# Patient Record
Sex: Male | Born: 1945 | Race: White | Hispanic: No | Smoking: Former smoker
Health system: Southern US, Community
[De-identification: ages and names within clinical notes are randomized; demographics above are authoritative.]

## PROBLEM LIST (undated history)

## (undated) DIAGNOSIS — K219 Gastro-esophageal reflux disease without esophagitis: Secondary | ICD-10-CM

## (undated) DIAGNOSIS — Z8739 Personal history of other diseases of the musculoskeletal system and connective tissue: Secondary | ICD-10-CM

## (undated) DIAGNOSIS — I209 Angina pectoris, unspecified: Secondary | ICD-10-CM

## (undated) DIAGNOSIS — R079 Chest pain, unspecified: Secondary | ICD-10-CM

## (undated) DIAGNOSIS — I2510 Atherosclerotic heart disease of native coronary artery without angina pectoris: Secondary | ICD-10-CM

## (undated) DIAGNOSIS — H3530 Unspecified macular degeneration: Secondary | ICD-10-CM

## (undated) DIAGNOSIS — E7800 Pure hypercholesterolemia, unspecified: Secondary | ICD-10-CM

## (undated) DIAGNOSIS — R0600 Dyspnea, unspecified: Secondary | ICD-10-CM

## (undated) DIAGNOSIS — I314 Cardiac tamponade: Secondary | ICD-10-CM

## (undated) DIAGNOSIS — I214 Non-ST elevation (NSTEMI) myocardial infarction: Secondary | ICD-10-CM

## (undated) DIAGNOSIS — I10 Essential (primary) hypertension: Secondary | ICD-10-CM

## (undated) DIAGNOSIS — I313 Pericardial effusion (noninflammatory): Secondary | ICD-10-CM

## (undated) HISTORY — DX: Cardiac tamponade: I31.4

## (undated) HISTORY — PX: CATARACT EXTRACTION W/ INTRAOCULAR LENS  IMPLANT, BILATERAL: SHX1307

## (undated) HISTORY — PX: CORONARY ANGIOPLASTY: SHX604

---

## 2011-09-20 DIAGNOSIS — R1011 Right upper quadrant pain: Secondary | ICD-10-CM | POA: Diagnosis not present

## 2011-09-20 DIAGNOSIS — R0789 Other chest pain: Secondary | ICD-10-CM | POA: Diagnosis not present

## 2011-09-20 DIAGNOSIS — Z1389 Encounter for screening for other disorder: Secondary | ICD-10-CM | POA: Diagnosis not present

## 2011-09-20 DIAGNOSIS — Z13220 Encounter for screening for lipoid disorders: Secondary | ICD-10-CM | POA: Diagnosis not present

## 2011-09-20 DIAGNOSIS — I10 Essential (primary) hypertension: Secondary | ICD-10-CM | POA: Diagnosis not present

## 2011-09-24 DIAGNOSIS — R1011 Right upper quadrant pain: Secondary | ICD-10-CM | POA: Diagnosis not present

## 2011-10-14 DIAGNOSIS — R1031 Right lower quadrant pain: Secondary | ICD-10-CM | POA: Diagnosis not present

## 2011-10-14 DIAGNOSIS — Z1211 Encounter for screening for malignant neoplasm of colon: Secondary | ICD-10-CM | POA: Diagnosis not present

## 2011-11-05 DIAGNOSIS — Z1211 Encounter for screening for malignant neoplasm of colon: Secondary | ICD-10-CM | POA: Diagnosis not present

## 2011-11-05 DIAGNOSIS — K7689 Other specified diseases of liver: Secondary | ICD-10-CM | POA: Diagnosis not present

## 2011-11-05 DIAGNOSIS — Z87891 Personal history of nicotine dependence: Secondary | ICD-10-CM | POA: Diagnosis not present

## 2011-11-05 DIAGNOSIS — R933 Abnormal findings on diagnostic imaging of other parts of digestive tract: Secondary | ICD-10-CM | POA: Diagnosis not present

## 2011-11-05 DIAGNOSIS — I10 Essential (primary) hypertension: Secondary | ICD-10-CM | POA: Diagnosis not present

## 2011-11-05 DIAGNOSIS — R935 Abnormal findings on diagnostic imaging of other abdominal regions, including retroperitoneum: Secondary | ICD-10-CM | POA: Diagnosis not present

## 2011-11-05 DIAGNOSIS — R945 Abnormal results of liver function studies: Secondary | ICD-10-CM | POA: Diagnosis not present

## 2011-11-05 DIAGNOSIS — Z79899 Other long term (current) drug therapy: Secondary | ICD-10-CM | POA: Diagnosis not present

## 2011-11-05 DIAGNOSIS — E7800 Pure hypercholesterolemia, unspecified: Secondary | ICD-10-CM | POA: Diagnosis not present

## 2011-11-05 DIAGNOSIS — K5730 Diverticulosis of large intestine without perforation or abscess without bleeding: Secondary | ICD-10-CM | POA: Diagnosis not present

## 2011-12-19 DIAGNOSIS — E782 Mixed hyperlipidemia: Secondary | ICD-10-CM | POA: Diagnosis not present

## 2011-12-19 DIAGNOSIS — J449 Chronic obstructive pulmonary disease, unspecified: Secondary | ICD-10-CM | POA: Diagnosis not present

## 2011-12-19 DIAGNOSIS — Z23 Encounter for immunization: Secondary | ICD-10-CM | POA: Diagnosis not present

## 2011-12-19 DIAGNOSIS — Z1389 Encounter for screening for other disorder: Secondary | ICD-10-CM | POA: Diagnosis not present

## 2011-12-19 DIAGNOSIS — I10 Essential (primary) hypertension: Secondary | ICD-10-CM | POA: Diagnosis not present

## 2012-01-01 LAB — HM COLONOSCOPY

## 2012-03-25 DIAGNOSIS — I10 Essential (primary) hypertension: Secondary | ICD-10-CM | POA: Diagnosis not present

## 2012-03-25 DIAGNOSIS — J449 Chronic obstructive pulmonary disease, unspecified: Secondary | ICD-10-CM | POA: Diagnosis not present

## 2012-03-25 DIAGNOSIS — Z1389 Encounter for screening for other disorder: Secondary | ICD-10-CM | POA: Diagnosis not present

## 2012-03-25 DIAGNOSIS — E782 Mixed hyperlipidemia: Secondary | ICD-10-CM | POA: Diagnosis not present

## 2012-03-26 DIAGNOSIS — E782 Mixed hyperlipidemia: Secondary | ICD-10-CM | POA: Diagnosis not present

## 2012-03-26 DIAGNOSIS — Z1389 Encounter for screening for other disorder: Secondary | ICD-10-CM | POA: Diagnosis not present

## 2012-03-26 DIAGNOSIS — I10 Essential (primary) hypertension: Secondary | ICD-10-CM | POA: Diagnosis not present

## 2012-03-26 DIAGNOSIS — J449 Chronic obstructive pulmonary disease, unspecified: Secondary | ICD-10-CM | POA: Diagnosis not present

## 2012-05-18 DIAGNOSIS — K5730 Diverticulosis of large intestine without perforation or abscess without bleeding: Secondary | ICD-10-CM | POA: Diagnosis not present

## 2012-07-09 DIAGNOSIS — J449 Chronic obstructive pulmonary disease, unspecified: Secondary | ICD-10-CM | POA: Diagnosis not present

## 2012-07-09 DIAGNOSIS — Z23 Encounter for immunization: Secondary | ICD-10-CM | POA: Diagnosis not present

## 2012-07-09 DIAGNOSIS — E782 Mixed hyperlipidemia: Secondary | ICD-10-CM | POA: Diagnosis not present

## 2012-07-09 DIAGNOSIS — I10 Essential (primary) hypertension: Secondary | ICD-10-CM | POA: Diagnosis not present

## 2013-01-05 DIAGNOSIS — I10 Essential (primary) hypertension: Secondary | ICD-10-CM | POA: Diagnosis not present

## 2013-01-05 DIAGNOSIS — E785 Hyperlipidemia, unspecified: Secondary | ICD-10-CM | POA: Diagnosis not present

## 2013-01-05 DIAGNOSIS — J449 Chronic obstructive pulmonary disease, unspecified: Secondary | ICD-10-CM | POA: Diagnosis not present

## 2013-01-06 DIAGNOSIS — H2700 Aphakia, unspecified eye: Secondary | ICD-10-CM | POA: Diagnosis not present

## 2013-01-13 DIAGNOSIS — E875 Hyperkalemia: Secondary | ICD-10-CM | POA: Diagnosis not present

## 2013-01-13 DIAGNOSIS — I10 Essential (primary) hypertension: Secondary | ICD-10-CM | POA: Diagnosis not present

## 2013-01-27 DIAGNOSIS — E875 Hyperkalemia: Secondary | ICD-10-CM | POA: Diagnosis not present

## 2013-02-11 DIAGNOSIS — I10 Essential (primary) hypertension: Secondary | ICD-10-CM | POA: Diagnosis not present

## 2013-02-11 DIAGNOSIS — M109 Gout, unspecified: Secondary | ICD-10-CM | POA: Diagnosis not present

## 2013-03-15 DIAGNOSIS — M109 Gout, unspecified: Secondary | ICD-10-CM | POA: Diagnosis not present

## 2013-03-15 DIAGNOSIS — I10 Essential (primary) hypertension: Secondary | ICD-10-CM | POA: Diagnosis not present

## 2013-05-10 DIAGNOSIS — I10 Essential (primary) hypertension: Secondary | ICD-10-CM | POA: Diagnosis not present

## 2013-05-10 DIAGNOSIS — E785 Hyperlipidemia, unspecified: Secondary | ICD-10-CM | POA: Diagnosis not present

## 2013-05-10 DIAGNOSIS — J449 Chronic obstructive pulmonary disease, unspecified: Secondary | ICD-10-CM | POA: Diagnosis not present

## 2013-05-10 DIAGNOSIS — M109 Gout, unspecified: Secondary | ICD-10-CM | POA: Diagnosis not present

## 2013-05-11 DIAGNOSIS — Z23 Encounter for immunization: Secondary | ICD-10-CM | POA: Diagnosis not present

## 2013-08-16 DIAGNOSIS — J029 Acute pharyngitis, unspecified: Secondary | ICD-10-CM | POA: Diagnosis not present

## 2013-11-08 DIAGNOSIS — I10 Essential (primary) hypertension: Secondary | ICD-10-CM | POA: Diagnosis not present

## 2013-11-08 DIAGNOSIS — E785 Hyperlipidemia, unspecified: Secondary | ICD-10-CM | POA: Diagnosis not present

## 2013-11-08 DIAGNOSIS — N400 Enlarged prostate without lower urinary tract symptoms: Secondary | ICD-10-CM | POA: Diagnosis not present

## 2013-11-08 DIAGNOSIS — M109 Gout, unspecified: Secondary | ICD-10-CM | POA: Diagnosis not present

## 2013-11-08 DIAGNOSIS — J449 Chronic obstructive pulmonary disease, unspecified: Secondary | ICD-10-CM | POA: Diagnosis not present

## 2013-11-15 DIAGNOSIS — E875 Hyperkalemia: Secondary | ICD-10-CM | POA: Diagnosis not present

## 2014-03-10 DIAGNOSIS — N400 Enlarged prostate without lower urinary tract symptoms: Secondary | ICD-10-CM | POA: Diagnosis not present

## 2014-03-10 DIAGNOSIS — E785 Hyperlipidemia, unspecified: Secondary | ICD-10-CM | POA: Diagnosis not present

## 2014-03-10 DIAGNOSIS — Z6832 Body mass index (BMI) 32.0-32.9, adult: Secondary | ICD-10-CM | POA: Diagnosis not present

## 2014-03-10 DIAGNOSIS — I10 Essential (primary) hypertension: Secondary | ICD-10-CM | POA: Diagnosis not present

## 2014-03-10 DIAGNOSIS — J449 Chronic obstructive pulmonary disease, unspecified: Secondary | ICD-10-CM | POA: Diagnosis not present

## 2014-03-16 DIAGNOSIS — Z23 Encounter for immunization: Secondary | ICD-10-CM | POA: Diagnosis not present

## 2014-08-17 DIAGNOSIS — H2703 Aphakia, bilateral: Secondary | ICD-10-CM | POA: Diagnosis not present

## 2014-09-06 DIAGNOSIS — I10 Essential (primary) hypertension: Secondary | ICD-10-CM | POA: Diagnosis not present

## 2014-09-06 DIAGNOSIS — E785 Hyperlipidemia, unspecified: Secondary | ICD-10-CM | POA: Diagnosis not present

## 2014-09-06 DIAGNOSIS — M109 Gout, unspecified: Secondary | ICD-10-CM | POA: Diagnosis not present

## 2014-09-06 DIAGNOSIS — Z6832 Body mass index (BMI) 32.0-32.9, adult: Secondary | ICD-10-CM | POA: Diagnosis not present

## 2014-09-06 DIAGNOSIS — J449 Chronic obstructive pulmonary disease, unspecified: Secondary | ICD-10-CM | POA: Diagnosis not present

## 2014-09-14 DIAGNOSIS — I10 Essential (primary) hypertension: Secondary | ICD-10-CM | POA: Diagnosis not present

## 2014-09-14 DIAGNOSIS — E785 Hyperlipidemia, unspecified: Secondary | ICD-10-CM | POA: Diagnosis not present

## 2014-09-14 DIAGNOSIS — R079 Chest pain, unspecified: Secondary | ICD-10-CM | POA: Diagnosis not present

## 2014-09-14 DIAGNOSIS — I209 Angina pectoris, unspecified: Secondary | ICD-10-CM | POA: Diagnosis not present

## 2014-09-14 DIAGNOSIS — Z87891 Personal history of nicotine dependence: Secondary | ICD-10-CM | POA: Diagnosis not present

## 2014-10-11 DIAGNOSIS — R079 Chest pain, unspecified: Secondary | ICD-10-CM | POA: Diagnosis not present

## 2014-10-11 DIAGNOSIS — R0602 Shortness of breath: Secondary | ICD-10-CM | POA: Diagnosis not present

## 2014-10-18 DIAGNOSIS — E785 Hyperlipidemia, unspecified: Secondary | ICD-10-CM | POA: Diagnosis not present

## 2014-10-18 DIAGNOSIS — R079 Chest pain, unspecified: Secondary | ICD-10-CM | POA: Diagnosis not present

## 2014-10-18 DIAGNOSIS — I209 Angina pectoris, unspecified: Secondary | ICD-10-CM | POA: Diagnosis not present

## 2015-01-13 DIAGNOSIS — E785 Hyperlipidemia, unspecified: Secondary | ICD-10-CM | POA: Diagnosis not present

## 2015-01-13 DIAGNOSIS — Z6832 Body mass index (BMI) 32.0-32.9, adult: Secondary | ICD-10-CM | POA: Diagnosis not present

## 2015-01-13 DIAGNOSIS — I10 Essential (primary) hypertension: Secondary | ICD-10-CM | POA: Diagnosis not present

## 2015-07-12 DIAGNOSIS — I10 Essential (primary) hypertension: Secondary | ICD-10-CM | POA: Diagnosis not present

## 2015-07-12 DIAGNOSIS — Z23 Encounter for immunization: Secondary | ICD-10-CM | POA: Diagnosis not present

## 2015-07-12 DIAGNOSIS — E785 Hyperlipidemia, unspecified: Secondary | ICD-10-CM | POA: Diagnosis not present

## 2015-07-12 DIAGNOSIS — Z0000 Encounter for general adult medical examination without abnormal findings: Secondary | ICD-10-CM | POA: Diagnosis not present

## 2015-07-12 DIAGNOSIS — Z6832 Body mass index (BMI) 32.0-32.9, adult: Secondary | ICD-10-CM | POA: Diagnosis not present

## 2015-07-19 DIAGNOSIS — E875 Hyperkalemia: Secondary | ICD-10-CM | POA: Diagnosis not present

## 2015-07-19 DIAGNOSIS — Z1211 Encounter for screening for malignant neoplasm of colon: Secondary | ICD-10-CM | POA: Diagnosis not present

## 2015-08-18 DIAGNOSIS — H2703 Aphakia, bilateral: Secondary | ICD-10-CM | POA: Diagnosis not present

## 2015-10-13 DIAGNOSIS — Z6833 Body mass index (BMI) 33.0-33.9, adult: Secondary | ICD-10-CM | POA: Diagnosis not present

## 2015-10-13 DIAGNOSIS — J019 Acute sinusitis, unspecified: Secondary | ICD-10-CM | POA: Diagnosis not present

## 2016-01-10 DIAGNOSIS — E785 Hyperlipidemia, unspecified: Secondary | ICD-10-CM | POA: Diagnosis not present

## 2016-01-10 DIAGNOSIS — M109 Gout, unspecified: Secondary | ICD-10-CM | POA: Diagnosis not present

## 2016-01-10 DIAGNOSIS — N400 Enlarged prostate without lower urinary tract symptoms: Secondary | ICD-10-CM | POA: Diagnosis not present

## 2016-01-10 DIAGNOSIS — I10 Essential (primary) hypertension: Secondary | ICD-10-CM | POA: Diagnosis not present

## 2016-01-10 DIAGNOSIS — Z6830 Body mass index (BMI) 30.0-30.9, adult: Secondary | ICD-10-CM | POA: Diagnosis not present

## 2016-01-10 DIAGNOSIS — J449 Chronic obstructive pulmonary disease, unspecified: Secondary | ICD-10-CM | POA: Diagnosis not present

## 2016-07-09 DIAGNOSIS — E785 Hyperlipidemia, unspecified: Secondary | ICD-10-CM | POA: Diagnosis not present

## 2016-07-09 DIAGNOSIS — I10 Essential (primary) hypertension: Secondary | ICD-10-CM | POA: Diagnosis not present

## 2016-07-09 DIAGNOSIS — N400 Enlarged prostate without lower urinary tract symptoms: Secondary | ICD-10-CM | POA: Diagnosis not present

## 2016-07-10 DIAGNOSIS — Z0000 Encounter for general adult medical examination without abnormal findings: Secondary | ICD-10-CM | POA: Diagnosis not present

## 2016-07-10 DIAGNOSIS — M109 Gout, unspecified: Secondary | ICD-10-CM | POA: Diagnosis not present

## 2016-07-10 DIAGNOSIS — Z23 Encounter for immunization: Secondary | ICD-10-CM | POA: Diagnosis not present

## 2016-07-10 DIAGNOSIS — E782 Mixed hyperlipidemia: Secondary | ICD-10-CM | POA: Diagnosis not present

## 2016-07-10 DIAGNOSIS — N400 Enlarged prostate without lower urinary tract symptoms: Secondary | ICD-10-CM | POA: Diagnosis not present

## 2016-07-10 DIAGNOSIS — I10 Essential (primary) hypertension: Secondary | ICD-10-CM | POA: Diagnosis not present

## 2016-10-07 DIAGNOSIS — H2703 Aphakia, bilateral: Secondary | ICD-10-CM | POA: Diagnosis not present

## 2016-10-07 DIAGNOSIS — H3530 Unspecified macular degeneration: Secondary | ICD-10-CM | POA: Diagnosis not present

## 2016-10-31 DIAGNOSIS — H353132 Nonexudative age-related macular degeneration, bilateral, intermediate dry stage: Secondary | ICD-10-CM | POA: Diagnosis not present

## 2017-01-03 DIAGNOSIS — N400 Enlarged prostate without lower urinary tract symptoms: Secondary | ICD-10-CM | POA: Diagnosis not present

## 2017-01-03 DIAGNOSIS — I10 Essential (primary) hypertension: Secondary | ICD-10-CM | POA: Diagnosis not present

## 2017-01-03 DIAGNOSIS — E785 Hyperlipidemia, unspecified: Secondary | ICD-10-CM | POA: Diagnosis not present

## 2017-01-07 DIAGNOSIS — I10 Essential (primary) hypertension: Secondary | ICD-10-CM | POA: Diagnosis not present

## 2017-01-07 DIAGNOSIS — M1A00X0 Idiopathic chronic gout, unspecified site, without tophus (tophi): Secondary | ICD-10-CM | POA: Diagnosis not present

## 2017-01-07 DIAGNOSIS — J449 Chronic obstructive pulmonary disease, unspecified: Secondary | ICD-10-CM | POA: Diagnosis not present

## 2017-01-07 DIAGNOSIS — K219 Gastro-esophageal reflux disease without esophagitis: Secondary | ICD-10-CM | POA: Diagnosis not present

## 2017-01-07 DIAGNOSIS — E782 Mixed hyperlipidemia: Secondary | ICD-10-CM | POA: Diagnosis not present

## 2017-01-07 DIAGNOSIS — R072 Precordial pain: Secondary | ICD-10-CM | POA: Diagnosis not present

## 2017-01-30 DIAGNOSIS — H353132 Nonexudative age-related macular degeneration, bilateral, intermediate dry stage: Secondary | ICD-10-CM | POA: Diagnosis not present

## 2017-02-05 ENCOUNTER — Ambulatory Visit (INDEPENDENT_AMBULATORY_CARE_PROVIDER_SITE_OTHER): Payer: Medicare Other | Admitting: Cardiovascular Disease

## 2017-02-05 ENCOUNTER — Encounter: Payer: Self-pay | Admitting: Cardiovascular Disease

## 2017-02-05 VITALS — BP 162/87 | HR 65 | Ht 71.0 in | Wt 235.2 lb

## 2017-02-05 DIAGNOSIS — R0609 Other forms of dyspnea: Secondary | ICD-10-CM | POA: Diagnosis not present

## 2017-02-05 DIAGNOSIS — R079 Chest pain, unspecified: Secondary | ICD-10-CM | POA: Diagnosis not present

## 2017-02-05 DIAGNOSIS — I10 Essential (primary) hypertension: Secondary | ICD-10-CM | POA: Diagnosis not present

## 2017-02-05 DIAGNOSIS — I209 Angina pectoris, unspecified: Secondary | ICD-10-CM | POA: Insufficient documentation

## 2017-02-05 HISTORY — DX: Other forms of dyspnea: R06.09

## 2017-02-05 MED ORDER — LISINOPRIL 5 MG PO TABS: 5.0000 mg | ORAL_TABLET | Freq: Every day | ORAL | 6 refills | Status: DC

## 2017-02-05 NOTE — Assessment & Plan Note (Signed)
History of essential hypertension with blood pressure today 162/87. He is on metoprolol. He says his blood pressure usually runs in this range. I am going add lisinopril 5 mg a day.

## 2017-02-05 NOTE — Assessment & Plan Note (Signed)
History of progressive dyspnea on exertion over the last year. He is nonsmoker. We'll check a 2-D echocardiogram

## 2017-02-05 NOTE — Patient Instructions (Signed)
Medication Instructions: START Lisinopril 5 mg daily.   Labwork: I will call Dr. Blanch Media office to request your recent labs.  Testing/Procedures: Your physician has requested that you have an echocardiogram. Echocardiography is a painless test that uses sound waves to create images of your heart. It provides your doctor with information about the size and shape of your heart and how well your heart's chambers and valves are working. This procedure takes approximately one hour. There are no restrictions for this procedure.  Your physician has requested that you have a lexiscan myoview. For further information please visit [email or web address]. Please follow instruction sheet, as given.  Follow-Up: Your physician recommends that you schedule a follow-up appointment with Dr. Gwenlyn Found after testing.  If you need a refill on your cardiac medications before your next appointment, please call your pharmacy.

## 2017-02-05 NOTE — Progress Notes (Signed)
02/05/2017 Joseph Mercado   [birth year]  [id number]  Primary Physician Abigail Miyamoto, MD Primary Cardiologist: Runell Gess MD Roseanne Reno  HPI:  Joseph Mercado is a very pleasant 71 year old moderately overweight divorced Caucasian male father of one child who is accompanied by his friend Joseph Mercado today. He was referred by Dr. Marina Goodell in [facility] for cardiovascular evaluation because of chest pain and shortness of breath. His risk factors include family history a brother who had bypass surgery age 50 and treated hypertension. He quit smoking 35 years ago. He is retired Music therapist. He did have a stress test 2 years ago which apparently was normal. Over the last 2 years she had progressive dyspnea and exertional chest pain as well as nocturnal chest pain.   Current Outpatient Prescriptions  Medication Sig Dispense Refill  . metoprolol tartrate (LOPRESSOR) 50 MG tablet     . nitroGLYCERIN (NITROSTAT) 0.4 MG SL tablet     . pantoprazole (PROTONIX) 40 MG tablet      No current facility-administered medications for this visit.     Not on File  Social History   Social History  . Marital status: Single    Spouse name: N/A  . Number of children: N/A  . Years of education: N/A   Occupational History  . Not on file.   Social History Main Topics  . Smoking status: Never Smoker  . Smokeless tobacco: Never Used  . Alcohol use Not on file  . Drug use: Unknown  . Sexual activity: Not on file   Other Topics Concern  . Not on file   Social History Narrative  . No narrative on file     Review of Systems: General: negative for chills, fever, night sweats or weight changes.  Cardiovascular: negative for chest pain, dyspnea on exertion, edema, orthopnea, palpitations, paroxysmal nocturnal dyspnea or shortness of breath Dermatological: negative for rash Respiratory: negative for cough or wheezing Urologic: negative for hematuria Abdominal: negative for nausea, vomiting,  diarrhea, bright red blood per rectum, melena, or hematemesis Neurologic: negative for visual changes, syncope, or dizziness All other systems reviewed and are otherwise negative except as noted above.    Blood pressure (!) 162/87, pulse 65, height 5\' 11"  (1.803 m), weight 235 lb 3.2 oz (106.7 kg).  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 I personally reviewed this EKG.  ASSESSMENT AND PLAN:   Essential hypertension History of essential hypertension with blood pressure today 162/87. He is on metoprolol. He says his blood pressure usually runs in this range. I am going add lisinopril 5 mg a day.  Dyspnea on exertion History of progressive dyspnea on exertion over the last year. He is nonsmoker. We'll check a 2-D echocardiogram  Chest pain History of both nocturnal chest pain when recumbent as well as exertional chest pain. Positive progressive over the last 1-2 years. He has had negative stress test several years ago but is unable to exercise. Will get a pharmacologic Myoview stress test to further evaluate.      Runell Gess MD FACP,FACC,FAHA, [facility] 02/05/2017 11:44 AM

## 2017-02-05 NOTE — Assessment & Plan Note (Signed)
History of both nocturnal chest pain when recumbent as well as exertional chest pain. Positive progressive over the last 1-2 years. He has had negative stress test several years ago but is unable to exercise. Will get a pharmacologic Myoview stress test to further evaluate.

## 2017-02-16 DIAGNOSIS — R079 Chest pain, unspecified: Secondary | ICD-10-CM | POA: Insufficient documentation

## 2017-02-16 DIAGNOSIS — I214 Non-ST elevation (NSTEMI) myocardial infarction: Secondary | ICD-10-CM

## 2017-02-16 HISTORY — DX: Chest pain, unspecified: R07.9

## 2017-02-16 HISTORY — DX: Non-ST elevation (NSTEMI) myocardial infarction: I21.4

## 2017-02-18 ENCOUNTER — Telehealth: Payer: Self-pay | Admitting: Cardiovascular Disease

## 2017-02-18 ENCOUNTER — Other Ambulatory Visit (HOSPITAL_COMMUNITY): Payer: Medicare Other

## 2017-02-18 ENCOUNTER — Inpatient Hospital Stay (HOSPITAL_COMMUNITY): Admission: RE | Admit: 2017-02-18 | Payer: Medicare Other | Source: Ambulatory Visit

## 2017-02-18 NOTE — Telephone Encounter (Signed)
Received records from [facility] for appointment on 03/14/17 with Dr Gwenlyn Found.  Records put with Dr Kennon Holter schedule for 03/14/17. lp

## 2017-02-21 ENCOUNTER — Ambulatory Visit: Payer: Medicare Other | Admitting: Cardiovascular Disease

## 2017-02-25 ENCOUNTER — Telehealth (HOSPITAL_COMMUNITY): Payer: Self-pay

## 2017-02-25 NOTE — Telephone Encounter (Signed)
Encounter complete. 

## 2017-02-26 ENCOUNTER — Ambulatory Visit (HOSPITAL_COMMUNITY)
Admission: RE | Admit: 2017-02-26 | Discharge: 2017-02-26 | Disposition: A | Discharge status: Home / Self Care | Payer: Medicare Other | Source: Ambulatory Visit | Attending: Cardiology | Admitting: Cardiology

## 2017-02-26 ENCOUNTER — Other Ambulatory Visit: Payer: Self-pay

## 2017-02-26 ENCOUNTER — Ambulatory Visit (HOSPITAL_BASED_OUTPATIENT_CLINIC_OR_DEPARTMENT_OTHER): Payer: Medicare Other

## 2017-02-26 DIAGNOSIS — R0609 Other forms of dyspnea: Secondary | ICD-10-CM

## 2017-02-26 DIAGNOSIS — Z8249 Family history of ischemic heart disease and other diseases of the circulatory system: Secondary | ICD-10-CM | POA: Diagnosis not present

## 2017-02-26 DIAGNOSIS — R079 Chest pain, unspecified: Secondary | ICD-10-CM | POA: Diagnosis not present

## 2017-02-26 DIAGNOSIS — Z87891 Personal history of nicotine dependence: Secondary | ICD-10-CM | POA: Insufficient documentation

## 2017-02-26 DIAGNOSIS — I10 Essential (primary) hypertension: Secondary | ICD-10-CM | POA: Diagnosis not present

## 2017-02-26 MED ORDER — TECHNETIUM TC 99M TETROFOSMIN IV KIT
10.2000 | PACK | Freq: Once | INTRAVENOUS | Status: AC | PRN
  Administered 2017-02-26: 10.2 via INTRAVENOUS
  Filled 2017-02-26: qty 11

## 2017-02-26 MED ORDER — REGADENOSON 0.4 MG/5ML IV SOLN
0.4000 mg | Freq: Once | INTRAVENOUS | Status: AC
  Administered 2017-02-26: 0.4 mg via INTRAVENOUS

## 2017-02-26 MED ORDER — TECHNETIUM TC 99M TETROFOSMIN IV KIT
31.9000 | PACK | Freq: Once | INTRAVENOUS | Status: AC | PRN
  Administered 2017-02-26: 31.9 via INTRAVENOUS
  Filled 2017-02-26: qty 32

## 2017-02-27 LAB — MYOCARDIAL PERFUSION IMAGING
CHL CUP RESTING HR STRESS: 57 {beats}/min
LVDIAVOL: 107 mL (ref 62–150)
LVSYSVOL: 42 mL
NUC STRESS TID: 1.13
Peak HR: 78 {beats}/min
SDS: 3
SRS: 10
SSS: 13

## 2017-03-10 ENCOUNTER — Observation Stay (HOSPITAL_COMMUNITY)
Admission: AD | Admit: 2017-03-10 | Discharge: 2017-03-11 | Disposition: A | Discharge status: Home / Self Care | Payer: Medicare Other | Source: Other Acute Inpatient Hospital | Attending: Cardiovascular Disease | Admitting: Cardiovascular Disease

## 2017-03-10 ENCOUNTER — Encounter (HOSPITAL_COMMUNITY): Payer: Self-pay | Admitting: General Practice

## 2017-03-10 ENCOUNTER — Encounter (HOSPITAL_COMMUNITY)
Admission: AD | Disposition: A | Discharge status: Home / Self Care | Payer: Self-pay | Source: Other Acute Inpatient Hospital | Attending: Cardiovascular Disease

## 2017-03-10 DIAGNOSIS — E663 Overweight: Secondary | ICD-10-CM | POA: Diagnosis not present

## 2017-03-10 DIAGNOSIS — Z6832 Body mass index (BMI) 32.0-32.9, adult: Secondary | ICD-10-CM | POA: Insufficient documentation

## 2017-03-10 DIAGNOSIS — K219 Gastro-esophageal reflux disease without esophagitis: Secondary | ICD-10-CM | POA: Diagnosis not present

## 2017-03-10 DIAGNOSIS — I10 Essential (primary) hypertension: Secondary | ICD-10-CM | POA: Insufficient documentation

## 2017-03-10 DIAGNOSIS — E782 Mixed hyperlipidemia: Secondary | ICD-10-CM

## 2017-03-10 DIAGNOSIS — F1010 Alcohol abuse, uncomplicated: Secondary | ICD-10-CM | POA: Insufficient documentation

## 2017-03-10 DIAGNOSIS — I2510 Atherosclerotic heart disease of native coronary artery without angina pectoris: Secondary | ICD-10-CM | POA: Insufficient documentation

## 2017-03-10 DIAGNOSIS — I119 Hypertensive heart disease without heart failure: Secondary | ICD-10-CM

## 2017-03-10 DIAGNOSIS — I249 Acute ischemic heart disease, unspecified: Secondary | ICD-10-CM | POA: Diagnosis not present

## 2017-03-10 DIAGNOSIS — I214 Non-ST elevation (NSTEMI) myocardial infarction: Principal | ICD-10-CM | POA: Insufficient documentation

## 2017-03-10 DIAGNOSIS — H3530 Unspecified macular degeneration: Secondary | ICD-10-CM | POA: Diagnosis not present

## 2017-03-10 DIAGNOSIS — Z87891 Personal history of nicotine dependence: Secondary | ICD-10-CM | POA: Insufficient documentation

## 2017-03-10 DIAGNOSIS — R079 Chest pain, unspecified: Secondary | ICD-10-CM | POA: Diagnosis not present

## 2017-03-10 DIAGNOSIS — E785 Hyperlipidemia, unspecified: Secondary | ICD-10-CM

## 2017-03-10 DIAGNOSIS — R0602 Shortness of breath: Secondary | ICD-10-CM | POA: Diagnosis not present

## 2017-03-10 DIAGNOSIS — Z8249 Family history of ischemic heart disease and other diseases of the circulatory system: Secondary | ICD-10-CM | POA: Insufficient documentation

## 2017-03-10 HISTORY — PX: LEFT HEART CATH AND CORONARY ANGIOGRAPHY: CATH118249

## 2017-03-10 HISTORY — DX: Unspecified macular degeneration: H35.30

## 2017-03-10 HISTORY — DX: Chest pain, unspecified: R07.9

## 2017-03-10 HISTORY — PX: CORONARY BALLOON ANGIOPLASTY: CATH118233

## 2017-03-10 HISTORY — DX: Essential (primary) hypertension: I10

## 2017-03-10 HISTORY — DX: Gastro-esophageal reflux disease without esophagitis: K21.9

## 2017-03-10 HISTORY — DX: Dyspnea, unspecified: R06.00

## 2017-03-10 HISTORY — DX: Atherosclerotic heart disease of native coronary artery without angina pectoris: I25.10

## 2017-03-10 LAB — TROPONIN I
Troponin I: 2.02 ng/mL (ref <0.03)
Troponin I: 1.4 ng/mL (ref <0.03)

## 2017-03-10 SURGERY — LEFT HEART CATH AND CORONARY ANGIOGRAPHY
Anesthesia: LOCAL

## 2017-03-10 MED ORDER — LISINOPRIL 5 MG PO TABS
5.0000 mg | ORAL_TABLET | Freq: Every day | ORAL | Status: DC
  Administered 2017-03-10 – 2017-03-11 (×2): 5 mg via ORAL
  Filled 2017-03-10 (×2): qty 1

## 2017-03-10 MED ORDER — NITROGLYCERIN 1 MG/10 ML FOR IR/CATH LAB
INTRA_ARTERIAL | Status: AC
  Filled 2017-03-10: qty 10

## 2017-03-10 MED ORDER — SODIUM CHLORIDE 0.9 % IV SOLN: 250.0000 mL | INTRAVENOUS | Status: DC | PRN

## 2017-03-10 MED ORDER — MORPHINE SULFATE (PF) 4 MG/ML IV SOLN: 2.0000 mg | INTRAVENOUS | Status: DC | PRN

## 2017-03-10 MED ORDER — IOPAMIDOL (ISOVUE-370) INJECTION 76%
INTRAVENOUS | Status: AC
  Filled 2017-03-10: qty 50

## 2017-03-10 MED ORDER — MIDAZOLAM HCL 2 MG/2ML IJ SOLN
INTRAMUSCULAR | Status: DC | PRN
  Administered 2017-03-10: 1 mg via INTRAVENOUS

## 2017-03-10 MED ORDER — SODIUM CHLORIDE 0.9 % IV SOLN
INTRAVENOUS | Status: DC
  Administered 2017-03-10: 17:00:00 via INTRAVENOUS

## 2017-03-10 MED ORDER — TICAGRELOR 90 MG PO TABS
ORAL_TABLET | ORAL | Status: DC | PRN
  Administered 2017-03-10: 180 mg via ORAL

## 2017-03-10 MED ORDER — SODIUM CHLORIDE 0.9% FLUSH: 3.0000 mL | Freq: Two times a day (BID) | INTRAVENOUS | Status: DC

## 2017-03-10 MED ORDER — ASPIRIN 81 MG PO CHEW: 81.0000 mg | CHEWABLE_TABLET | ORAL | Status: DC

## 2017-03-10 MED ORDER — FENTANYL CITRATE (PF) 100 MCG/2ML IJ SOLN
INTRAMUSCULAR | Status: DC | PRN
  Administered 2017-03-10: 25 ug via INTRAVENOUS

## 2017-03-10 MED ORDER — HEART ATTACK BOUNCING BOOK
Freq: Once | Status: AC
  Administered 2017-03-10: 21:00:00
  Filled 2017-03-10: qty 1

## 2017-03-10 MED ORDER — ACETAMINOPHEN 325 MG PO TABS
650.0000 mg | ORAL_TABLET | ORAL | Status: DC | PRN
  Administered 2017-03-10: 650 mg via ORAL
  Filled 2017-03-10: qty 2

## 2017-03-10 MED ORDER — IOPAMIDOL (ISOVUE-370) INJECTION 76%
INTRAVENOUS | Status: AC
  Filled 2017-03-10: qty 100

## 2017-03-10 MED ORDER — SODIUM CHLORIDE 0.9% FLUSH
3.0000 mL | Freq: Two times a day (BID) | INTRAVENOUS | Status: DC
  Administered 2017-03-10: 3 mL via INTRAVENOUS

## 2017-03-10 MED ORDER — HEPARIN (PORCINE) IN NACL 2-0.9 UNIT/ML-% IJ SOLN
INTRAMUSCULAR | Status: AC
Start: 2017-03-10 — End: ?
  Filled 2017-03-10: qty 1000

## 2017-03-10 MED ORDER — HEPARIN BOLUS VIA INFUSION
4000.0000 [IU] | Freq: Once | INTRAVENOUS | Status: AC
  Administered 2017-03-10: 4000 [IU] via INTRAVENOUS
  Filled 2017-03-10: qty 4000

## 2017-03-10 MED ORDER — LABETALOL HCL 5 MG/ML IV SOLN: 10.0000 mg | INTRAVENOUS | Status: AC | PRN

## 2017-03-10 MED ORDER — ACTIVE PARTNERSHIP FOR HEALTH OF YOUR HEART BOOK
Freq: Once | Status: AC
  Administered 2017-03-10: 21:00:00
  Filled 2017-03-10: qty 1

## 2017-03-10 MED ORDER — ONDANSETRON HCL 4 MG/2ML IJ SOLN: 4.0000 mg | Freq: Four times a day (QID) | INTRAMUSCULAR | Status: DC | PRN

## 2017-03-10 MED ORDER — LIDOCAINE HCL 1 % IJ SOLN
INTRAMUSCULAR | Status: AC
  Filled 2017-03-10: qty 20

## 2017-03-10 MED ORDER — ACETAMINOPHEN 325 MG PO TABS: 650.0000 mg | ORAL_TABLET | ORAL | Status: DC | PRN

## 2017-03-10 MED ORDER — METOPROLOL TARTRATE 25 MG PO TABS
25.0000 mg | ORAL_TABLET | Freq: Two times a day (BID) | ORAL | Status: DC
  Administered 2017-03-10 – 2017-03-11 (×3): 25 mg via ORAL
  Filled 2017-03-10 (×3): qty 1

## 2017-03-10 MED ORDER — SODIUM CHLORIDE 0.9 % IV SOLN
INTRAVENOUS | Status: DC
  Administered 2017-03-10: 13:00:00 via INTRAVENOUS

## 2017-03-10 MED ORDER — LIDOCAINE HCL (PF) 1 % IJ SOLN
INTRAMUSCULAR | Status: DC | PRN
  Administered 2017-03-10: 2 mL

## 2017-03-10 MED ORDER — SODIUM CHLORIDE 0.9 % IV SOLN
INTRAVENOUS | Status: DC | PRN
  Administered 2017-03-10 (×2): 1.75 mg/kg/h via INTRAVENOUS

## 2017-03-10 MED ORDER — BIVALIRUDIN TRIFLUOROACETATE 250 MG IV SOLR
INTRAVENOUS | Status: AC
  Filled 2017-03-10: qty 250

## 2017-03-10 MED ORDER — IOPAMIDOL (ISOVUE-370) INJECTION 76%
INTRAVENOUS | Status: DC | PRN
  Administered 2017-03-10: 190 mL via INTRA_ARTERIAL

## 2017-03-10 MED ORDER — MIDAZOLAM HCL 2 MG/2ML IJ SOLN
INTRAMUSCULAR | Status: AC
  Filled 2017-03-10: qty 2

## 2017-03-10 MED ORDER — ASPIRIN 81 MG PO CHEW: 81.0000 mg | CHEWABLE_TABLET | Freq: Every day | ORAL | Status: DC

## 2017-03-10 MED ORDER — TICAGRELOR 90 MG PO TABS
90.0000 mg | ORAL_TABLET | Freq: Two times a day (BID) | ORAL | Status: DC
  Administered 2017-03-11 (×2): 90 mg via ORAL
  Filled 2017-03-10 (×2): qty 1

## 2017-03-10 MED ORDER — NITROGLYCERIN 0.4 MG SL SUBL: 0.4000 mg | SUBLINGUAL_TABLET | SUBLINGUAL | Status: DC | PRN

## 2017-03-10 MED ORDER — HEPARIN (PORCINE) IN NACL 100-0.45 UNIT/ML-% IJ SOLN
1200.0000 [IU]/h | INTRAMUSCULAR | Status: DC
  Administered 2017-03-10: 1200 [IU]/h via INTRAVENOUS
  Filled 2017-03-10: qty 250

## 2017-03-10 MED ORDER — SODIUM CHLORIDE 0.9% FLUSH: 3.0000 mL | INTRAVENOUS | Status: DC | PRN

## 2017-03-10 MED ORDER — VERAPAMIL HCL 2.5 MG/ML IV SOLN
INTRAVENOUS | Status: DC | PRN
  Administered 2017-03-10 (×2): 5 mL via INTRA_ARTERIAL

## 2017-03-10 MED ORDER — HYDRALAZINE HCL 20 MG/ML IJ SOLN: 5.0000 mg | INTRAMUSCULAR | Status: AC | PRN

## 2017-03-10 MED ORDER — HEPARIN (PORCINE) IN NACL 100-0.45 UNIT/ML-% IJ SOLN
1300.0000 [IU]/h | INTRAMUSCULAR | Status: DC
  Administered 2017-03-10: 1200 [IU]/h via INTRAVENOUS
  Filled 2017-03-10: qty 250

## 2017-03-10 MED ORDER — HEPARIN (PORCINE) IN NACL 2-0.9 UNIT/ML-% IJ SOLN
INTRAMUSCULAR | Status: AC | PRN
  Administered 2017-03-10: 1000 mL

## 2017-03-10 MED ORDER — FENTANYL CITRATE (PF) 100 MCG/2ML IJ SOLN
INTRAMUSCULAR | Status: AC
  Filled 2017-03-10: qty 2

## 2017-03-10 MED ORDER — PANTOPRAZOLE SODIUM 40 MG PO TBEC
40.0000 mg | DELAYED_RELEASE_TABLET | Freq: Two times a day (BID) | ORAL | Status: DC
  Administered 2017-03-10 – 2017-03-11 (×3): 40 mg via ORAL
  Filled 2017-03-10 (×3): qty 1

## 2017-03-10 MED ORDER — NITROGLYCERIN IN D5W 200-5 MCG/ML-% IV SOLN
0.0000 ug/min | INTRAVENOUS | Status: DC
  Administered 2017-03-10: 17:00:00 25 ug/min via INTRAVENOUS

## 2017-03-10 MED ORDER — HEPARIN SODIUM (PORCINE) 1000 UNIT/ML IJ SOLN
INTRAMUSCULAR | Status: DC | PRN
  Administered 2017-03-10: 5000 [IU] via INTRAVENOUS

## 2017-03-10 MED ORDER — VERAPAMIL HCL 2.5 MG/ML IV SOLN
INTRAVENOUS | Status: AC
  Filled 2017-03-10: qty 2

## 2017-03-10 MED ORDER — BIVALIRUDIN BOLUS VIA INFUSION - CUPID
INTRAVENOUS | Status: DC | PRN
  Administered 2017-03-10: 79.125 mg via INTRAVENOUS

## 2017-03-10 MED ORDER — ASPIRIN EC 81 MG PO TBEC
81.0000 mg | DELAYED_RELEASE_TABLET | Freq: Every day | ORAL | Status: DC
  Administered 2017-03-11: 81 mg via ORAL
  Filled 2017-03-10: qty 1

## 2017-03-10 MED ORDER — TICAGRELOR 90 MG PO TABS
ORAL_TABLET | ORAL | Status: AC
  Filled 2017-03-10: qty 2

## 2017-03-10 SURGICAL SUPPLY — 19 items
BALLN SAPPHIRE 2.0X12 (BALLOONS) ×2
BALLOON SAPPHIRE 2.0X12 (BALLOONS) ×1 IMPLANT
CATH INFINITI 5FR ANG PIGTAIL (CATHETERS) ×2 IMPLANT
CATH OPTITORQUE TIG 4.0 5F (CATHETERS) ×2 IMPLANT
CATH VISTA GUIDE 6FR XB3.5 (CATHETERS) ×2 IMPLANT
DEVICE RAD COMP TR BAND LRG (VASCULAR PRODUCTS) ×2 IMPLANT
GLIDESHEATH SLEND A-KIT 6F 22G (SHEATH) ×2 IMPLANT
GUIDEWIRE INQWIRE 1.5J.035X260 (WIRE) ×1 IMPLANT
INQWIRE 1.5J .035X260CM (WIRE) ×2
KIT ENCORE 26 ADVANTAGE (KITS) ×2 IMPLANT
KIT HEART LEFT (KITS) ×2 IMPLANT
PACK CARDIAC CATHETERIZATION (CUSTOM PROCEDURE TRAY) ×2 IMPLANT
SYR MEDRAD MARK V 150ML (SYRINGE) ×2 IMPLANT
TRANSDUCER W/STOPCOCK (MISCELLANEOUS) ×2 IMPLANT
TUBING CIL FLEX 10 FLL-RA (TUBING) ×2 IMPLANT
WIRE ASAHI FIELDER XT 190CM (WIRE) ×2 IMPLANT
WIRE ASAHI PROWATER 180CM (WIRE) ×2 IMPLANT
WIRE FIGHTER CROSSING 190CM (WIRE) ×2 IMPLANT
WIRE HI TORQ VERSACORE-J 145CM (WIRE) ×2 IMPLANT

## 2017-03-10 NOTE — Progress Notes (Signed)
ANTICOAGULATION CONSULT NOTE - Initial Consult  Pharmacy Consult for Heparin  Indication: chest pain/ACS  No Known Allergies  Patient Measurements: Height: 5\' 11"  (180.3 cm) Weight: 232 lb 9.6 oz (105.5 kg) IBW/kg (Calculated) : 75.3 Heparin Dosing Weight: 97.5 kg   Vital Signs: Temp: 97.8 F (36.6 C) (07/23 0925) Temp Source: Oral (07/23 0925) BP: 145/81 (07/23 0925)  Labs: No results for input(s): HGB, HCT, PLT, APTT, LABPROT, INR, HEPARINUNFRC, HEPRLOWMOCWT, CREATININE, CKTOTAL, CKMB, TROPONINI in the last 72 hours.  CrCl cannot be calculated (No order found.).   Medical History: Past Medical History:  Diagnosis Date  . Chest pain 02/2017  . Dyspnea   . GERD (gastroesophageal reflux disease)   . Hypertension   . Macular degeneration     Medications:  Scheduled:  . [START ON 03/11/2017] aspirin EC  81 mg Oral Daily  . [START ON 03/11/2017] lisinopril  5 mg Oral Daily  . metoprolol tartrate  25 mg Oral BID  . pantoprazole  40 mg Oral BID    Assessment:  Pharmacy is consulted to start heparin on patient that transferred from outside facility. Patient is being evaluated for chest pain, rule out ACS. CBC is stable from evaluation of labs from outside facility. No signs or symptoms of bleeding noted.   Goal of Therapy:  Heparin level 0.3-0.7 units/ml Monitor platelets by anticoagulation protocol: Yes   Plan:  Give 4000 units bolus x 1 Start heparin infusion at 1200 units/hr Continue to monitor H&H and platelets  Jalene Mullet, Pharm.D. PGY1 Pharmacy Resident 03/10/2017 11:55 AM Main Pharmacy: [phone] Pager: [phone]

## 2017-03-10 NOTE — H&P (Signed)
History & Physical    Patient ID: Joseph Mercado MRN: [id number], DOB/AGE: 09/08/1945   Admit date: 03/10/2017  Primary Physician: Abigail Miyamoto, MD Primary Cardiologist: Dr. Allyson Sabal   History of Present Illness    Joseph Mercado is a 71 y.o. male with past medical history of HTN and GERD who presents to [facility] on 03/10/2017 as a transfer from [facility] for evaluation of chest pain.  He was recently referred to Dr. Allyson Sabal and evaluated on 02/05/2017 for episodes of chest pain and dyspnea on exertion. BP was elevated at 162/87 during his visit, therefore he was continued on Lopressor 25mg  BID with Lisinopril 5mg  daily being added to his medication regimen. An echocardiogram was recommended to evaluate for an etiology of his dyspnea and this showed an EF of 55-60% with Grade 2 DD and a trivial pericardial effusion with global longitudinal strain of -18.3%. A Lexiscan Myoview was also obtained on 7/11 and showed inferior (basal) and inferolateral (base, mid) thinning with mild improvement in recovery which was thought to reflect soft tissue attenuation.   He presented to [facility] on 03/10/2017 for evaluation of recurrent chest pain. Says his pain usually presents upon lying down and awakes him from sleep at times. Last night, he was asleep but awoke with a 10/10 pressure, saying it was more intense than his previous events. Reports associated dyspnea and nausea.   He was given a GI cocktail with improvement in his pain but this has represented. Says it is a 2-3/10 at this time.   He denies any prior cardiac history. Has known HTN and HLD (history of statin intolerance). Reports his brother had CABG in his 75's. The patient is a former smoker, having quit 30+ years ago. Does consume 2-3 beers per night, sometimes binging and consuming 7-8. No recreational drug use.   Initial labs shows WBC of 8.6, Hgb 17.0, platelets 127, PT 10.7, PTT 27.3,  INR 1.1, Na+ 132, K+ 4.3, creatinine  0.80. Initial Troponin 0.02 with repeat of 0.94. Pro-BNP 599. CXR showed cardiomegaly without overt pulmonary edema and aortic atherosclerosis.    Past Medical History    Past Medical History:  Diagnosis Date  . Chest pain 02/2017  . Dyspnea   . GERD (gastroesophageal reflux disease)   . Hypertension   . Macular degeneration     Past Surgical History:  Procedure Laterality Date  . CATARACT EXTRACTION, BILATERAL       Allergies  No Known Allergies   Home Medications    Prior to Admission medications   Medication Sig Start Date End Date Taking? Authorizing Provider  lisinopril (PRINIVIL,ZESTRIL) 5 MG tablet Take 1 tablet (5 mg total) by mouth daily. 02/05/17 05/06/17 Yes Runell Gess, MD  metoprolol tartrate (LOPRESSOR) 50 MG tablet Take 25 mg by mouth 2 (two) times daily.  01/27/17  Yes [provider]  pantoprazole (PROTONIX) 40 MG tablet Take 40 mg by mouth 2 (two) times daily.  01/07/17  Yes [provider]  sildenafil (REVATIO) 20 MG tablet Take 100 mg by mouth as needed (prior to intercourse).   Yes [provider]  nitroGLYCERIN (NITROSTAT) 0.4 MG SL tablet Place 0.4 mg under the tongue every 5 (five) minutes as needed for chest pain.  01/07/17   [provider]    Family History    Family History  Problem Relation Age of Onset  . CAD Brother        CABG in his 27's  Social History    Social History   Social History  . Marital status: Single    Spouse name: N/A  . Number of children: N/A  . Years of education: N/A   Occupational History  . Not on file.   Social History Main Topics  . Smoking status: Former Games developer  . Smokeless tobacco: Never Used  . Alcohol use 1.2 oz/week    2 Cans of beer per week     Comment: daily  . Drug use: No  . Sexual activity: Not on file   Other Topics Concern  . Not on file   Social History Narrative  . No narrative on file     Review of Systems    General:  No chills,  fever, night sweats or weight changes.  Cardiovascular:  No edema, orthopnea, palpitations, paroxysmal nocturnal dyspnea. Positive for chest pain and dyspnea on exertion.  Dermatological: No rash, lesions/masses Respiratory: No cough, dyspnea Urologic: No hematuria, dysuria Abdominal:   No nausea, vomiting, diarrhea, bright red blood per rectum, melena, or hematemesis Neurologic:  No visual changes, wkns, changes in mental status. All other systems reviewed and are otherwise negative except as noted above.  Physical Exam    Blood pressure (!) 145/81, temperature 97.8 F (36.6 C), temperature source Oral, resp. rate 12, height 5\' 11"  (1.803 m), weight 232 lb 9.6 oz (105.5 kg), SpO2 96 %.  General: Well developed, overweight Caucasian male appearing in no acute distress. Head: Normocephalic, atraumatic, sclera non-icteric, no xanthomas, nares are without discharge. Dentition:  Neck: No carotid bruits. JVD not elevated.  Lungs: Respirations regular and unlabored, without wheezes or rales.  Heart: Regular rate and rhythm. No S3 or S4.  No murmur, no rubs, or gallops appreciated. Abdomen: Soft, non-tender, non-distended with normoactive bowel sounds. No hepatomegaly. No rebound/guarding. No obvious abdominal masses. Msk:  Strength and tone appear normal for age. No joint deformities or effusions. Extremities: No clubbing or cyanosis. No lower extremity edema.  Distal pedal pulses are 2+ bilaterally. Neuro: Alert and oriented X 3. Moves all extremities spontaneously. No focal deficits noted. Psych:  Responds to questions appropriately with a normal affect. Skin: No rashes or lesions noted  Labs    Labs: 03/10/2017 (Checked at [facility]) WBC 8.6 Hgb 17.0 Platelets 127 PT 10.7 PTT 27.3 INR 1.1 Na+ 132 K+ 4.3 Creatinine 0.80 Initial Troponin 0.02 with repeat of 0.94.  Pro-BNP 599.  Radiology Studies    No results found.  EKG & Cardiac Imaging    EKG:   NSR, HR 75, with  LAD. - Personally Reviewed  ECHOCARDIOGRAM: 02/26/2017 Study Conclusions  - Left ventricle: There was moderate concentric hypertrophy.   Systolic function was normal. The estimated ejection fraction was   in the range of 55% to 60%. Features are consistent with a   pseudonormal left ventricular filling pattern, with concomitant   abnormal relaxation and increased filling pressure (grade 2   diastolic dysfunction). Doppler parameters are consistent with   high ventricular filling pressure. - Left atrium: The atrium was mildly dilated. - Right ventricle: The cavity size was normal. Wall thickness was   normal. Systolic function was normal. - Tricuspid valve: There was trivial regurgitation. - Pulmonary arteries: Systolic pressure was within the normal   range. PA peak pressure: 27 mm Hg (S). - Pericardium, extracardiac: A trivial pericardial effusion was   identified. - Global longitudinal strain -18.3%.  NST: 02/27/2017  Inferior (basal) and inferolateral (base, mid) thinning with mild improvement in  recovery Does not appear to be significant for ischemia probably reflects soft tissue attenuation (attenuation)  Nuclear stress EF: 61%. Low risk scan  Assessment & Plan    1. NSTEMI (non-ST elevated myocardial infarction) (HCC) - he has been evaluated in the office for recurrent chest pain. Recent echo showed a preserved EF of 55-60% with Grade 2 DD and Myoview was also obtained on 7/11 and showed inferior (basal) and inferolateral (base, mid) thinning with mild improvement in recovery which was thought to reflect soft tissue attenuation.  - he has continued to have recurrent episodes of dyspnea on exertion and chest pain at rest, which usually awakes him from sleep at night. Developed an intense episode last night which was not relived with OTC antiacids. At [facility], initial troponin was 0.02 with repeat of 0.94. Continue to trend troponin values.  - discussed with Dr. Tenny Craw. With his  recent NST, continued chest pain, and elevated cardiac enzymes, will plan for a cardiac catheterization later today. The patient understands that risks include but are not limited to stroke (1 in 1000), death (1 in 1000), kidney failure [usually temporary] (1 in 500), bleeding (1 in 200), allergic reaction [possibly serious] (1 in 200).  - start Heparin. Continue ASA, BB, and IV NTG. Will check FLP.  2. HTN - BP is at 145/81 on initial check. - continue Lopressor 25mg  BID. Will hold Lisinopril until after tomorrow in anticipation of cardiac cath.   3. HLD - reports a history of this but has been intolerant to statins in the past.  - recheck FLP. If noted to have CAD, will need to re-challenge statin therapy.   4. 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 Monitor for evidence of withdrawal. Was encouraged to limit consumption to less than 2 beers per day.    Signed, Ellsworth Lennox, PA-C 03/10/2017, 11:35 AM Pager: [phone]   Pt seen and examined  I agree with findings as noted by B Strader above  Pt is a 71 yo who is followedd by Erlene Quan  REcently underwent eval for CP  Myovue did not suggest signif ischemia   Woke up with CP  Went to ED in [place].   Symptoms eased off after GI cocktail   Initial Trop 0.02  REpeat 0.94   Pt transferred to [facility].  Currently pt has mild discomfort    On exam:  Neck:  JVP norma  Lungs Clear to auscultation  Cardiac exam:  RRR  No S3  Ext without edema  I have discussed lab findings with pt  With symptoms and bump in troponin I would recomm L heart cath to define anatomy  Possible intervention  Risks/benefits explained  Pt understands and agrees to proceed.    Dietrich Pates

## 2017-03-10 NOTE — Progress Notes (Signed)
I have discussed with Dr. Martinique about timing of intervention in the circumflex. Because of the heavy radiation dose he prefers to get the patient several weeks as an outpatient on medical therapy prior to re-intervention. 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   Lorretta Harp, M.D., [place], [facility], [place] [address] [place], [place]  [id number]  [phone] 03/10/2017 5:29 PM

## 2017-03-10 NOTE — Progress Notes (Signed)
Patient received from cath lab 1715.  As per report, right forearm showed evidence of an IV infiltrate.  Swollen, reddened area 12.5 X 12 cm, arm circumference 33.5 cm.  Arm kept elevated and ice applied. Kerin Ransom, [place] examined patient.

## 2017-03-10 NOTE — Progress Notes (Signed)
CRITICAL VALUE ALERT  Critical Value:  Troponin 2.02  Date & Time Notied:  03/10/17 1230  Provider Notified: B. Strader, PA   Orders Received/Actions taken: Heparin gtt was started and continue to monitor pt.  On for Cath lab this afternoon.  Pt is NPO.

## 2017-03-10 NOTE — Progress Notes (Signed)
ANTICOAGULATION CONSULT NOTE  Pharmacy Consult for Heparin  Indication: chest pain/ACS  No Known Allergies  Patient Measurements: Height: 5\' 11"  (180.3 cm) Weight: 232 lb 9.6 oz (105.5 kg) IBW/kg (Calculated) : 75.3 Heparin Dosing Weight: 97.5 kg   Vital Signs: Temp: 97.8 F (36.6 C) (07/23 0925) Temp Source: Oral (07/23 0925) BP: 151/84 (07/23 1658) Pulse Rate: 77 (07/23 1703)  Labs:  Recent Labs  03/10/17 1132  TROPONINI 2.02*    CrCl cannot be calculated (No order found.).   Medical History: Past Medical History:  Diagnosis Date  . Chest pain 02/2017  . Dyspnea   . GERD (gastroesophageal reflux disease)   . Hypertension   . Macular degeneration     Medications:  Scheduled:  . [START ON 03/11/2017] aspirin EC  81 mg Oral Daily  . lisinopril  5 mg Oral Daily  . metoprolol tartrate  25 mg Oral BID  . pantoprazole  40 mg Oral BID  . ticagrelor  90 mg Oral BID    Assessment:  Pharmacy is consulted to start heparin on patient that transferred from outside facility. Patient is being evaluated for chest pain, rule out ACS. CBC is stable from evaluation of labs from outside facility. No signs or symptoms of bleeding noted.   Patient is now s/p cath lab, planning staged procedure.  Pharmacy asked to resume IV heparin 4 hrs after sheath out.  Goal of Therapy:  Heparin level 0.3-0.7 units/ml Monitor platelets by anticoagulation protocol: Yes   Plan:  Resume IV heparin at 1200 units/hr at 2100 pm. Check heparin level 6 hrs after gtt starts. Daily heparin level and CBC.  Uvaldo Rising, BCPS  Clinical Pharmacist Pager [phone]  03/10/2017 5:20 PM

## 2017-03-10 NOTE — Interval H&P Note (Signed)
Cath Lab Visit (complete for each Cath Lab visit)  Clinical Evaluation Leading to the Procedure:   ACS: Yes.    Non-ACS:    Anginal Classification: CCS III  Anti-ischemic medical therapy: Minimal Therapy (1 class of medications)  Non-Invasive Test Results: Low-risk stress test findings: cardiac mortality <1%/year  Prior CABG: No previous CABG      History and Physical Interval Note:  03/10/2017 3:05 PM  Joseph Mercado  has presented today for surgery, with the diagnosis of nstemi  The various methods of treatment have been discussed with the patient and family. After consideration of risks, benefits and other options for treatment, the patient has consented to  Procedure(s): Left Heart Cath and Coronary Angiography (N/A) as a surgical intervention .  The patient's history has been reviewed, patient examined, no change in status, stable for surgery.  I have reviewed the patient's chart and labs.  Questions were answered to the patient's satisfaction.     Quay Burow

## 2017-03-11 ENCOUNTER — Encounter (HOSPITAL_COMMUNITY): Payer: Self-pay | Admitting: Cardiovascular Disease

## 2017-03-11 ENCOUNTER — Encounter (HOSPITAL_COMMUNITY)
Admission: AD | Disposition: A | Discharge status: Home / Self Care | Payer: Self-pay | Source: Other Acute Inpatient Hospital | Attending: Cardiovascular Disease

## 2017-03-11 DIAGNOSIS — I2510 Atherosclerotic heart disease of native coronary artery without angina pectoris: Secondary | ICD-10-CM

## 2017-03-11 DIAGNOSIS — I214 Non-ST elevation (NSTEMI) myocardial infarction: Secondary | ICD-10-CM | POA: Diagnosis not present

## 2017-03-11 DIAGNOSIS — I10 Essential (primary) hypertension: Secondary | ICD-10-CM | POA: Diagnosis not present

## 2017-03-11 DIAGNOSIS — I119 Hypertensive heart disease without heart failure: Secondary | ICD-10-CM

## 2017-03-11 DIAGNOSIS — E782 Mixed hyperlipidemia: Secondary | ICD-10-CM

## 2017-03-11 DIAGNOSIS — E785 Hyperlipidemia, unspecified: Secondary | ICD-10-CM

## 2017-03-11 DIAGNOSIS — F1010 Alcohol abuse, uncomplicated: Secondary | ICD-10-CM | POA: Diagnosis not present

## 2017-03-11 HISTORY — DX: Hypertensive heart disease without heart failure: I11.9

## 2017-03-11 HISTORY — DX: Mixed hyperlipidemia: E78.2

## 2017-03-11 LAB — CBC
HCT: 38.4 % — ABNORMAL LOW (ref 39.0–52.0)
HEMOGLOBIN: 13.4 g/dL (ref 13.0–17.0)
MCH: 31.2 pg (ref 26.0–34.0)
MCHC: 34.9 g/dL (ref 30.0–36.0)
MCV: 89.5 fL (ref 78.0–100.0)
PLATELETS: 139 10*3/uL — AB (ref 150–400)
RBC: 4.29 MIL/uL (ref 4.22–5.81)
RDW: 13.3 % (ref 11.5–15.5)
WBC: 11.6 10*3/uL — ABNORMAL HIGH (ref 4.0–10.5)

## 2017-03-11 LAB — BASIC METABOLIC PANEL
ANION GAP: 7 (ref 5–15)
BUN: 6 mg/dL (ref 6–20)
CALCIUM: 8.6 mg/dL — AB (ref 8.9–10.3)
CO2: 26 mmol/L (ref 22–32)
CREATININE: 1.01 mg/dL (ref 0.61–1.24)
Chloride: 99 mmol/L — ABNORMAL LOW (ref 101–111)
Glucose, Bld: 119 mg/dL — ABNORMAL HIGH (ref 65–99)
Potassium: 4.3 mmol/L (ref 3.5–5.1)
Sodium: 132 mmol/L — ABNORMAL LOW (ref 135–145)

## 2017-03-11 LAB — LIPID PANEL
CHOLESTEROL: 148 mg/dL (ref 0–200)
HDL: 45 mg/dL (ref >40)
LDL CALC: 89 mg/dL (ref 0–99)
TRIGLYCERIDES: 72 mg/dL (ref <150)
Total CHOL/HDL Ratio: 3.3 RATIO
VLDL: 14 mg/dL (ref 0–40)

## 2017-03-11 LAB — HEPARIN LEVEL (UNFRACTIONATED): HEPARIN UNFRACTIONATED: 0.24 [IU]/mL — AB (ref 0.30–0.70)

## 2017-03-11 LAB — POCT ACTIVATED CLOTTING TIME: Activated Clotting Time: 560 seconds

## 2017-03-11 SURGERY — CORONARY STENT INTERVENTION
Anesthesia: LOCAL

## 2017-03-11 MED ORDER — ASPIRIN 81 MG PO TBEC: 81.0000 mg | DELAYED_RELEASE_TABLET | Freq: Every day | ORAL | Status: DC

## 2017-03-11 MED ORDER — EZETIMIBE 10 MG PO TABS: 10.0000 mg | ORAL_TABLET | Freq: Every day | ORAL | 6 refills | Status: DC

## 2017-03-11 MED ORDER — EZETIMIBE 10 MG PO TABS
10.0000 mg | ORAL_TABLET | Freq: Every day | ORAL | Status: DC
  Administered 2017-03-11: 12:00:00 10 mg via ORAL
  Filled 2017-03-11: qty 1

## 2017-03-11 MED ORDER — TICAGRELOR 90 MG PO TABS: 90.0000 mg | ORAL_TABLET | Freq: Two times a day (BID) | ORAL | 10 refills | Status: DC

## 2017-03-11 MED ORDER — EZETIMIBE 10 MG PO TABS: 10.0000 mg | ORAL_TABLET | Freq: Every day | ORAL | Status: DC

## 2017-03-11 MED ORDER — TICAGRELOR 90 MG PO TABS: 90.0000 mg | ORAL_TABLET | Freq: Two times a day (BID) | ORAL | 0 refills | Status: DC

## 2017-03-11 NOTE — Care Management Note (Signed)
Case Management Note  Patient Details  Name: Joseph Mercado MRN: [id number] Date of Birth: [birth year]  Subjective/Objective:    From home, s/p Coronary Balloon Angioplasty, will be on brilinta.   NCM awaiting benefit check for brilinta.               Action/Plan: NCM will follow for dc needs.   Expected Discharge Date:                  Expected Discharge Plan:  Home/Self Care  In-House Referral:     Discharge planning Services  CM Consult  Post Acute Care Choice:    Choice offered to:     DME Arranged:    DME Agency:     HH Arranged:    [place] Agency:     Status of Service:  Completed, signed off  If discussed at [employer] of Stay Meetings, dates discussed:    Additional Comments:  Zenon Mayo, RN 03/11/2017, 7:44 AM

## 2017-03-11 NOTE — Progress Notes (Addendum)
[place] for Heparin  Indication: chest pain/ACS  No Known Allergies  Patient Measurements: Height: 5\' 11"  (180.3 cm) Weight: 233 lb 11 oz (106 kg) IBW/kg (Calculated) : 75.3 Heparin Dosing Weight: 97.5 kg   Vital Signs: Temp: 98.8 F (37.1 C) (07/24 0200) Temp Source: Oral (07/24 0200) BP: 128/52 (07/24 0200) Pulse Rate: 83 (07/24 0200)  Labs:  Recent Labs  03/10/17 1132 03/10/17 1848 03/11/17 0214  HGB  --   --  13.4  HCT  --   --  38.4*  PLT  --   --  139*  HEPARINUNFRC  --   --  0.24*  TROPONINI 2.02* 1.40*  --     CrCl cannot be calculated (No order found.).   Medical History: Past Medical History:  Diagnosis Date  . Chest pain 02/2017  . Dyspnea   . GERD (gastroesophageal reflux disease)   . Hypertension   . Macular degeneration     Medications:  Scheduled:  . aspirin EC  81 mg Oral Daily  . lisinopril  5 mg Oral Daily  . metoprolol tartrate  25 mg Oral BID  . pantoprazole  40 mg Oral BID  . ticagrelor  90 mg Oral BID   Assessment:  Pharmacy is consulted to start heparin on patient that transferred from outside facility. Patient is being evaluated for chest pain, rule out ACS. CBC is stable from evaluation of labs from outside facility. Patient is now s/p cath lab, planning staged procedure.   Heparin level subtherapeutic: 0.24 - PLTs low 139; no bleeding or infusion issues per RN  Goal of Therapy:  Heparin level 0.3-0.7 units/ml Monitor platelets by anticoagulation protocol: Yes   Plan:  Increase heparin gtt to 1300 units/hr Check heparin level in 8 hrs Daily heparin level and CBC.  Georga Bora, PharmD Clinical Pharmacist 03/11/2017 3:33 AM

## 2017-03-11 NOTE — Progress Notes (Signed)
Progress Note  Patient Name: Joseph Mercado Date of Encounter: 03/11/2017  Primary Cardiologist: Gwenlyn Found    Subjective   No CP  No SOB    Inpatient Medications    Scheduled Meds: . aspirin EC  81 mg Oral Daily  . lisinopril  5 mg Oral Daily  . metoprolol tartrate  25 mg Oral BID  . pantoprazole  40 mg Oral BID  . ticagrelor  90 mg Oral BID   Continuous Infusions: . sodium chloride     PRN Meds: sodium chloride, acetaminophen, morphine injection, nitroGLYCERIN, ondansetron (ZOFRAN) IV, sodium chloride flush   Vital Signs    Vitals:   03/11/17 0700 03/11/17 0728 03/11/17 0800 03/11/17 0850  BP: 117/73 138/72 124/68 (!) 145/78  Pulse:  76    Resp: 20 (!) 23 (!) 23 20  Temp:  98.4 F (36.9 C)    TempSrc:  Oral    SpO2: 96% 95%    Weight:      Height:        Intake/Output Summary (Last 24 hours) at 03/11/17 1040 Last data filed at 03/11/17 0700  Gross per 24 hour  Intake          1905.43 ml  Output             1500 ml  Net           405.43 ml   Filed Weights   03/10/17 0925 03/11/17 0200  Weight: 232 lb 9.6 oz (105.5 kg) 233 lb 11 oz (106 kg)    Telemetry    SR  - Personally Reviewed  ECG      Physical Exam   GEN: No acute distress.   Neck: No JVD Cardiac: RRR, no murmurs, rubs, or gallops.  Respiratory: Clear to auscultation bilaterally. GI: Soft, nontender, non-distended  MS: No edema; No deformity.  R forearm with swelling (IV infiltration)   Neuro:  Nonfocal  Psych: Normal affect   Labs    Chemistry Recent Labs Lab 03/11/17 0214  NA 132*  K 4.3  CL 99*  CO2 26  GLUCOSE 119*  BUN 6  CREATININE 1.01  CALCIUM 8.6*  GFRNONAA >60  GFRAA >60  ANIONGAP 7     Hematology Recent Labs Lab 03/11/17 0214  WBC 11.6*  RBC 4.29  HGB 13.4  HCT 38.4*  MCV 89.5  MCH 31.2  MCHC 34.9  RDW 13.3  PLT 139*    Cardiac Enzymes Recent Labs Lab 03/10/17 1132 03/10/17 1848  TROPONINI 2.02* 1.40*   No results for input(s): TROPIPOC in the  last 168 hours.   BNPNo results for input(s): BNP, PROBNP in the last 168 hours.   DDimer No results for input(s): DDIMER in the last 168 hours.   Radiology    No results found.  Cardiac Studies     Patient Profile    Assessment & Plan    1  CAD  Cath yesteday by Adora Fridge  Pt underwent PCI of OM  He was unable to cross occluded LCx   Given amount of fluoro it was recomm that this be done after  Several wks to avoid acute radiation injury .  (Pt would prefer sooner) Pt now off heparin and NTG  Ambulated wihtout pain On Brilinta   OK for d/c  Will arrange for outpt f/u prior to intervention    2   HTN  BP OK   3HL Intolerant to statins    LDL 89  HDL 45  4  EtOH abuse  Will need to be counselled over time    WIll arrange for f/u with plan for second procedure in a few wks   Signed, Dorris Carnes, MD  03/11/2017, 10:40 AM

## 2017-03-11 NOTE — Care Management Obs Status (Signed)
[place] NOTIFICATION   Patient Details  Name: Joseph Mercado MRN: [id number] Date of Birth: February 25, 1946   Medicare Observation Status Notification Given:  Yes    Zenon Mayo, RN 03/11/2017, 1:29 PM

## 2017-03-11 NOTE — Progress Notes (Signed)
CARDIAC REHAB PHASE I   PRE:  Rate/Rhythm: 82 SR  BP:  Supine: 145/78  Sitting:   Standing:    SaO2: 98%RA  MODE:  Ambulation: 500 ft   POST:  Rate/Rhythm: 95 SR  BP:  Supine:   Sitting: 155/70  Standing:    SaO2: 98%RA 0850-0940 Pt walked 500 ft with steady gait and no CP. Tolerated well. MI ed completed with pt who voiced understanding. Stressed importance of brililnta. Needs to see case manager. Reviewed NTG use, MI restrictions, heart healthy diet, cutting down alcohol consumption, and CRP 2. Will refer to [facility] program but pt unable to attend until staged procedure. Encouraged light walking but did not give ex ed as he needs staged PCI. Will follow up when pt returns.   Graylon Good, RN BSN  03/11/2017 9:35 AM

## 2017-03-11 NOTE — Progress Notes (Signed)
8. S/W  [facility] @ [facility] # [phone]   BRILINTA 90 MG BID   COVER- YES  CO-PAY- $ 351.99 DEDUCTIBLE NOT MET / Q/L 2 PILLS PER DAY  TIER- 3 DRUG  PRIOR APPROVAL NO   PREFERRED PHARMACY : [facility]

## 2017-03-11 NOTE — Discharge Summary (Signed)
Discharge Summary    Patient ID: Joseph Mercado,  MRN: [id number], DOB/AGE: 71-Nov-1947 71 y.o.  Admit date: 03/10/2017 Discharge date: 03/11/2017  Primary Care Provider: Abigail Miyamoto Primary Cardiologist: Dr. Allyson Sabal   Discharge Diagnoses    Principal Problem:   NSTEMI (non-ST elevated myocardial infarction) [facility]) Active Problems:   Essential hypertension   CAD (coronary artery disease)   Hyperlipidemia LDL goal <70   History of Present Illness     Joseph Mercado is a 71 y.o. male with past medical history of HTN and GERD who presented to [facility] on 03/10/2017 as a transfer from [facility] for evaluation of chest pain.  He was recently referred to Dr. Allyson Sabal and evaluated on 02/05/2017 for episodes of chest pain and dyspnea on exertion. BP was elevated at 162/87 during his visit, therefore he was continued on Lopressor 25mg  BID with Lisinopril 5mg  daily being added to his medication regimen. An echocardiogram was recommended to evaluate for an etiology of his dyspnea and this showed an EF of 55-60% with Grade 2 DD and a trivial pericardial effusion with global longitudinal strain of -18.3%. A Lexiscan Myoview was also obtained on 7/11 and showed inferior (basal) and inferolateral (base, mid) thinning with mild improvement in recovery which was thought to reflect soft tissue attenuation.   He presented to [facility] on 03/10/2017 for evaluation of recurrent chest pain. Said his pain usually presented upon lying down and awakes him from sleep at times but he developed a 10/10 pressure the night of his presentation, saying it was more intense than his previous events. He was given a GI cocktail with improvement in his pain but pain represented.  Initial labs showed WBC of 8.6, Hgb 17.0, platelets 127, PT 10.7, PTT 27.3,  INR 1.1, Na+ 132, K+ 4.3, creatinine 0.80. Initial Troponin 0.02 with repeat of 0.94. Pro-BNP 599. CXR showed cardiomegaly without overt pulmonary edema  and aortic atherosclerosis.   Hospital Course     Consultants: None  With his recent NST, continued chest pain, and elevated cardiac enzymes, a cardiac catheterization was recommended for definitive evaluation. This was performed and showed 100% Prox Cx stenosis with 95% Mid Cx stenosis and 45% mid-LAD stenosis. Unsuccessful attempt at crossing an occluded mid nondominant circumflex into the AV groove continuation of the posterior lateral branch was performed. It was initially recommended to attempt LCx intervention the following day, but due to his fluoroscopy time and contrast use, Dr. Swaziland recommended trying medical therapy prior to re-intervention with plans for potential CTO intervention in several weeks.    The following morning, he denied any recurrent chest pain. Telemetry showed no acute abnormalities. He was started on ASA and Brilinta (30-day card provided). He may need to be switched to Plavix as an outpatient following his 30-day card as his co-pay is $351.99. He was last examined by Dr. Tenny Craw and deemed stable for discharge. Discussed with Dr. Allyson Sabal, will arrange follow-up with Dr. Swaziland so CTO can be discussed. A staff message was sent to see if this can be scheduled sooner, but for now is scheduled for 04/03/2017. He was discharged home in stable condition.   _____________  Discharge Vitals Blood pressure (!) 168/77, pulse 84, temperature 98.3 F (36.8 C), temperature source Oral, resp. rate (!) 26, height 5\' 11"  (1.803 m), weight 233 lb 11 oz (106 kg), SpO2 97 %.  Filed Weights   03/10/17 0925 03/11/17 0200  Weight: 232 lb 9.6 oz (105.5 kg) 233 lb  11 oz (106 kg)    Labs & Radiologic Studies     CBC  Recent Labs  03/11/17 0214  WBC 11.6*  HGB 13.4  HCT 38.4*  MCV 89.5  PLT 139*   Basic Metabolic Panel  Recent Labs  03/11/17 0214  NA 132*  K 4.3  CL 99*  CO2 26  GLUCOSE 119*  BUN 6  CREATININE 1.01  CALCIUM 8.6*   Liver Function Tests No results for  input(s): AST, ALT, ALKPHOS, BILITOT, PROT, ALBUMIN in the last 72 hours. No results for input(s): LIPASE, AMYLASE in the last 72 hours. Cardiac Enzymes  Recent Labs  03/10/17 1132 03/10/17 1848  TROPONINI 2.02* 1.40*   BNP Invalid input(s): POCBNP D-Dimer No results for input(s): DDIMER in the last 72 hours. Hemoglobin A1C No results for input(s): HGBA1C in the last 72 hours. Fasting Lipid Panel  Recent Labs  03/11/17 0214  CHOL 148  HDL 45  LDLCALC 89  TRIG 72  CHOLHDL 3.3   Thyroid Function Tests No results for input(s): TSH, T4TOTAL, T3FREE, THYROIDAB in the last 72 hours.  Invalid input(s): FREET3  No results found.   Diagnostic Studies/Procedures     Cardiac Catheterization: 03/10/2017  Mid LAD lesion, 45 %stenosed.  Mid Cx lesion, 95 %stenosed.  Prox Cx to Mid Cx lesion, 100 %stenosed.  Post intervention, there is a 95% residual stenosis.  The left ventricular systolic function is normal.  LV end diastolic pressure is normal.  The left ventricular ejection fraction is 55-65% by visual estimate.  PROCEDURE DESCRIPTION:   The patient was brought to the second floor [facility] cath lab in the postabsorptive state. He was  premedicated with Valium 5 mg by mouth, IV Versed and fentanyl. His right wrist was prepped and shaved in usual sterile fashion. Xylocaine 1% was used for local anesthesia. A 6 French sheath was inserted into the right radial  artery using standard Seldinger technique. The patient received 5000 units  of heparin  intravenously.  A 5 [place] TIG catheter and pigtail catheter were used for selective coronary angiography and left ventriculography respectively. Isovue dye was used for the entirety of the case. Retrograde aortic and left ventricular and pullback pressures were recorded. Radial cocktail was administered via the SideArm sheath.  The patient received hydration and 80 mg Brilenta by mouth followed by Angiomax bolus and  infusion with a therapeutic ACT demonstrated. Using a 6 [place] XB 3.5 cm guide catheter along with multiple guidewires including a 014 per water, Fielder XT and a Fighter guidewire I was able to get across the mid AV groove circumflex into the second moderate size marginal branch and angioplasty of the occlusion with a 2 mm x 12 mm balloon. Unfortunately I was unable to redirect the wire and the continuation of the AV groove circumflex in the to the larger third marginal branch. This was collateralized however. The excessive amount of contrast and x-ray dose utilized during the case I aborted the case and was staged intervention tomorrow. Angiomax was turned off. The sheath was removed and a TR band was placed on the wrist to achieve hemostasis. The patient left the lab in stable condition.  IMPRESSION: Unsuccessful attempt at crossing an occluded mid nondominant circumflex into the AV groove continuation of the posterior lateral branch. Patient does have normal LV function. He was pain-free at the end of the case. He was loaded with aspirin and Brilenta. We will restart heparin without a bolus 4 hours after sheath  removal. The plan will be to reattempt circumflex re-intervention tomorrow. The Air Kerma  was 4014 mgr and the fluoroscopytime was 39.6 minutes. Total contrast administered and patient was 190 mL. The ACT was 560.  Nanetta Batty. MD, [facility] 03/10/2017 4:57 PM   Disposition   Pt is being discharged home today in good condition.  Follow-up Plans & Appointments    Follow-up Information    Swaziland, Peter M, MD Follow up on 04/03/2017.   Specialty:  Cardiology Why:  Appointment to discuss future catheterization with Dr. Swaziland. Scheduled for 04/03/2017 at 10:00AM. The office will call you if there are any openings prior to this.  Contact information: [address] [place] [address] [phone]          Discharge Instructions    AMB Referral to Cardiac  Rehabilitation - Phase II    Complete by:  As directed    Diagnosis:  NSTEMI   Amb Referral to Cardiac Rehabilitation    Complete by:  As directed    Referring to [place] Phase 2 for after staged procedure   Diagnosis:  NSTEMI   Diet - low sodium heart healthy    Complete by:  As directed    Discharge instructions    Complete by:  As directed    PLEASE REMEMBER TO BRING ALL OF YOUR MEDICATIONS TO EACH OF YOUR FOLLOW-UP OFFICE VISITS.  PLEASE ATTEND ALL SCHEDULED FOLLOW-UP APPOINTMENTS.   Activity: Increase activity slowly as tolerated. You may shower, but no soaking baths (or swimming) for 1 week. No driving for 1 week. No lifting over 10 lbs for 2 weeks. No sexual activity for 2 weeks.   You May Return to Work: in 1 week (if applicable)  Wound Care: You may wash cath site gently with soap and water. Keep cath site clean and dry. If you notice pain, swelling, bleeding or pus at your cath site, please call [phone].   Increase activity slowly    Complete by:  As directed       Discharge Medications     Medication List    TAKE these medications   aspirin 81 MG EC tablet Take 1 tablet (81 mg total) by mouth daily.   ezetimibe 10 MG tablet Commonly known as:  ZETIA Take 1 tablet (10 mg total) by mouth daily.   lisinopril 5 MG tablet Commonly known as:  PRINIVIL,ZESTRIL Take 1 tablet (5 mg total) by mouth daily.   metoprolol tartrate 50 MG tablet Commonly known as:  LOPRESSOR Take 25 mg by mouth 2 (two) times daily.   nitroGLYCERIN 0.4 MG SL tablet Commonly known as:  NITROSTAT Place 0.4 mg under the tongue every 5 (five) minutes as needed for chest pain.   pantoprazole 40 MG tablet Commonly known as:  PROTONIX Take 40 mg by mouth 2 (two) times daily.   sildenafil 20 MG tablet Commonly known as:  REVATIO Take 100 mg by mouth as needed (prior to intercourse).   ticagrelor 90 MG Tabs tablet Commonly known as:  BRILINTA Take 1 tablet (90 mg total) by mouth 2  (two) times daily.       Aspirin prescribed at discharge?  Yes High Intensity Statin Prescribed? (Lipitor 40-80mg  or Crestor 20-40mg ): No: Statin intolerant - Started on Zetia Beta Blocker Prescribed? Yes For EF 40% or less, Was ACEI/ARB Prescribed? No: EF > 40% ADP Receptor Inhibitor Prescribed? (i.e. Plavix etc.-Includes Medically Managed Patients): Yes For EF <40%, Aldosterone Inhibitor Prescribed? No: EF> 40% Was EF  assessed during THIS hospitalization? Yes Was Cardiac Rehab II ordered? (Included Medically managed Patients): Yes - but will be after his staged PCI   Allergies No Known Allergies   Outstanding Labs/Studies   BMET at follow-up appointment.   Duration of Discharge Encounter   Greater than 30 minutes including physician time.  Signed, Ellsworth Lennox, PA-C 03/11/2017, 12:03 PM

## 2017-03-11 NOTE — Progress Notes (Signed)
TR BAND REMOVAL  LOCATION:    right radial  DEFLATED PER PROTOCOL:    Yes.    TIME BAND OFF / DRESSING APPLIED:    20:30   SITE UPON ARRIVAL:    Level 0  SITE AFTER BAND REMOVAL:    Level 0  CIRCULATION SENSATION AND MOVEMENT:    Within Normal Limits   Yes.    COMMENTS:   Post TR band instructions given. Pt tolerated well. 

## 2017-03-13 ENCOUNTER — Encounter: Payer: Self-pay | Admitting: Cardiology

## 2017-03-13 ENCOUNTER — Ambulatory Visit (INDEPENDENT_AMBULATORY_CARE_PROVIDER_SITE_OTHER): Payer: Medicare Other | Admitting: Cardiology

## 2017-03-13 VITALS — BP 160/92 | HR 76 | Ht 71.0 in | Wt 229.2 lb

## 2017-03-13 DIAGNOSIS — I10 Essential (primary) hypertension: Secondary | ICD-10-CM | POA: Diagnosis not present

## 2017-03-13 DIAGNOSIS — E785 Hyperlipidemia, unspecified: Secondary | ICD-10-CM | POA: Diagnosis not present

## 2017-03-13 DIAGNOSIS — I25110 Atherosclerotic heart disease of native coronary artery with unstable angina pectoris: Secondary | ICD-10-CM | POA: Diagnosis not present

## 2017-03-13 DIAGNOSIS — I200 Unstable angina: Secondary | ICD-10-CM | POA: Diagnosis not present

## 2017-03-13 MED ORDER — CLOPIDOGREL BISULFATE 75 MG PO TABS: 75.0000 mg | ORAL_TABLET | Freq: Every day | ORAL | 3 refills | Status: DC

## 2017-03-13 MED ORDER — LISINOPRIL 20 MG PO TABS: 20.0000 mg | ORAL_TABLET | Freq: Every day | ORAL | 3 refills | Status: DC

## 2017-03-13 NOTE — Patient Instructions (Signed)
We will increase lisinopril to 20 mg daily  Stop Brilinta  Start Plavix 75 mg daily  I will see you back in 6 weeks  We will plan on your intervention on September 26.

## 2017-03-13 NOTE — Progress Notes (Signed)
Cardiology Office Note   Date:  03/13/2017   ID:  Joseph Mercado, DOB [birth year], MRN [id number]  PCP:  Abigail Miyamoto, MD  Cardiologist:  Nanetta Batty MD  Chief Complaint  Patient presents with  . Coronary Artery Disease      History of Present Illness: Joseph Mercado is a 71 y.o. male who is seen at the request of Dr. Allyson Sabal for consideration of CTO PCI. He has a history of HTN and family history of CAD. Apparently had a stress test 2 years ago that was OK. Seen in June by Dr. Allyson Sabal with a several month history of progressive DOE and chest pain on exertion. Echo showed mild LVH with grade 2 diastolic dysfunction. Valves were OK. Myoview study showed inferolateral ischemia. This led to a cardiac cath on 03/10/17 which showed CTO of the LCx with collaterals to a fairly large OM. PCI was attempted but unable to cross completely into the large OM so unable to recanalize the vessel. It was noted that this was a long and difficult procedure with radiation dose of 4 gray. Patient was observed overnight without chest pain and discharged the following day on medical therapy.  On follow up today he denies any chest pain since he left the hospital. His wife notes his BP has been persistently high. He has significant bruising from a hematoma right forearm.     Past Medical History:  Diagnosis Date  . CAD (coronary artery disease)    a. 02/2017: NSTEMI with cath showing 100% Prox Cx stenosis with 95% Mid Cx stenosis and 45% mid-LAD stenosis. Unsuccessful attempt at crossing an occluded mid nondominant circumflex --> plan for staged PCI in coming weeks  . Chest pain 02/2017  . Dyspnea   . GERD (gastroesophageal reflux disease)   . Hypertension   . Macular degeneration     Past Surgical History:  Procedure Laterality Date  . CATARACT EXTRACTION, BILATERAL    . CORONARY BALLOON ANGIOPLASTY N/A 03/10/2017   Procedure: Coronary Balloon Angioplasty;  Surgeon: Runell Gess, MD;  Location:  [facility] INVASIVE CV LAB;  Service: Cardiovascular;  Laterality: N/A;  CFX  . LEFT HEART CATH AND CORONARY ANGIOGRAPHY N/A 03/10/2017   Procedure: Left Heart Cath and Coronary Angiography;  Surgeon: Runell Gess, MD;  Location: [facility] INVASIVE CV LAB;  Service: Cardiovascular;  Laterality: N/A;     Current Outpatient Prescriptions  Medication Sig Dispense Refill  . aspirin EC 81 MG EC tablet Take 1 tablet (81 mg total) by mouth daily.    [name] ezetimibe (ZETIA) 10 MG tablet Take 1 tablet (10 mg total) by mouth daily. 30 tablet 6  . metoprolol tartrate (LOPRESSOR) 50 MG tablet Take 25 mg by mouth 2 (two) times daily.     . Multiple Vitamins-Minerals (PRESERVISION AREDS PO) Take 1 capsule by mouth 2 (two) times daily.    . nitroGLYCERIN (NITROSTAT) 0.4 MG SL tablet Place 0.4 mg under the tongue every 5 (five) minutes as needed for chest pain.     . pantoprazole (PROTONIX) 40 MG tablet Take 40 mg by mouth 2 (two) times daily.     . sildenafil (REVATIO) 20 MG tablet Take 100 mg by mouth as needed (prior to intercourse).    [name] clopidogrel (PLAVIX) 75 MG tablet Take 1 tablet (75 mg total) by mouth daily. 90 tablet 3  . lisinopril (PRINIVIL,ZESTRIL) 20 MG tablet Take 1 tablet (20 mg total) by mouth daily. 90 tablet 3   No current facility-administered  medications for this visit.     Allergies:   Patient has no known allergies.    Social History:  The patient  reports that he has quit smoking. He has never used smokeless tobacco. He reports that he drinks about 1.2 oz of alcohol per week . He reports that he does not use drugs.   Family History:  The patient's family history includes CAD in his brother.    ROS:  Please see the history of present illness.   Otherwise, review of systems are positive for none.   All other systems are reviewed and negative.    PHYSICAL EXAM: VS:  BP (!) 160/92   Pulse 76   Ht 5\' 11"  (1.803 m)   Wt 229 lb 3.2 oz (104 kg)   SpO2 98%   BMI 31.97 kg/m  , BMI Body mass  index is 31.97 kg/m. GEN: Well nourished, well developed, in no acute distress  HEENT: normal  Neck: no JVD, carotid bruits, or masses Cardiac: RRR; no murmurs, rubs, or gallops,no edema  Respiratory:  clear to auscultation bilaterally, normal work of breathing GI: soft, nontender, nondistended, + BS Ext; extensive bruising in right forearm. Radial pulse is 2+  Skin: warm and dry, no rash Neuro:  Strength and sensation are intact Psych: euthymic mood, full affect   EKG:  EKG is not ordered today. The ekg ordered today demonstrates N/A   Recent Labs: 03/11/2017: BUN 6; Creatinine, Ser 1.01; Hemoglobin 13.4; Platelets 139; Potassium 4.3; Sodium 132    Lipid Panel    Component Value Date/Time   CHOL 148 03/11/2017 0214   TRIG 72 03/11/2017 0214   HDL 45 03/11/2017 0214   CHOLHDL 3.3 03/11/2017 0214   VLDL 14 03/11/2017 0214   LDLCALC 89 03/11/2017 0214      Wt Readings from Last 3 Encounters:  03/13/17 229 lb 3.2 oz (104 kg)  03/11/17 233 lb 11 oz (106 kg)  02/26/17 235 lb (106.6 kg)      Other studies Reviewed: Cardiac cath 03/10/17: CARDIAC CATHETERIZATION / PCI LCX    History obtained from chart review.Mr. Werling is a very pleasant 71 year old moderately overweight divorced Caucasian male father of one child who is accompanied by his friend Joseph Mercado today. He was referred by Dr. Marina Goodell in [facility] for cardiovascular evaluation because of chest pain and shortness of breath. His risk factors include family history a brother who had bypass surgery age 63 and treated hypertension. He quit smoking 35 years ago. He is retired Music therapist. He did have a stress test 2 years ago which apparently was normal. Over the last 2 years she had progressive dyspnea and exertional chest pain as well as nocturnal chest pain. He was admitted in transfer from [facility] with chest pain. His troponins increased to 2. He presents now for cardiac catheterization to define his  anatomy.   PROCEDURE DESCRIPTION:   The patient was brought to the second floor [facility]iac cath lab in the postabsorptive state. He was  premedicated with Valium 5 mg by mouth, IV Versed and fentanyl. His right wrist was prepped and shaved in usual sterile fashion. Xylocaine 1% was used for local anesthesia. A 6 French sheath was inserted into the right radial  artery using standard Seldinger technique. The patient received 5000 units  of heparin  intravenously.  A 5 [place] TIG catheter and pigtail catheter were used for selective coronary angiography and left ventriculography respectively. Isovue dye was used for the entirety of the  case. Retrograde aortic and left ventricular and pullback pressures were recorded. Radial cocktail was administered via the SideArm sheath.  The patient received hydration and 80 mg Brilenta by mouth followed by Angiomax bolus and infusion with a therapeutic ACT demonstrated. Using a 6 [place] XB 3.5 cm guide catheter along with multiple guidewires including a 014 per water, Fielder XT and a Fighter guidewire I was able to get across the mid AV groove circumflex into the second moderate size marginal branch and angioplasty of the occlusion with a 2 mm x 12 mm balloon. Unfortunately I was unable to redirect the wire and the continuation of the AV groove circumflex in the to the larger third marginal branch. This was collateralized however. The excessive amount of contrast and x-ray dose utilized during the case I aborted the case and was staged intervention tomorrow. Angiomax was turned off. The sheath was removed and a TR band was placed on the wrist to achieve hemostasis. The patient left the lab in stable condition.    IMPRESSION: Unsuccessful attempt at crossing an occluded mid nondominant circumflex into the AV groove continuation of the posterior lateral branch. Patient does have normal LV function. He was pain-free at the end of the case. He was loaded  with aspirin and Brilenta. We will restart heparin without a bolus 4 hours after sheath removal. The plan will be to reattempt circumflex re-intervention tomorrow. The Air Kerma  was 4014 mgr and the fluoroscopytime was 39.6 minutes. Total contrast administered and patient was 190 mL. The ACT was 560.  Nanetta Batty. MD, [facility] 03/10/2017 4:57 PM  Echo 02/26/17: Study Conclusions  - Left ventricle: There was moderate concentric hypertrophy.   Systolic function was normal. The estimated ejection fraction was   in the range of 55% to 60%. Features are consistent with a   pseudonormal left ventricular filling pattern, with concomitant   abnormal relaxation and increased filling pressure (grade 2   diastolic dysfunction). Doppler parameters are consistent with   high ventricular filling pressure. - Left atrium: The atrium was mildly dilated. - Right ventricle: The cavity size was normal. Wall thickness was   normal. Systolic function was normal. - Tricuspid valve: There was trivial regurgitation. - Pulmonary arteries: Systolic pressure was within the normal   range. PA peak pressure: 27 mm Hg (S). - Pericardium, extracardiac: A trivial pericardial effusion was   identified. - Global longitudinal strain -18.3%.  Myoview 02/26/17: Study Highlights    Inferior (basal) and inferolateral (base, mid) thinning with mild improvement in recovery Does not appear to be significant for ischemia probably reflects soft tissue attenuation (attenuation)  Nuclear stress EF: 61%.  Low risk scan     ASSESSMENT AND PLAN:  1.  CAD with CTO of the LCx supplying one large OM branch. There are left to left collaterals. Patient is symptomatic and has evidence of ischemia on nuclear stress testing. Unsuccessful PCI last week due to inability to cross with wire. I think he is a suitable candidate for CTO PCI of the LCx. Due to recent radiation exposure I would recommend waiting at least 6 weeks to allow  radiation effect to dissipate. In the meantime will continue medical therapy. Continue ASA and Metoprolol. Switch Brilinta to Plavix 75 mg daily. Will increase lisinopril to 20 mg daily for better BP control. I will follow up in 6 weeks and we will tentatively plan on CTO PCI on September 26.    Current medicines are reviewed at length with the patient today.  The patient does not have concerns regarding medicines.  The following changes have been made:  See above  Labs/ tests ordered today include: none No orders of the defined types were placed in this encounter.    Disposition:   FU with me in 6 weeks  Signed, Peter [place], MD  03/13/2017 6:05 PM    [facility] [address], [place], [place], [address] Phone [phone], Fax [phone]

## 2017-03-14 ENCOUNTER — Ambulatory Visit: Payer: Medicare Other | Admitting: Cardiovascular Disease

## 2017-03-26 ENCOUNTER — Telehealth: Payer: Self-pay | Admitting: Cardiology

## 2017-03-26 NOTE — Telephone Encounter (Signed)
New message  Speak with Rutherford Guys about pt going to rehab. She states pt has a artery blockage and would like to further discuss if pt should go through with the rehab. Please call back to discuss

## 2017-03-26 NOTE — Telephone Encounter (Signed)
Returned call to patient he stated he received a call from [facility] cardiac rehab.Advised he has appointment with Dr.Jordan 9/6 to discuss CTO.Advised no cardiac rehab at this time.

## 2017-04-02 ENCOUNTER — Encounter: Payer: Self-pay | Admitting: Cardiology

## 2017-04-03 ENCOUNTER — Ambulatory Visit: Payer: Medicare Other | Admitting: Cardiology

## 2017-04-19 DIAGNOSIS — I313 Pericardial effusion (noninflammatory): Secondary | ICD-10-CM

## 2017-04-19 DIAGNOSIS — I3139 Other pericardial effusion (noninflammatory): Secondary | ICD-10-CM

## 2017-04-19 HISTORY — DX: Pericardial effusion (noninflammatory): I31.3

## 2017-04-19 HISTORY — DX: Other pericardial effusion (noninflammatory): I31.39

## 2017-04-20 NOTE — Progress Notes (Signed)
Cardiology Office Note   Date:  04/24/2017   ID:  Joseph Mercado, DOB February 28, 1946, MRN [id number]  PCP:  Abigail Miyamoto, MD  Cardiologist:  Nanetta Batty MD  Chief Complaint  Patient presents with  . Coronary Artery Disease      History of Present Illness: Joseph Mercado is a 71 y.o. male who is seen at the request of Dr. Allyson Sabal for consideration of CTO PCI. He has a history of HTN and family history of CAD. Apparently had a stress test 2 years ago that was OK. Seen in June by Dr. Allyson Sabal with a several month history of progressive DOE and chest pain on exertion. Echo showed mild LVH with grade 2 diastolic dysfunction. Valves were OK. Myoview study showed inferolateral ischemia. This led to a cardiac cath on 03/10/17 which showed CTO of the LCx with collaterals to a fairly large OM. PCI was attempted but unable to cross completely into the large OM so unable to recanalize the vessel. It was noted that this was a long and difficult procedure with radiation dose of 4 gray.   On follow up today he states he is doing OK. He still has some chest pain from time to time. No skin toxicity from prior cath. No bleeding. History of intolerance to statins.    Past Medical History:  Diagnosis Date  . CAD (coronary artery disease)    a. 02/2017: NSTEMI with cath showing 100% Prox Cx stenosis with 95% Mid Cx stenosis and 45% mid-LAD stenosis. Unsuccessful attempt at crossing an occluded mid nondominant circumflex --> plan for staged PCI in coming weeks  . Chest pain 02/2017  . Dyspnea   . GERD (gastroesophageal reflux disease)   . Hypertension   . Macular degeneration     Past Surgical History:  Procedure Laterality Date  . CATARACT EXTRACTION, BILATERAL    . CORONARY BALLOON ANGIOPLASTY N/A 03/10/2017   Procedure: Coronary Balloon Angioplasty;  Surgeon: Runell Gess, MD;  Location: [facility] INVASIVE CV LAB;  Service: Cardiovascular;  Laterality: N/A;  CFX  . LEFT HEART CATH AND CORONARY  ANGIOGRAPHY N/A 03/10/2017   Procedure: Left Heart Cath and Coronary Angiography;  Surgeon: Runell Gess, MD;  [facility] INVASIVE CV LAB;  Service: Cardiovascular;  Laterality: N/A;     Current Outpatient Prescriptions  Medication Sig Dispense Refill  . aspirin EC 81 MG EC tablet Take 1 tablet (81 mg total) by mouth daily.    . clopidogrel (PLAVIX) 75 MG tablet Take 1 tablet (75 mg total) by mouth daily. 90 tablet 3  . ezetimibe (ZETIA) 10 MG tablet Take 1 tablet (10 mg total) by mouth daily. 30 tablet 6  . lisinopril (PRINIVIL,ZESTRIL) 20 MG tablet Take 1 tablet (20 mg total) by mouth daily. 90 tablet 3  . metoprolol tartrate (LOPRESSOR) 50 MG tablet Take 25 mg by mouth 2 (two) times daily.     . Multiple Vitamins-Minerals (PRESERVISION AREDS PO) Take 1 capsule by mouth 2 (two) times daily.    . nitroGLYCERIN (NITROSTAT) 0.4 MG SL tablet Place 0.4 mg under the tongue every 5 (five) minutes as needed for chest pain.     . pantoprazole (PROTONIX) 40 MG tablet Take 40 mg by mouth 2 (two) times daily.     . sildenafil (REVATIO) 20 MG tablet Take 100 mg by mouth as needed (prior to intercourse).     No current facility-administered medications for this visit.     Allergies:   Patient has no  known allergies.    Social History:  The patient  reports that he has quit smoking. He has never used smokeless tobacco. He reports that he drinks about 1.2 oz of alcohol per week . He reports that he does not use drugs.   Family History:  The patient's family history includes CAD in his brother.    ROS:  Please see the history of present illness.   Otherwise, review of systems are positive for none.   All other systems are reviewed and negative.    PHYSICAL EXAM: VS:  BP (!) 146/83   Pulse 62   Ht 5\' 11"  (1.803 m)   Wt 227 lb 6.4 oz (103.1 kg)   SpO2 97%   BMI 31.72 kg/m  , BMI Body mass index is 31.72 kg/m. GEN: Well nourished, well developed, in no acute distress  HEENT: normal    Neck: no JVD, carotid bruits, or masses Cardiac: RRR; no murmurs, rubs, or gallops,no edema  Respiratory:  clear to auscultation bilaterally, normal work of breathing GI: soft, nontender, nondistended, + BS Ext;  Radial and pedal pulses  2+  Skin: warm and dry, no rash Neuro:  Strength and sensation are intact Psych: euthymic mood, full affect   EKG:  EKG is not ordered today. The ekg ordered today demonstrates N/A   Recent Labs: 03/11/2017: BUN 6; Creatinine, Ser 1.01; Hemoglobin 13.4; Platelets 139; Potassium 4.3; Sodium 132    Lipid Panel    Component Value Date/Time   CHOL 148 03/11/2017 0214   TRIG 72 03/11/2017 0214   HDL 45 03/11/2017 0214   CHOLHDL 3.3 03/11/2017 0214   VLDL 14 03/11/2017 0214   LDLCALC 89 03/11/2017 0214      Wt Readings from Last 3 Encounters:  04/24/17 227 lb 6.4 oz (103.1 kg)  03/13/17 229 lb 3.2 oz (104 kg)  03/11/17 233 lb 11 oz (106 kg)      Other studies Reviewed: Cardiac cath 03/10/17: CARDIAC CATHETERIZATION / PCI LCX    History obtained from chart review.Mr. Corella is a very pleasant 71 year old moderately overweight divorced Caucasian male father of one child who is accompanied by his friend Joseph Mercado today. He was referred by Dr. Marina Goodell in [facility] for cardiovascular evaluation because of chest pain and shortness of breath. His risk factors include family history a brother who had bypass surgery age 8 and treated hypertension. He quit smoking 35 years ago. He is retired Music therapist. He did have a stress test 2 years ago which apparently was normal. Over the last 2 years she had progressive dyspnea and exertional chest pain as well as nocturnal chest pain. He was admitted in transfer from [facility] with chest pain. His troponins increased to 2. He presents now for cardiac catheterization to define his anatomy.   PROCEDURE DESCRIPTION:   The patient was brought to the second floor [facility]iac cath lab in the  postabsorptive state. He was  premedicated with Valium 5 mg by mouth, IV Versed and fentanyl. His right wrist was prepped and shaved in usual sterile fashion. Xylocaine 1% was used for local anesthesia. A 6 French sheath was inserted into the right radial  artery using standard Seldinger technique. The patient received 5000 units  of heparin  intravenously.  A 5 [place] TIG catheter and pigtail catheter were used for selective coronary angiography and left ventriculography respectively. Isovue dye was used for the entirety of the case. Retrograde aortic and left ventricular and pullback pressures were recorded. Radial cocktail  was administered via the SideArm sheath.  The patient received hydration and 80 mg Brilenta by mouth followed by Angiomax bolus and infusion with a therapeutic ACT demonstrated. Using a 6 [place] XB 3.5 cm guide catheter along with multiple guidewires including a 014 per water, Fielder XT and a Fighter guidewire I was able to get across the mid AV groove circumflex into the second moderate size marginal branch and angioplasty of the occlusion with a 2 mm x 12 mm balloon. Unfortunately I was unable to redirect the wire and the continuation of the AV groove circumflex in the to the larger third marginal branch. This was collateralized however. The excessive amount of contrast and x-ray dose utilized during the case I aborted the case and was staged intervention tomorrow. Angiomax was turned off. The sheath was removed and a TR band was placed on the wrist to achieve hemostasis. The patient left the lab in stable condition.    IMPRESSION: Unsuccessful attempt at crossing an occluded mid nondominant circumflex into the AV groove continuation of the posterior lateral branch. Patient does have normal LV function. He was pain-free at the end of the case. He was loaded with aspirin and Brilenta. We will restart heparin without a bolus 4 hours after sheath removal. The plan will be to  reattempt circumflex re-intervention tomorrow. The Air Kerma  was 4014 mgr and the fluoroscopytime was 39.6 minutes. Total contrast administered and patient was 190 mL. The ACT was 560.  Nanetta Batty. MD, [facility] 03/10/2017 4:57 PM  Echo 02/26/17: Study Conclusions  - Left ventricle: There was moderate concentric hypertrophy.   Systolic function was normal. The estimated ejection fraction was   in the range of 55% to 60%. Features are consistent with a   pseudonormal left ventricular filling pattern, with concomitant   abnormal relaxation and increased filling pressure (grade 2   diastolic dysfunction). Doppler parameters are consistent with   high ventricular filling pressure. - Left atrium: The atrium was mildly dilated. - Right ventricle: The cavity size was normal. Wall thickness was   normal. Systolic function was normal. - Tricuspid valve: There was trivial regurgitation. - Pulmonary arteries: Systolic pressure was within the normal   range. PA peak pressure: 27 mm Hg (S). - Pericardium, extracardiac: A trivial pericardial effusion was   identified. - Global longitudinal strain -18.3%.  Myoview 02/26/17: Study Highlights    Inferior (basal) and inferolateral (base, mid) thinning with mild improvement in recovery Does not appear to be significant for ischemia probably reflects soft tissue attenuation (attenuation)  Nuclear stress EF: 61%.  Low risk scan     ASSESSMENT AND PLAN:  1.  CAD with CTO of the LCx supplying one large OM branch. There are left to left collaterals. Patient is symptomatic and has evidence of ischemia on nuclear stress testing. Unsuccessful PCI previously due to inability to cross with wire. I think he is a suitable candidate for CTO PCI of the LCx. We will plan on proceeding on September 26.  Continue ASA and Metoprolol. On Plavix 75 mg daily.   2. HTN improved control with increase lisinopril   3. HLD intolerant of statins. On Zetia. LDL 89.     Current medicines are reviewed at length with the patient today.  The patient does not have concerns regarding medicines.  The following changes have been made:  See above   Signed, Erisa Mehlman [place], MD  04/24/2017 3:28 PM    [facility] [address], [place], [place],  [address] Phone [phone], Fax [phone]

## 2017-04-24 ENCOUNTER — Other Ambulatory Visit: Payer: Self-pay | Admitting: Cardiology

## 2017-04-24 ENCOUNTER — Ambulatory Visit (INDEPENDENT_AMBULATORY_CARE_PROVIDER_SITE_OTHER): Payer: Medicare Other | Admitting: Cardiology

## 2017-04-24 ENCOUNTER — Encounter: Payer: Self-pay | Admitting: Cardiology

## 2017-04-24 VITALS — BP 146/83 | HR 62 | Ht 71.0 in | Wt 227.4 lb

## 2017-04-24 DIAGNOSIS — I10 Essential (primary) hypertension: Secondary | ICD-10-CM | POA: Diagnosis not present

## 2017-04-24 DIAGNOSIS — I208 Other forms of angina pectoris: Secondary | ICD-10-CM

## 2017-04-24 DIAGNOSIS — I200 Unstable angina: Secondary | ICD-10-CM | POA: Diagnosis not present

## 2017-04-24 DIAGNOSIS — I25110 Atherosclerotic heart disease of native coronary artery with unstable angina pectoris: Secondary | ICD-10-CM

## 2017-04-24 DIAGNOSIS — E785 Hyperlipidemia, unspecified: Secondary | ICD-10-CM

## 2017-04-24 DIAGNOSIS — I209 Angina pectoris, unspecified: Secondary | ICD-10-CM

## 2017-04-24 NOTE — Addendum Note (Signed)
Addended by: Kathyrn Lass on: 04/24/2017 03:56 PM   Modules accepted: Orders

## 2017-04-24 NOTE — Patient Instructions (Signed)
   [facility] [address] [place] [address] Dept: [phone] Loc: [phone]  Dak Szumski  04/24/2017  You are scheduled for a CTO cath  on Wednesday 05/14/17 with Dr. [place].  1. Please arrive at the [facility] (Main Entrance A) at [facility]: [address] [place], [place] [address] at 6:30 am (two hours before your procedure to ensure your preparation). Free valet parking service is available.   Special note: Every effort is made to have your procedure done on time. Please understand that emergencies sometimes delay scheduled procedures.  2. Diet: NOTHING TO EAT OR DRINK AFTER MIDNIGHT  3. Labs: ( bmet,cbc,pt )  [facility] Tuesday 05/06/17 you may eat  No appointment needed  8:00 am to 12:00 noon  4. Medication instructions in preparation for your procedure:      TAKE ASPIRIN and PLAVIX MORNING OF CATH.   On the morning of your procedure, take morning medicines NOT listed above.  You may use sips of water.  5. Plan for one night stay--bring personal belongings.  6. Bring a current list of your medications and current insurance cards.  7. You MUST have a responsible person to drive you home.  8. Someone MUST be with you the first 24 hours after you arrive home or your discharge will be delayed.  9. Please wear clothes that are easy to get on and off and wear slip-on shoes.  Thank you for allowing [place] to care for you!   -- [facility]

## 2017-05-06 DIAGNOSIS — E785 Hyperlipidemia, unspecified: Secondary | ICD-10-CM | POA: Diagnosis not present

## 2017-05-06 DIAGNOSIS — I10 Essential (primary) hypertension: Secondary | ICD-10-CM | POA: Diagnosis not present

## 2017-05-06 DIAGNOSIS — I208 Other forms of angina pectoris: Secondary | ICD-10-CM | POA: Diagnosis not present

## 2017-05-06 DIAGNOSIS — I25110 Atherosclerotic heart disease of native coronary artery with unstable angina pectoris: Secondary | ICD-10-CM | POA: Diagnosis not present

## 2017-05-06 LAB — CBC WITH DIFFERENTIAL/PLATELET
BASOS: 0 %
Basophils Absolute: 0 10*3/uL (ref 0.0–0.2)
EOS (ABSOLUTE): 0.2 10*3/uL (ref 0.0–0.4)
EOS: 2 %
Hematocrit: 43.2 % (ref 37.5–51.0)
Hemoglobin: 15.1 g/dL (ref 13.0–17.7)
IMMATURE GRANS (ABS): 0 10*3/uL (ref 0.0–0.1)
IMMATURE GRANULOCYTES: 0 %
LYMPHS: 20 %
Lymphocytes Absolute: 1.6 10*3/uL (ref 0.7–3.1)
MCH: 31.7 pg (ref 26.6–33.0)
MCHC: 35 g/dL (ref 31.5–35.7)
MCV: 91 fL (ref 79–97)
Monocytes Absolute: 0.7 10*3/uL (ref 0.1–0.9)
Monocytes: 9 %
Neutrophils Absolute: 5.5 10*3/uL (ref 1.4–7.0)
Neutrophils: 69 %
PLATELETS: 198 10*3/uL (ref 150–379)
RBC: 4.76 x10E6/uL (ref 4.14–5.80)
RDW: 12.7 % (ref 12.3–15.4)
WBC: 8 10*3/uL (ref 3.4–10.8)

## 2017-05-06 LAB — BASIC METABOLIC PANEL
BUN/Creatinine Ratio: 7 — ABNORMAL LOW (ref 10–24)
BUN: 7 mg/dL — ABNORMAL LOW (ref 8–27)
CALCIUM: 9.1 mg/dL (ref 8.6–10.2)
CO2: 24 mmol/L (ref 20–29)
CREATININE: 1.07 mg/dL (ref 0.76–1.27)
Chloride: 91 mmol/L — ABNORMAL LOW (ref 96–106)
GFR calc Af Amer: 80 mL/min/{1.73_m2} (ref >59)
GFR, EST NON AFRICAN AMERICAN: 69 mL/min/{1.73_m2} (ref >59)
Glucose: 88 mg/dL (ref 65–99)
Potassium: 5 mmol/L (ref 3.5–5.2)
Sodium: 127 mmol/L — ABNORMAL LOW (ref 134–144)

## 2017-05-06 LAB — PROTIME-INR
INR: 1 (ref 0.8–1.2)
Prothrombin Time: 10.7 s (ref 9.1–12.0)

## 2017-05-14 ENCOUNTER — Inpatient Hospital Stay (HOSPITAL_COMMUNITY)
Admission: RE | Admit: 2017-05-14 | Discharge: 2017-05-18 | DRG: 271 | Disposition: A | Discharge status: Home / Self Care | Payer: Medicare Other | Source: Ambulatory Visit | Attending: Cardiology | Admitting: Cardiology

## 2017-05-14 ENCOUNTER — Encounter (HOSPITAL_COMMUNITY): Payer: Self-pay | Admitting: Cardiology

## 2017-05-14 ENCOUNTER — Inpatient Hospital Stay (HOSPITAL_COMMUNITY)
Admission: RE | Disposition: A | Discharge status: Home / Self Care | Payer: Self-pay | Source: Ambulatory Visit | Attending: Cardiology

## 2017-05-14 ENCOUNTER — Ambulatory Visit (HOSPITAL_BASED_OUTPATIENT_CLINIC_OR_DEPARTMENT_OTHER): Payer: Medicare Other

## 2017-05-14 ENCOUNTER — Encounter (HOSPITAL_COMMUNITY)
Admission: RE | Disposition: A | Discharge status: Home / Self Care | Payer: Self-pay | Source: Ambulatory Visit | Attending: Cardiology

## 2017-05-14 DIAGNOSIS — I313 Pericardial effusion (noninflammatory): Secondary | ICD-10-CM

## 2017-05-14 DIAGNOSIS — Z9861 Coronary angioplasty status: Secondary | ICD-10-CM | POA: Diagnosis not present

## 2017-05-14 DIAGNOSIS — Z7982 Long term (current) use of aspirin: Secondary | ICD-10-CM

## 2017-05-14 DIAGNOSIS — Z9842 Cataract extraction status, left eye: Secondary | ICD-10-CM | POA: Diagnosis not present

## 2017-05-14 DIAGNOSIS — I209 Angina pectoris, unspecified: Secondary | ICD-10-CM | POA: Diagnosis not present

## 2017-05-14 DIAGNOSIS — I312 Hemopericardium, not elsewhere classified: Secondary | ICD-10-CM | POA: Diagnosis not present

## 2017-05-14 DIAGNOSIS — I959 Hypotension, unspecified: Secondary | ICD-10-CM | POA: Diagnosis not present

## 2017-05-14 DIAGNOSIS — I314 Cardiac tamponade: Secondary | ICD-10-CM | POA: Diagnosis present

## 2017-05-14 DIAGNOSIS — K219 Gastro-esophageal reflux disease without esophagitis: Secondary | ICD-10-CM | POA: Diagnosis present

## 2017-05-14 DIAGNOSIS — J9811 Atelectasis: Secondary | ICD-10-CM | POA: Diagnosis not present

## 2017-05-14 DIAGNOSIS — Z87891 Personal history of nicotine dependence: Secondary | ICD-10-CM

## 2017-05-14 DIAGNOSIS — Z961 Presence of intraocular lens: Secondary | ICD-10-CM | POA: Diagnosis present

## 2017-05-14 DIAGNOSIS — I9751 Accidental puncture and laceration of a circulatory system organ or structure during a circulatory system procedure: Secondary | ICD-10-CM | POA: Diagnosis not present

## 2017-05-14 DIAGNOSIS — I10 Essential (primary) hypertension: Secondary | ICD-10-CM | POA: Diagnosis present

## 2017-05-14 DIAGNOSIS — E782 Mixed hyperlipidemia: Secondary | ICD-10-CM | POA: Diagnosis present

## 2017-05-14 DIAGNOSIS — I252 Old myocardial infarction: Secondary | ICD-10-CM | POA: Diagnosis not present

## 2017-05-14 DIAGNOSIS — Z8249 Family history of ischemic heart disease and other diseases of the circulatory system: Secondary | ICD-10-CM | POA: Diagnosis not present

## 2017-05-14 DIAGNOSIS — Z23 Encounter for immunization: Secondary | ICD-10-CM | POA: Diagnosis not present

## 2017-05-14 DIAGNOSIS — I2582 Chronic total occlusion of coronary artery: Secondary | ICD-10-CM | POA: Diagnosis present

## 2017-05-14 DIAGNOSIS — E785 Hyperlipidemia, unspecified: Secondary | ICD-10-CM | POA: Diagnosis not present

## 2017-05-14 DIAGNOSIS — I3139 Other pericardial effusion (noninflammatory): Secondary | ICD-10-CM | POA: Diagnosis present

## 2017-05-14 DIAGNOSIS — Z7902 Long term (current) use of antithrombotics/antiplatelets: Secondary | ICD-10-CM

## 2017-05-14 DIAGNOSIS — I25119 Atherosclerotic heart disease of native coronary artery with unspecified angina pectoris: Principal | ICD-10-CM | POA: Diagnosis present

## 2017-05-14 DIAGNOSIS — Z888 Allergy status to other drugs, medicaments and biological substances status: Secondary | ICD-10-CM | POA: Diagnosis not present

## 2017-05-14 DIAGNOSIS — I2510 Atherosclerotic heart disease of native coronary artery without angina pectoris: Secondary | ICD-10-CM | POA: Diagnosis present

## 2017-05-14 DIAGNOSIS — I119 Hypertensive heart disease without heart failure: Secondary | ICD-10-CM | POA: Diagnosis present

## 2017-05-14 DIAGNOSIS — Z9841 Cataract extraction status, right eye: Secondary | ICD-10-CM

## 2017-05-14 DIAGNOSIS — I25110 Atherosclerotic heart disease of native coronary artery with unstable angina pectoris: Secondary | ICD-10-CM | POA: Diagnosis not present

## 2017-05-14 HISTORY — PX: PERICARDIOCENTESIS: CATH118255

## 2017-05-14 HISTORY — DX: Non-ST elevation (NSTEMI) myocardial infarction: I21.4

## 2017-05-14 HISTORY — DX: Personal history of other diseases of the musculoskeletal system and connective tissue: Z87.39

## 2017-05-14 HISTORY — PX: ULTRASOUND GUIDANCE FOR VASCULAR ACCESS: SHX6516

## 2017-05-14 HISTORY — PX: CORONARY CTO INTERVENTION: CATH118236

## 2017-05-14 HISTORY — DX: Cardiac tamponade: I31.4

## 2017-05-14 HISTORY — DX: Pure hypercholesterolemia, unspecified: E78.00

## 2017-05-14 LAB — BODY FLUID CELL COUNT WITH DIFFERENTIAL
Eos, Fluid: 0 %
LYMPHS FL: 23 %
Monocyte-Macrophage-Serous Fluid: 10 % — ABNORMAL LOW (ref 50–90)
NEUTROPHIL FLUID: 67 % — AB (ref 0–25)
Other Cells, Fluid: 0 %
Total Nucleated Cell Count, Fluid: 5650 cu mm — ABNORMAL HIGH (ref 0–1000)

## 2017-05-14 LAB — POCT ACTIVATED CLOTTING TIME
ACTIVATED CLOTTING TIME: 312 s
ACTIVATED CLOTTING TIME: 329 s
ACTIVATED CLOTTING TIME: 274 s
Activated Clotting Time: 268 seconds
Activated Clotting Time: 323 seconds
Activated Clotting Time: 307 seconds
Activated Clotting Time: 208 seconds
Activated Clotting Time: 164 seconds

## 2017-05-14 LAB — BASIC METABOLIC PANEL
ANION GAP: 9 (ref 5–15)
BUN: 6 mg/dL (ref 6–20)
CHLORIDE: 101 mmol/L (ref 101–111)
CO2: 21 mmol/L — AB (ref 22–32)
Calcium: 8.3 mg/dL — ABNORMAL LOW (ref 8.9–10.3)
Creatinine, Ser: 0.98 mg/dL (ref 0.61–1.24)
GFR calc non Af Amer: >60 mL/min (ref >60)
Glucose, Bld: 139 mg/dL — ABNORMAL HIGH (ref 65–99)
POTASSIUM: 4 mmol/L (ref 3.5–5.1)
Sodium: 131 mmol/L — ABNORMAL LOW (ref 135–145)

## 2017-05-14 LAB — CBC
HCT: 41.7 % (ref 39.0–52.0)
HEMOGLOBIN: 14.4 g/dL (ref 13.0–17.0)
MCH: 30.7 pg (ref 26.0–34.0)
MCHC: 34.5 g/dL (ref 30.0–36.0)
MCV: 88.9 fL (ref 78.0–100.0)
Platelets: 208 10*3/uL (ref 150–400)
RBC: 4.69 MIL/uL (ref 4.22–5.81)
RDW: 12.8 % (ref 11.5–15.5)
WBC: 18.6 10*3/uL — AB (ref 4.0–10.5)

## 2017-05-14 LAB — ECHOCARDIOGRAM COMPLETE
HEIGHTINCHES: 71 in
Weight: 3600 oz

## 2017-05-14 SURGERY — CORONARY CTO INTERVENTION
Anesthesia: LOCAL

## 2017-05-14 SURGERY — PERICARDIOCENTESIS
Anesthesia: LOCAL

## 2017-05-14 MED ORDER — ASPIRIN EC 81 MG PO TBEC: 81.0000 mg | DELAYED_RELEASE_TABLET | Freq: Every day | ORAL | Status: DC

## 2017-05-14 MED ORDER — LIDOCAINE HCL (PF) 1 % IJ SOLN
INTRAMUSCULAR | Status: DC | PRN
  Administered 2017-05-14: 15 mL

## 2017-05-14 MED ORDER — HEPARIN (PORCINE) IN NACL 2-0.9 UNIT/ML-% IJ SOLN
INTRAMUSCULAR | Status: AC
  Filled 2017-05-14: qty 500

## 2017-05-14 MED ORDER — IOPAMIDOL (ISOVUE-370) INJECTION 76%
INTRAVENOUS | Status: DC | PRN
  Administered 2017-05-14: 165 mL via INTRA_ARTERIAL

## 2017-05-14 MED ORDER — FENTANYL CITRATE (PF) 100 MCG/2ML IJ SOLN
INTRAMUSCULAR | Status: AC
  Filled 2017-05-14: qty 2

## 2017-05-14 MED ORDER — DOPAMINE-DEXTROSE 3.2-5 MG/ML-% IV SOLN
INTRAVENOUS | Status: AC
  Administered 2017-05-14: 5 ug/kg/min via INTRAMUSCULAR
  Filled 2017-05-14: qty 250

## 2017-05-14 MED ORDER — FENTANYL CITRATE (PF) 100 MCG/2ML IJ SOLN
INTRAMUSCULAR | Status: DC | PRN
  Administered 2017-05-14 (×3): 25 ug via INTRAVENOUS

## 2017-05-14 MED ORDER — MORPHINE SULFATE (PF) 4 MG/ML IV SOLN: 2.0000 mg | Freq: Once | INTRAVENOUS | Status: AC

## 2017-05-14 MED ORDER — MIDAZOLAM HCL 2 MG/2ML IJ SOLN
INTRAMUSCULAR | Status: DC | PRN
  Administered 2017-05-14 (×2): 1 mg via INTRAVENOUS

## 2017-05-14 MED ORDER — MIDAZOLAM HCL 2 MG/2ML IJ SOLN
INTRAMUSCULAR | Status: DC | PRN
  Administered 2017-05-14: 1 mg via INTRAVENOUS
  Administered 2017-05-14: 2 mg via INTRAVENOUS
  Administered 2017-05-14: 1 mg via INTRAVENOUS

## 2017-05-14 MED ORDER — ONDANSETRON HCL 4 MG/2ML IJ SOLN
4.0000 mg | Freq: Four times a day (QID) | INTRAMUSCULAR | Status: DC | PRN
  Administered 2017-05-14: 4 mg via INTRAVENOUS
  Filled 2017-05-14: qty 2

## 2017-05-14 MED ORDER — ACETAMINOPHEN 325 MG PO TABS: 650.0000 mg | ORAL_TABLET | ORAL | Status: DC | PRN

## 2017-05-14 MED ORDER — HEPARIN (PORCINE) IN NACL 2-0.9 UNIT/ML-% IJ SOLN
INTRAMUSCULAR | Status: AC | PRN
  Administered 2017-05-14: 500 mL

## 2017-05-14 MED ORDER — EZETIMIBE 10 MG PO TABS
10.0000 mg | ORAL_TABLET | Freq: Every day | ORAL | Status: DC
  Administered 2017-05-15 – 2017-05-18 (×4): 10 mg via ORAL
  Filled 2017-05-14 (×4): qty 1

## 2017-05-14 MED ORDER — INFLUENZA VAC SPLIT HIGH-DOSE 0.5 ML IM SUSY
0.5000 mL | PREFILLED_SYRINGE | INTRAMUSCULAR | Status: AC
Start: 2017-05-15 — End: 2017-05-15
  Administered 2017-05-15: 0.5 mL via INTRAMUSCULAR
  Filled 2017-05-14: qty 0.5

## 2017-05-14 MED ORDER — FENTANYL CITRATE (PF) 100 MCG/2ML IJ SOLN
INTRAMUSCULAR | Status: DC | PRN
  Administered 2017-05-14 (×4): 25 ug via INTRAVENOUS

## 2017-05-14 MED ORDER — LIDOCAINE HCL 2 % IJ SOLN
INTRAMUSCULAR | Status: AC
  Filled 2017-05-14: qty 10

## 2017-05-14 MED ORDER — PROSIGHT PO TABS
1.0000 | ORAL_TABLET | Freq: Two times a day (BID) | ORAL | Status: DC
Start: 2017-05-14 — End: 2017-05-18
  Administered 2017-05-14 – 2017-05-18 (×8): 1 via ORAL
  Filled 2017-05-14 (×8): qty 1

## 2017-05-14 MED ORDER — CLOPIDOGREL BISULFATE 75 MG PO TABS: 75.0000 mg | ORAL_TABLET | Freq: Every day | ORAL | Status: DC

## 2017-05-14 MED ORDER — MIDAZOLAM HCL 2 MG/2ML IJ SOLN
INTRAMUSCULAR | Status: AC
  Filled 2017-05-14: qty 2

## 2017-05-14 MED ORDER — ATROPINE SULFATE 1 MG/10ML IJ SOSY
PREFILLED_SYRINGE | INTRAMUSCULAR | Status: AC
  Filled 2017-05-14: qty 10

## 2017-05-14 MED ORDER — PANTOPRAZOLE SODIUM 40 MG PO TBEC
40.0000 mg | DELAYED_RELEASE_TABLET | Freq: Two times a day (BID) | ORAL | Status: DC
  Administered 2017-05-14 – 2017-05-18 (×8): 40 mg via ORAL
  Filled 2017-05-14 (×8): qty 1

## 2017-05-14 MED ORDER — IOPAMIDOL (ISOVUE-370) INJECTION 76%
INTRAVENOUS | Status: AC
  Filled 2017-05-14: qty 125

## 2017-05-14 MED ORDER — SODIUM CHLORIDE 0.9 % WEIGHT BASED INFUSION
1.0000 mL/kg/h | INTRAVENOUS | Status: AC
  Administered 2017-05-14: 1 mL/kg/h via INTRAVENOUS
  Administered 2017-05-14: 9.785 mL/kg/h via INTRAVENOUS

## 2017-05-14 MED ORDER — ASPIRIN 81 MG PO CHEW: 81.0000 mg | CHEWABLE_TABLET | ORAL | Status: DC

## 2017-05-14 MED ORDER — MORPHINE SULFATE (PF) 4 MG/ML IV SOLN
INTRAVENOUS | Status: AC
  Administered 2017-05-14: 2 mg via INTRAMUSCULAR
  Filled 2017-05-14: qty 1

## 2017-05-14 MED ORDER — METOPROLOL TARTRATE 25 MG PO TABS: 25.0000 mg | ORAL_TABLET | Freq: Two times a day (BID) | ORAL | Status: DC

## 2017-05-14 MED ORDER — DEXTROSE 5 % IV SOLN
0.0000 ug/min | INTRAVENOUS | Status: DC
  Filled 2017-05-14: qty 4

## 2017-05-14 MED ORDER — SODIUM CHLORIDE 0.9 % IV SOLN: 250.0000 mL | INTRAVENOUS | Status: DC | PRN

## 2017-05-14 MED ORDER — SODIUM CHLORIDE 0.9 % IV SOLN
INTRAVENOUS | Status: DC
  Administered 2017-05-14: 07:00:00 via INTRAVENOUS

## 2017-05-14 MED ORDER — SODIUM CHLORIDE 0.9% FLUSH
3.0000 mL | Freq: Two times a day (BID) | INTRAVENOUS | Status: DC
  Administered 2017-05-14 – 2017-05-18 (×7): 3 mL via INTRAVENOUS

## 2017-05-14 MED ORDER — HEPARIN (PORCINE) IN NACL 2-0.9 UNIT/ML-% IJ SOLN
INTRAMUSCULAR | Status: AC | PRN
  Administered 2017-05-14: 2000 mL

## 2017-05-14 MED ORDER — SODIUM CHLORIDE 0.9% FLUSH: 3.0000 mL | INTRAVENOUS | Status: DC | PRN

## 2017-05-14 MED ORDER — NITROGLYCERIN 0.4 MG SL SUBL: 0.4000 mg | SUBLINGUAL_TABLET | SUBLINGUAL | Status: DC | PRN

## 2017-05-14 MED ORDER — NITROGLYCERIN 1 MG/10 ML FOR IR/CATH LAB
INTRA_ARTERIAL | Status: AC
  Filled 2017-05-14: qty 10

## 2017-05-14 MED ORDER — HEPARIN SODIUM (PORCINE) 1000 UNIT/ML IJ SOLN
INTRAMUSCULAR | Status: DC | PRN
  Administered 2017-05-14: 9000 [IU] via INTRAVENOUS
  Administered 2017-05-14: 3000 [IU] via INTRAVENOUS
  Administered 2017-05-14: 2000 [IU] via INTRAVENOUS
  Administered 2017-05-14: 3000 [IU] via INTRAVENOUS

## 2017-05-14 MED ORDER — IOPAMIDOL (ISOVUE-370) INJECTION 76%
INTRAVENOUS | Status: AC
  Filled 2017-05-14: qty 100

## 2017-05-14 MED ORDER — FENTANYL CITRATE (PF) 100 MCG/2ML IJ SOLN
INTRAMUSCULAR | Status: AC
Start: 2017-05-14 — End: ?
  Filled 2017-05-14: qty 2

## 2017-05-14 MED ORDER — PRESERVISION AREDS PO CAPS: ORAL_CAPSULE | Freq: Two times a day (BID) | ORAL | Status: DC

## 2017-05-14 MED ORDER — DOPAMINE-DEXTROSE 3.2-5 MG/ML-% IV SOLN: 0.0000 ug/kg/min | INTRAVENOUS | Status: DC

## 2017-05-14 MED ORDER — LISINOPRIL 20 MG PO TABS: 20.0000 mg | ORAL_TABLET | Freq: Every day | ORAL | Status: DC

## 2017-05-14 MED ORDER — HEPARIN SODIUM (PORCINE) 1000 UNIT/ML IJ SOLN
INTRAMUSCULAR | Status: AC
  Filled 2017-05-14: qty 1

## 2017-05-14 MED ORDER — SODIUM CHLORIDE 0.9% FLUSH: 3.0000 mL | Freq: Two times a day (BID) | INTRAVENOUS | Status: DC

## 2017-05-14 MED ORDER — HEPARIN (PORCINE) IN NACL 2-0.9 UNIT/ML-% IJ SOLN
INTRAMUSCULAR | Status: AC
  Filled 2017-05-14: qty 1000

## 2017-05-14 MED ORDER — FENTANYL CITRATE (PF) 100 MCG/2ML IJ SOLN
50.0000 ug | INTRAMUSCULAR | Status: DC | PRN
  Administered 2017-05-14 – 2017-05-15 (×2): 50 ug via INTRAVENOUS
  Filled 2017-05-14 (×2): qty 2

## 2017-05-14 MED ORDER — LIDOCAINE HCL (PF) 1 % IJ SOLN
INTRAMUSCULAR | Status: DC | PRN
  Administered 2017-05-14: 15 mL
  Administered 2017-05-14: 12 mL

## 2017-05-14 SURGICAL SUPPLY — 27 items
BALLN EMERGE MR 2.0X12 (BALLOONS) ×3
BALLN EMERGE MR 2.5X12 (BALLOONS) ×3
BALLOON EMERGE MR 2.0X12 (BALLOONS) ×2 IMPLANT
BALLOON EMERGE MR 2.5X12 (BALLOONS) ×2 IMPLANT
CATH CROSSBOSS CTO (CATHETERS) ×3 IMPLANT
CATH INFINITI JR4 5F (CATHETERS) ×3 IMPLANT
CATH MACH1 8FR CLS3.5 (CATHETERS) ×3 IMPLANT
CATH STINGRAY CTO 135CM (CATHETERS) ×6 IMPLANT
CATH TURNPIKE 135CM (CATHETERS) ×3 IMPLANT
COVER PRB 48X5XTLSCP FOLD TPE (BAG) ×2 IMPLANT
COVER PROBE 5X48 (BAG) ×1
KIT ENCORE 26 ADVANTAGE (KITS) ×3 IMPLANT
KIT HEART LEFT (KITS) ×6 IMPLANT
PACK CARDIAC CATHETERIZATION (CUSTOM PROCEDURE TRAY) ×3 IMPLANT
SHEATH BRITE TIP 8FR 35CM (SHEATH) ×3 IMPLANT
SHEATH PINNACLE 5F 10CM (SHEATH) ×3 IMPLANT
STOPCOCK MORSE 400PSI 3WAY (MISCELLANEOUS) ×3 IMPLANT
TRANSDUCER W/STOPCOCK (MISCELLANEOUS) ×6 IMPLANT
TUBING CIL FLEX 10 FLL-RA (TUBING) ×6 IMPLANT
VALVE GUARDIAN II ~~LOC~~ HEMO (MISCELLANEOUS) ×3 IMPLANT
WIRE ASAHI MIRACLEBROS-3 180CM (WIRE) ×3 IMPLANT
WIRE ASAHI MIRACLEBROS-6 300CM (WIRE) ×3 IMPLANT
WIRE ASAHI PROWATER 180CM (WIRE) ×3 IMPLANT
WIRE EMERALD 3MM-J .035X150CM (WIRE) ×3 IMPLANT
WIRE FIGHTER CROSSING 190CM (WIRE) ×3 IMPLANT
WIRE HI TORQ PILOT-200 300CM (WIRE) ×6 IMPLANT
WIRE STINGRAY 300CM (WIRE) ×6 IMPLANT

## 2017-05-14 SURGICAL SUPPLY — 5 items
EVACUATOR 1/8 PVC DRAIN (DRAIN) ×2 IMPLANT
PACK CARDIAC CATHETERIZATION (CUSTOM PROCEDURE TRAY) ×2 IMPLANT
PERIVAC PERICARDIOCENTESIS 8.3 (TRAY / TRAY PROCEDURE) ×2 IMPLANT
PROTECTION STATION PRESSURIZED (MISCELLANEOUS) ×2
STATION PROTECTION PRESSURIZED (MISCELLANEOUS) ×1 IMPLANT

## 2017-05-14 NOTE — Progress Notes (Signed)
Patient from cath lab with right groin bleeding. Manual pressure held by Cath Lab for a while,  then fem stop placed on patient. Education reinforced. Patient is very tense in his pelvic muscles and is having trouble relaxing. He is complaining of pain at the right groin site. Provider on Call paged and notified. Orders obtained and will be implemented.

## 2017-05-14 NOTE — Progress Notes (Signed)
Patient's cell phone given to patient's son at this time.

## 2017-05-14 NOTE — Progress Notes (Signed)
Cardiology Critical Care  71 year old gentleman who several hours after attempted CTO of the circumflex developed shock, nausea, diaphoresis, at the time of left femoral sheath removal. He was treated with IV fluids, IV atropine, and dopamine. This led to significant tachycardia and increasing chest discomfort. Blood pressure improved but the patient continued to complain of severe chest discomfort. Dopamine was discontinued and blood pressure quickly dropped below 70 mmHg with persistent tachycardia. Denies back discomfort. Levophed was started.  Neck veins were distended, no rub was heard. Breath sounds were audible. EKG did not demonstrate acute injury. Femoral region and not reveal any significant hematoma.  Emergency echocardiogram demonstrated a circumferential pericardial effusion with evidence of thrombus in the apical region. As imaging was being performed, the fusion appeared to be slightly increasing in size. No wall motion ultrasound mouth he was noted.  Impression: Acute pericardial tamponade secondary to procedure related to coronary perforation.  Plan: Continue aggressive IV fluid resuscitation, IV pressor, Cath Lab activated for emergency pericardiocentesis. Surgical service on standby in case needed.  Critical care time 50 minutes.

## 2017-05-14 NOTE — Progress Notes (Signed)
  There are additional images taken during and post percardiocentesis. Pre, during, and post are all on the same echo. The echo was read prior to percardiocentesis   Elmer Ramp 05/14/2017, 5:36 PM

## 2017-05-14 NOTE — Progress Notes (Signed)
Called for assistance.  On arrival to patients room, RN at bedside, patient in trendelenburg and RN holding pressure on left groin sheath.  AS per RN patient became hypotensive with SBP in the 60's HR dropped to 50's and c/o CP 3/10.  Patient denies dizziness or SOB and endorses CP as now 2/10.  RN started a NS bolus prior to my arrival, Recommended EKG, however not done due to cath team to remove sheath and wanted to pull sheath.  SBP at that time was 93. Recommend calling MD while cath team pulling sheath.  NO RRT interventions at this time.  RN to call if assistance needed

## 2017-05-14 NOTE — Progress Notes (Signed)
  Echocardiogram 2D Echocardiogram has been performed.  Merrie Roof F 05/14/2017, 5:35 PM

## 2017-05-14 NOTE — Progress Notes (Signed)
Site area: Left groin Site prior: Hematoma, softball sized. Ecchymosis visible lateral to site Manual Pressure applied for: 45 min Patient Status During pull: Chest pain prior to pull, EKG negative at time. Vagal response elicited prior to pull, NS bolus given and placed in trendelenburg positioning. Once stable, sheath was removed. BP dropped to 60/40, HR: 60. NP notified and brought to bedside. Dopamine started at that time. Tamala Julian MD arrived-see his note. Patient stabilized and bleed stopped after 45 minutes of holding pressure.  Post Pull site: Site appears visibly swollen but soft, ecchymosis still present laterally.  Post pull instructions given during second sheath pull in cath lab at later time when patient could verbalize understanding. Post pull pulses present: Yes, faint Dressing applied Bedrest begins at: Following second and final sheath pull time. Comments: Decline in patient status discovered to be due to a disease process unrelated to sheath pull.

## 2017-05-14 NOTE — Progress Notes (Signed)
Off floor to cath lab at this time. Report given to Doretha Sou RN on 2H. Patient's son and brother updated.

## 2017-05-14 NOTE — Interval H&P Note (Signed)
History and Physical Interval Note:  05/14/2017 8:19 AM  Joseph Mercado  has presented today for surgery, with the diagnosis of CAD  The various methods of treatment have been discussed with the patient and family. After consideration of risks, benefits and other options for treatment, the patient has consented to  Procedure(s): CORONARY CTO INTERVENTION (N/A) as a surgical intervention .  The patient's history has been reviewed, patient examined, no change in status, stable for surgery.  I have reviewed the patient's chart and labs.  Questions were answered to the patient's satisfaction.   Cath Lab Visit (complete for each Cath Lab visit)  Clinical Evaluation Leading to the Procedure:   ACS: No.  Non-ACS:    Anginal Classification: CCS II  Anti-ischemic medical therapy: Minimal Therapy (1 class of medications)  Non-Invasive Test Results: Intermediate-risk stress test findings: cardiac mortality 1-3%/year  Prior CABG: No previous CABG        [name] 05/14/2017  8:19 AM

## 2017-05-14 NOTE — H&P (View-Only) (Signed)
Cardiology Office Note   Date:  04/24/2017   ID:  Joseph Mercado, DOB February 28, 1946, MRN [id number]  PCP:  Abigail Miyamoto, MD  Cardiologist:  Nanetta Batty MD  Chief Complaint  Patient presents with  . Coronary Artery Disease      History of Present Illness: Joseph Mercado is a 71 y.o. male who is seen at the request of Dr. Allyson Sabal for consideration of CTO PCI. He has a history of HTN and family history of CAD. Apparently had a stress test 2 years ago that was OK. Seen in June by Dr. Allyson Sabal with a several month history of progressive DOE and chest pain on exertion. Echo showed mild LVH with grade 2 diastolic dysfunction. Valves were OK. Myoview study showed inferolateral ischemia. This led to a cardiac cath on 03/10/17 which showed CTO of the LCx with collaterals to a fairly large OM. PCI was attempted but unable to cross completely into the large OM so unable to recanalize the vessel. It was noted that this was a long and difficult procedure with radiation dose of 4 gray.   On follow up today he states he is doing OK. He still has some chest pain from time to time. No skin toxicity from prior cath. No bleeding. History of intolerance to statins.    Past Medical History:  Diagnosis Date  . CAD (coronary artery disease)    a. 02/2017: NSTEMI with cath showing 100% Prox Cx stenosis with 95% Mid Cx stenosis and 45% mid-LAD stenosis. Unsuccessful attempt at crossing an occluded mid nondominant circumflex --> plan for staged PCI in coming weeks  . Chest pain 02/2017  . Dyspnea   . GERD (gastroesophageal reflux disease)   . Hypertension   . Macular degeneration     Past Surgical History:  Procedure Laterality Date  . CATARACT EXTRACTION, BILATERAL    . CORONARY BALLOON ANGIOPLASTY N/A 03/10/2017   Procedure: Coronary Balloon Angioplasty;  Surgeon: Runell Gess, MD;  Location: [facility] IN[facility]ASIVE CV LAB;  Service: Cardiovascular;  Laterality: N/A;  CFX  . LEFT HEART CATH AND CORONARY  ANGIOGRAPHY N/A 03/10/2017   Procedure: Left Heart Cath and Coronary Angiography;  Surgeon: Runell Gess, MD;  [facility] IN[facility]AS[facility] CV LAB;  Service: Cardiovascular;  Laterality: N/A;     Current Outpatient Prescriptions  Medication Sig Dispense Refill  . aspirin EC 81 MG EC tablet Take 1 tablet (81 mg total) by mouth daily.    . clopidogrel (PLAVIX) 75 MG tablet Take 1 tablet (75 mg total) by mouth daily. 90 tablet 3  . ezetimibe (ZETIA) 10 MG tablet Take 1 tablet (10 mg total) by mouth daily. 30 tablet 6  . lisinopril (PRINIVIL,ZESTRIL) 20 MG tablet Take 1 tablet (20 mg total) by mouth daily. 90 tablet 3  . metoprolol tartrate (LOPRESSOR) 50 MG tablet Take 25 mg by mouth 2 (two) times daily.     . Multiple Vitamins-Minerals (PRESERVISION AREDS PO) Take 1 capsule by mouth 2 (two) times daily.    . nitroGLYCERIN (NITROSTAT) 0.4 MG SL tablet Place 0.4 mg under the tongue every 5 (five) minutes as needed for chest pain.     . pantoprazole (PROTONIX) 40 MG tablet Take 40 mg by mouth 2 (two) times daily.     . sildenafil (REVATIO) 20 MG tablet Take 100 mg by mouth as needed (prior to intercourse).     No current facility-administered medications for this visit.     Allergies:   Patient has no  known allergies.    Social History:  The patient  reports that he has quit smoking. He has never used smokeless tobacco. He reports that he drinks about 1.2 oz of alcohol per week . He reports that he does not use drugs.   Family History:  The patient's family history includes CAD in his brother.    ROS:  Please see the history of present illness.   Otherwise, review of systems are positive for none.   All other systems are reviewed and negative.    PHYSICAL EXAM: VS:  BP (!) 146/83   Pulse 62   Ht 5\' 11"  (1.803 m)   Wt 227 lb 6.4 oz (103.1 kg)   SpO2 97%   BMI 31.72 kg/m  , BMI Body mass index is 31.72 kg/m. GEN: Well nourished, well developed, in no acute distress  HEENT: normal    Neck: no JVD, carotid bruits, or masses Cardiac: RRR; no murmurs, rubs, or gallops,no edema  Respiratory:  clear to auscultation bilaterally, normal work of breathing GI: soft, nontender, nondistended, + BS Ext;  Radial and pedal pulses  2+  Skin: warm and dry, no rash Neuro:  Strength and sensation are intact Psych: euthymic mood, full affect   EKG:  EKG is not ordered today. The ekg ordered today demonstrates N/A   Recent Labs: 03/11/2017: BUN 6; Creatinine, Ser 1.01; Hemoglobin 13.4; Platelets 139; Potassium 4.3; Sodium 132    Lipid Panel    Component Value Date/Time   CHOL 148 03/11/2017 0214   TRIG 72 03/11/2017 0214   HDL 45 03/11/2017 0214   CHOLHDL 3.3 03/11/2017 0214   VLDL 14 03/11/2017 0214   LDLCALC 89 03/11/2017 0214      Wt Readings from Last 3 Encounters:  04/24/17 227 lb 6.4 oz (103.1 kg)  03/13/17 229 lb 3.2 oz (104 kg)  03/11/17 233 lb 11 oz (106 kg)      Other studies Reviewed: Cardiac cath 03/10/17: CARDIAC CATHETERIZATION / PCI LCX    History obtained from chart review.Joseph Mercado is a very pleasant 71 year old moderately overweight divorced Caucasian male father of one child who is accompanied by his friend Joseph Mercado today. He was referred by Dr. Marina Goodell in [facility] for cardiovascular evaluation because of chest pain and shortness of breath. His risk factors include family history a brother who had bypass surgery age 8 and treated hypertension. He quit smoking 35 years ago. He is retired Music therapist. He did have a stress test 2 years ago which apparently was normal. Over the last 2 years she had progressive dyspnea and exertional chest pain as well as nocturnal chest pain. He was admitted in transfer from [facility] with chest pain. His troponins increased to 2. He presents now for cardiac catheterization to define his anatomy.   PROCEDURE DESCRIPTION:   The patient was brought to the second floor [facility] cath lab in the  postabsorptive state. He was  premedicated with Valium 5 mg by mouth, IV Versed and fentanyl. His right wrist was prepped and shaved in usual sterile fashion. Xylocaine 1% was used for local anesthesia. A 6 French sheath was inserted into the right radial  artery using standard Seldinger technique. The patient received 5000 units  of heparin  intravenously.  A 5 [place] TIG catheter and pigtail catheter were used for selective coronary angiography and left ventriculography respectively. Isovue dye was used for the entirety of the case. Retrograde aortic and left ventricular and pullback pressures were recorded. Radial cocktail  was administered via the SideArm sheath.  The patient received hydration and 80 mg Brilenta by mouth followed by Angiomax bolus and infusion with a therapeutic ACT demonstrated. Using a 6 [place] XB 3.5 cm guide catheter along with multiple guidewires including a 014 per water, Fielder XT and a Fighter guidewire I was able to get across the mid AV groove circumflex into the second moderate size marginal branch and angioplasty of the occlusion with a 2 mm x 12 mm balloon. Unfortunately I was unable to redirect the wire and the continuation of the AV groove circumflex in the to the larger third marginal branch. This was collateralized however. The excessive amount of contrast and x-ray dose utilized during the case I aborted the case and was staged intervention tomorrow. Angiomax was turned off. The sheath was removed and a TR band was placed on the wrist to achieve hemostasis. The patient left the lab in stable condition.    IMPRESSION: Unsuccessful attempt at crossing an occluded mid nondominant circumflex into the AV groove continuation of the posterior lateral branch. Patient does have normal LV function. He was pain-free at the end of the case. He was loaded with aspirin and Brilenta. We will restart heparin without a bolus 4 hours after sheath removal. The plan will be to  reattempt circumflex re-intervention tomorrow. The Air Kerma  was 4014 mgr and the fluoroscopytime was 39.6 minutes. Total contrast administered and patient was 190 mL. The ACT was 560.  Nanetta Batty. MD, [facility] 03/10/2017 4:57 PM  Echo 02/26/17: Study Conclusions  - Left ventricle: There was moderate concentric hypertrophy.   Systolic function was normal. The estimated ejection fraction was   in the range of 55% to 60%. Features are consistent with a   pseudonormal left ventricular filling pattern, with concomitant   abnormal relaxation and increased filling pressure (grade 2   diastolic dysfunction). Doppler parameters are consistent with   high ventricular filling pressure. - Left atrium: The atrium was mildly dilated. - Right ventricle: The cavity size was normal. Wall thickness was   normal. Systolic function was normal. - Tricuspid valve: There was trivial regurgitation. - Pulmonary arteries: Systolic pressure was within the normal   range. PA peak pressure: 27 mm Hg (S). - Pericardium, extracardiac: A trivial pericardial effusion was   identified. - Global longitudinal strain -18.3%.  Myoview 02/26/17: Study Highlights    Inferior (basal) and inferolateral (base, mid) thinning with mild improvement in recovery Does not appear to be significant for ischemia probably reflects soft tissue attenuation (attenuation)  Nuclear stress EF: 61%.  Low risk scan     ASSESSMENT AND PLAN:  1.  CAD with CTO of the LCx supplying one large OM branch. There are left to left collaterals. Patient is symptomatic and has evidence of ischemia on nuclear stress testing. Unsuccessful PCI previously due to inability to cross with wire. I think he is a suitable candidate for CTO PCI of the LCx. We will plan on proceeding on September 26.  Continue ASA and Metoprolol. On Plavix 75 mg daily.   2. HTN improved control with increase lisinopril   3. HLD intolerant of statins. On Zetia. LDL 89.     Current medicines are reviewed at length with the patient today.  The patient does not have concerns regarding medicines.  The following changes have been made:  See above   Signed, Erisa Mehlman [place], MD  04/24/2017 3:28 PM    [facility] [address], [place], [place],  [address] Phone [phone], Fax [phone]

## 2017-05-14 NOTE — Care Management Note (Signed)
Case Management Note  Patient Details  Name: Joseph Mercado MRN: [id number] Date of Birth: 1945-11-17  Subjective/Objective:   From home alone, s/p Unsuccessful CTO PCI of the LCx, cont medical management, He was on plavix pta.                  Action/Plan: NCM will follow for dc needs.   Expected Discharge Date:                  Expected Discharge Plan:     In-House Referral:     Discharge planning Services  CM Consult  Post Acute Care Choice:    Choice offered to:     DME Arranged:    DME Agency:     HH Arranged:    HH Agency:     Status of Service:  In process, will continue to follow  If discussed at Long Length of Stay Meetings, dates discussed:    Additional Comments:  Zenon Mayo, RN 05/14/2017, 3:13 PM

## 2017-05-14 NOTE — Progress Notes (Addendum)
At approximately 1430, ACT performed and noted to be 164, cath lab called to remove sheaths. Nellie RN stated "Will send someone up." BP=131/70 at this time. 1445 BP=62/41 repeated BP=56/31 at 1447 HR=57. Patient begins to become unresponsive, rapid response called, patient placed in Trendelenburg position, NS bolus started, and pressure held to left groin site for level 1 hematoma that was noted on admission. 1451 patient is A&O X4 and states he has chest pain rating 2-3/10 at this time, BP=120/76 HR=95. Canal [place], cath nurse team at bedside to pull sheath, patient's BP=93/64 with HR=76. Paoli RN rapid response team at bedside to obtain EKG, cath team to pull sheath, stopped holding pressure at 1505, pressure resumed to inner left groin site by cath team nurse. Dr Martinique paged and informed, states "I will send my PA down." EKG being obtained by Donnalee Curry RN. 1512 sheath removed from left groin by cath team. [place] NP at bedside, Dopamine gtt started and titrated by Saralyn Pilar RN. 1525 Dr Tamala Julian at bedside 1/2 Amp Atropine and 4mg  of Zofran IV given by Antoine Primas. 1532 patient begins to complain of very bad chest pain 10/10 3 mg of morphine IV given. 1533 Dopamine has been stopped and echo ordered. This nurse updated patient's brother and son in waiting area. 1549 1/2 amp Atropine given per Dr Tamala Julian verbal order. 1557 lab work has been ordered, levophed ordered, and family notified that patient will be transferred to [place]. 1558 Echo being performed. 1555 Dopamine has been restarted by RN at bedside at 10 mcg/kg/min, titrated to 5 mcg/kg/min at 1606, titrated to 2 mcg/kg/min at 1608, and stopped at 1611 by RN at bedside. Levophed started by Donnalee Curry RN at [phone] at 5 mcg/min per Dr Tamala Julian verbal order and by 1629 has been titrated up to 15 mcg/min by RN at bedside. Right groin sheath remains in place level 0. Left groin noted with pressure dressing in place C/D/I level 1  bruising noted, but soft. Total of approx 1 liter NS bolus given. Patient remained Alert and Oriented at this time with little resolve from chest pain noted, Dr Tamala Julian states taking patient back to cardiac cath lab for procedure prior to going to [facility].

## 2017-05-15 ENCOUNTER — Inpatient Hospital Stay (HOSPITAL_COMMUNITY): Payer: Medicare Other

## 2017-05-15 ENCOUNTER — Inpatient Hospital Stay (HOSPITAL_COMMUNITY): Payer: Medicare Other | Admitting: Certified Registered Nurse Anesthetist

## 2017-05-15 ENCOUNTER — Encounter (HOSPITAL_COMMUNITY): Payer: Self-pay | Admitting: Cardiology

## 2017-05-15 ENCOUNTER — Encounter (HOSPITAL_COMMUNITY)
Admission: RE | Disposition: A | Discharge status: Home / Self Care | Payer: Self-pay | Source: Ambulatory Visit | Attending: Cardiology

## 2017-05-15 DIAGNOSIS — I313 Pericardial effusion (noninflammatory): Secondary | ICD-10-CM

## 2017-05-15 DIAGNOSIS — E785 Hyperlipidemia, unspecified: Secondary | ICD-10-CM | POA: Diagnosis not present

## 2017-05-15 DIAGNOSIS — I25110 Atherosclerotic heart disease of native coronary artery with unstable angina pectoris: Secondary | ICD-10-CM

## 2017-05-15 DIAGNOSIS — I25119 Atherosclerotic heart disease of native coronary artery with unspecified angina pectoris: Secondary | ICD-10-CM | POA: Diagnosis not present

## 2017-05-15 DIAGNOSIS — I312 Hemopericardium, not elsewhere classified: Secondary | ICD-10-CM | POA: Diagnosis not present

## 2017-05-15 DIAGNOSIS — I10 Essential (primary) hypertension: Secondary | ICD-10-CM

## 2017-05-15 DIAGNOSIS — I3139 Other pericardial effusion (noninflammatory): Secondary | ICD-10-CM | POA: Diagnosis present

## 2017-05-15 DIAGNOSIS — I2510 Atherosclerotic heart disease of native coronary artery without angina pectoris: Secondary | ICD-10-CM

## 2017-05-15 HISTORY — PX: SUBXYPHOID PERICARDIAL WINDOW: SHX5075

## 2017-05-15 LAB — ECHOCARDIOGRAM LIMITED
Height: 71 in
Height: 71 in
WEIGHTICAEL: 3597.91 [oz_av]
Weight: 3597.91 oz

## 2017-05-15 LAB — CBC
HCT: 41.9 % (ref 39.0–52.0)
HEMOGLOBIN: 14.4 g/dL (ref 13.0–17.0)
MCH: 31.1 pg (ref 26.0–34.0)
MCHC: 34.4 g/dL (ref 30.0–36.0)
MCV: 90.5 fL (ref 78.0–100.0)
Platelets: 190 10*3/uL (ref 150–400)
RBC: 4.63 MIL/uL (ref 4.22–5.81)
RDW: 13 % (ref 11.5–15.5)
WBC: 11.9 10*3/uL — ABNORMAL HIGH (ref 4.0–10.5)

## 2017-05-15 LAB — BASIC METABOLIC PANEL
ANION GAP: 8 (ref 5–15)
BUN: 7 mg/dL (ref 6–20)
CHLORIDE: 99 mmol/L — AB (ref 101–111)
CO2: 25 mmol/L (ref 22–32)
Calcium: 8.7 mg/dL — ABNORMAL LOW (ref 8.9–10.3)
Creatinine, Ser: 0.85 mg/dL (ref 0.61–1.24)
GFR calc non Af Amer: >60 mL/min (ref >60)
Glucose, Bld: 94 mg/dL (ref 65–99)
POTASSIUM: 4.7 mmol/L (ref 3.5–5.1)
SODIUM: 132 mmol/L — AB (ref 135–145)

## 2017-05-15 LAB — PROTEIN, BODY FLUID (OTHER): Total Protein, Body Fluid Other: 15.4 g/dL

## 2017-05-15 LAB — MRSA PCR SCREENING: MRSA BY PCR: NEGATIVE

## 2017-05-15 LAB — PREPARE RBC (CROSSMATCH)

## 2017-05-15 LAB — GLUCOSE, BODY FLUID OTHER: GLUCOSE, BODY FLUID OTHER: 4 mg/dL

## 2017-05-15 LAB — LD, BODY FLUID (OTHER): LD, Body Fluid: 1535 IU/L

## 2017-05-15 LAB — ABO/RH: ABO/RH(D): O NEG

## 2017-05-15 SURGERY — CREATION, PERICARDIAL WINDOW, SUBXIPHOID APPROACH
Anesthesia: General

## 2017-05-15 MED ORDER — HEPARIN SODIUM (PORCINE) 1000 UNIT/ML IJ SOLN
INTRAMUSCULAR | Status: AC
  Filled 2017-05-15: qty 4

## 2017-05-15 MED ORDER — ACETAMINOPHEN 500 MG PO TABS
1000.0000 mg | ORAL_TABLET | Freq: Four times a day (QID) | ORAL | Status: DC
  Administered 2017-05-15 – 2017-05-18 (×10): 1000 mg via ORAL
  Filled 2017-05-15 (×8): qty 2

## 2017-05-15 MED ORDER — PHENYLEPHRINE HCL 10 MG/ML IJ SOLN
INTRAVENOUS | Status: DC | PRN
  Administered 2017-05-15: 40 ug/min via INTRAVENOUS

## 2017-05-15 MED ORDER — DEXMEDETOMIDINE HCL IN NACL 400 MCG/100ML IV SOLN
0.1000 ug/kg/h | INTRAVENOUS | Status: DC
  Filled 2017-05-15: qty 100

## 2017-05-15 MED ORDER — ALBUMIN HUMAN 5 % IV SOLN
INTRAVENOUS | Status: DC | PRN
  Administered 2017-05-15 (×2): via INTRAVENOUS

## 2017-05-15 MED ORDER — OXYCODONE HCL 5 MG PO TABS
5.0000 mg | ORAL_TABLET | ORAL | Status: DC | PRN
  Administered 2017-05-16 – 2017-05-17 (×2): 10 mg via ORAL
  Filled 2017-05-15 (×2): qty 2

## 2017-05-15 MED ORDER — FENTANYL CITRATE (PF) 100 MCG/2ML IJ SOLN: 25.0000 ug | INTRAMUSCULAR | Status: DC | PRN

## 2017-05-15 MED ORDER — DEXAMETHASONE SODIUM PHOSPHATE 10 MG/ML IJ SOLN
INTRAMUSCULAR | Status: DC | PRN
  Administered 2017-05-15: 5 mg via INTRAVENOUS

## 2017-05-15 MED ORDER — DEXTROSE 5 % IV SOLN
750.0000 mg | INTRAVENOUS | Status: DC
  Filled 2017-05-15: qty 750

## 2017-05-15 MED ORDER — ONDANSETRON HCL 4 MG/2ML IJ SOLN
INTRAMUSCULAR | Status: AC
  Filled 2017-05-15: qty 2

## 2017-05-15 MED ORDER — NITROGLYCERIN IN D5W 200-5 MCG/ML-% IV SOLN
2.0000 ug/min | INTRAVENOUS | Status: DC
  Filled 2017-05-15: qty 250

## 2017-05-15 MED ORDER — SODIUM CHLORIDE 0.9 % IV SOLN: Freq: Once | INTRAVENOUS | Status: DC

## 2017-05-15 MED ORDER — DOPAMINE-DEXTROSE 3.2-5 MG/ML-% IV SOLN
0.0000 ug/kg/min | INTRAVENOUS | Status: DC
Start: 2017-05-15 — End: 2017-05-15
  Filled 2017-05-15: qty 250

## 2017-05-15 MED ORDER — ACETAMINOPHEN 160 MG/5ML PO SOLN: 1000.0000 mg | Freq: Four times a day (QID) | ORAL | Status: DC

## 2017-05-15 MED ORDER — FENTANYL CITRATE (PF) 250 MCG/5ML IJ SOLN
INTRAMUSCULAR | Status: AC
  Filled 2017-05-15: qty 20

## 2017-05-15 MED ORDER — SUGAMMADEX SODIUM 200 MG/2ML IV SOLN
INTRAVENOUS | Status: AC
  Filled 2017-05-15: qty 2

## 2017-05-15 MED ORDER — ONDANSETRON HCL 4 MG/2ML IJ SOLN: 4.0000 mg | Freq: Four times a day (QID) | INTRAMUSCULAR | Status: DC | PRN

## 2017-05-15 MED ORDER — EPINEPHRINE PF 1 MG/ML IJ SOLN
0.0000 ug/min | INTRAMUSCULAR | Status: DC
  Filled 2017-05-15: qty 4

## 2017-05-15 MED ORDER — SUCCINYLCHOLINE CHLORIDE 200 MG/10ML IV SOSY
PREFILLED_SYRINGE | INTRAVENOUS | Status: AC
  Filled 2017-05-15: qty 20

## 2017-05-15 MED ORDER — SODIUM CHLORIDE 0.9 % IV SOLN
30.0000 ug/min | INTRAVENOUS | Status: DC
  Filled 2017-05-15: qty 2

## 2017-05-15 MED ORDER — POTASSIUM CHLORIDE 2 MEQ/ML IV SOLN
80.0000 meq | INTRAVENOUS | Status: DC
  Filled 2017-05-15: qty 40

## 2017-05-15 MED ORDER — PLASMA-LYTE 148 IV SOLN
INTRAVENOUS | Status: DC
  Filled 2017-05-15: qty 2.5

## 2017-05-15 MED ORDER — MAGNESIUM SULFATE 50 % IJ SOLN
40.0000 meq | INTRAMUSCULAR | Status: DC
  Filled 2017-05-15: qty 10

## 2017-05-15 MED ORDER — DEXAMETHASONE SODIUM PHOSPHATE 10 MG/ML IJ SOLN
INTRAMUSCULAR | Status: AC
  Filled 2017-05-15: qty 1

## 2017-05-15 MED ORDER — ARTIFICIAL TEARS OPHTHALMIC OINT
TOPICAL_OINTMENT | OPHTHALMIC | Status: DC | PRN
  Administered 2017-05-15: 1 via OPHTHALMIC

## 2017-05-15 MED ORDER — DEXTROSE 5 % IV SOLN
1.5000 g | INTRAVENOUS | Status: AC
  Administered 2017-05-15: 1.5 g via INTRAVENOUS
  Filled 2017-05-15 (×5): qty 1.5

## 2017-05-15 MED ORDER — VANCOMYCIN HCL 10 G IV SOLR
1250.0000 mg | INTRAVENOUS | Status: DC
  Filled 2017-05-15: qty 1250

## 2017-05-15 MED ORDER — ONDANSETRON HCL 4 MG/2ML IJ SOLN
INTRAMUSCULAR | Status: DC | PRN
  Administered 2017-05-15: 4 mg via INTRAVENOUS

## 2017-05-15 MED ORDER — ETOMIDATE 2 MG/ML IV SOLN
INTRAVENOUS | Status: DC | PRN
  Administered 2017-05-15: 16 mg via INTRAVENOUS

## 2017-05-15 MED ORDER — FENTANYL CITRATE (PF) 100 MCG/2ML IJ SOLN
INTRAMUSCULAR | Status: DC | PRN
  Administered 2017-05-15 (×5): 50 ug via INTRAVENOUS
  Administered 2017-05-15: 100 ug via INTRAVENOUS

## 2017-05-15 MED ORDER — LACTATED RINGERS IV SOLN
INTRAVENOUS | Status: DC | PRN
  Administered 2017-05-15: 15:00:00 via INTRAVENOUS

## 2017-05-15 MED ORDER — SUGAMMADEX SODIUM 200 MG/2ML IV SOLN
INTRAVENOUS | Status: DC | PRN
  Administered 2017-05-15: 200 mg via INTRAVENOUS

## 2017-05-15 MED ORDER — ROCURONIUM BROMIDE 10 MG/ML (PF) SYRINGE
PREFILLED_SYRINGE | INTRAVENOUS | Status: AC
  Filled 2017-05-15: qty 5

## 2017-05-15 MED ORDER — DEXTROSE 5 % IV SOLN
1.5000 g | Freq: Two times a day (BID) | INTRAVENOUS | Status: AC
  Administered 2017-05-15 – 2017-05-16 (×2): 1.5 g via INTRAVENOUS
  Filled 2017-05-15 (×2): qty 1.5

## 2017-05-15 MED ORDER — ROCURONIUM BROMIDE 10 MG/ML (PF) SYRINGE
PREFILLED_SYRINGE | INTRAVENOUS | Status: AC
  Filled 2017-05-15: qty 15

## 2017-05-15 MED ORDER — TRANEXAMIC ACID (OHS) BOLUS VIA INFUSION
15.0000 mg/kg | INTRAVENOUS | Status: DC
  Filled 2017-05-15: qty 1530

## 2017-05-15 MED ORDER — SENNOSIDES-DOCUSATE SODIUM 8.6-50 MG PO TABS
1.0000 | ORAL_TABLET | Freq: Every day | ORAL | Status: DC
  Administered 2017-05-16 – 2017-05-17 (×2): 1 via ORAL
  Filled 2017-05-15 (×2): qty 1

## 2017-05-15 MED ORDER — MIDAZOLAM HCL 5 MG/5ML IJ SOLN
INTRAMUSCULAR | Status: DC | PRN
  Administered 2017-05-15 (×2): 1 mg via INTRAVENOUS

## 2017-05-15 MED ORDER — ETOMIDATE 2 MG/ML IV SOLN
INTRAVENOUS | Status: AC
  Filled 2017-05-15: qty 10

## 2017-05-15 MED ORDER — MORPHINE SULFATE (PF) 4 MG/ML IV SOLN
2.0000 mg | INTRAVENOUS | Status: DC | PRN
  Administered 2017-05-15 – 2017-05-16 (×2): 2 mg via INTRAVENOUS
  Filled 2017-05-15 (×2): qty 1

## 2017-05-15 MED ORDER — PROPOFOL 10 MG/ML IV BOLUS
INTRAVENOUS | Status: DC | PRN
  Administered 2017-05-15: 30 mg via INTRAVENOUS

## 2017-05-15 MED ORDER — SODIUM CHLORIDE 0.9 % IV BOLUS (SEPSIS)
1000.0000 mL | Freq: Once | INTRAVENOUS | Status: AC
  Administered 2017-05-15: 1000 mL via INTRAVENOUS

## 2017-05-15 MED ORDER — INSULIN REGULAR HUMAN 100 UNIT/ML IJ SOLN
INTRAMUSCULAR | Status: DC
  Filled 2017-05-15: qty 1

## 2017-05-15 MED ORDER — LIDOCAINE 2% (20 MG/ML) 5 ML SYRINGE
INTRAMUSCULAR | Status: AC
  Filled 2017-05-15: qty 10

## 2017-05-15 MED ORDER — ARTIFICIAL TEARS OPHTHALMIC OINT
TOPICAL_OINTMENT | OPHTHALMIC | Status: AC
  Filled 2017-05-15: qty 7

## 2017-05-15 MED ORDER — DEXTROSE 5 % IV SOLN: 1.5000 g | INTRAVENOUS | Status: DC

## 2017-05-15 MED ORDER — LIDOCAINE 2% (20 MG/ML) 5 ML SYRINGE
INTRAMUSCULAR | Status: DC | PRN
  Administered 2017-05-15: 80 mg via INTRAVENOUS

## 2017-05-15 MED ORDER — CEFUROXIME SODIUM 1.5 G IV SOLR: 1.5000 g | INTRAVENOUS | Status: DC

## 2017-05-15 MED ORDER — SODIUM CHLORIDE 0.9% FLUSH: 10.0000 mL | INTRAVENOUS | Status: DC | PRN

## 2017-05-15 MED ORDER — ROCURONIUM BROMIDE 10 MG/ML (PF) SYRINGE
PREFILLED_SYRINGE | INTRAVENOUS | Status: DC | PRN
  Administered 2017-05-15: 30 mg via INTRAVENOUS

## 2017-05-15 MED ORDER — MIDAZOLAM HCL 2 MG/2ML IJ SOLN
INTRAMUSCULAR | Status: AC
  Filled 2017-05-15: qty 2

## 2017-05-15 MED ORDER — POTASSIUM CHLORIDE 10 MEQ/50ML IV SOLN: 10.0000 meq | Freq: Every day | INTRAVENOUS | Status: DC | PRN

## 2017-05-15 MED ORDER — 0.9 % SODIUM CHLORIDE (POUR BTL) OPTIME
TOPICAL | Status: DC | PRN
  Administered 2017-05-15: 1000 mL

## 2017-05-15 MED ORDER — SUCCINYLCHOLINE CHLORIDE 200 MG/10ML IV SOSY
PREFILLED_SYRINGE | INTRAVENOUS | Status: DC | PRN
  Administered 2017-05-15: 140 mg via INTRAVENOUS

## 2017-05-15 MED ORDER — TRANEXAMIC ACID 1000 MG/10ML IV SOLN
1.5000 mg/kg/h | INTRAVENOUS | Status: DC
  Filled 2017-05-15: qty 25

## 2017-05-15 MED ORDER — LACTATED RINGERS IV SOLN
INTRAVENOUS | Status: DC | PRN
  Administered 2017-05-15: 16:00:00 via INTRAVENOUS

## 2017-05-15 MED ORDER — CHLORHEXIDINE GLUCONATE CLOTH 2 % EX PADS: 6.0000 | MEDICATED_PAD | Freq: Every day | CUTANEOUS | Status: DC

## 2017-05-15 MED ORDER — BISACODYL 5 MG PO TBEC
10.0000 mg | DELAYED_RELEASE_TABLET | Freq: Every day | ORAL | Status: DC
  Administered 2017-05-15 – 2017-05-18 (×4): 10 mg via ORAL
  Filled 2017-05-15 (×4): qty 2

## 2017-05-15 MED ORDER — HEPARIN SODIUM (PORCINE) 1000 UNIT/ML IJ SOLN
INTRAMUSCULAR | Status: DC
  Filled 2017-05-15: qty 30

## 2017-05-15 MED ORDER — SODIUM CHLORIDE 0.9% FLUSH
10.0000 mL | Freq: Two times a day (BID) | INTRAVENOUS | Status: DC
  Administered 2017-05-15: 10 mL

## 2017-05-15 MED ORDER — NOREPINEPHRINE BITARTRATE 1 MG/ML IV SOLN
0.0000 ug/min | INTRAVENOUS | Status: DC
  Filled 2017-05-15: qty 4

## 2017-05-15 MED ORDER — TRANEXAMIC ACID (OHS) PUMP PRIME SOLUTION
2.0000 mg/kg | INTRAVENOUS | Status: DC
  Filled 2017-05-15: qty 2.04

## 2017-05-15 MED ORDER — PROPOFOL 10 MG/ML IV BOLUS
INTRAVENOUS | Status: AC
  Filled 2017-05-15: qty 20

## 2017-05-15 MED ORDER — DEXTROSE-NACL 5-0.9 % IV SOLN
INTRAVENOUS | Status: DC
  Administered 2017-05-15 – 2017-05-16 (×2): via INTRAVENOUS

## 2017-05-15 MED FILL — Lidocaine HCl Local Inj 2%: INTRAMUSCULAR | Qty: 20 | Status: AC

## 2017-05-15 MED FILL — Heparin Sodium (Porcine) Inj 1000 Unit/ML: INTRAMUSCULAR | Qty: 10 | Status: AC

## 2017-05-15 MED FILL — Atropine Sulfate Soln Prefill Syr 1 MG/10ML (0.1 MG/ML): INTRAMUSCULAR | Qty: 10 | Status: AC

## 2017-05-15 SURGICAL SUPPLY — 53 items
CANISTER SUCT 3000ML PPV (MISCELLANEOUS) ×3 IMPLANT
CATH THORACIC 28FR (CATHETERS) IMPLANT
CATH THORACIC 28FR RT ANG (CATHETERS) IMPLANT
CATH THORACIC 36FR (CATHETERS) IMPLANT
CATH THORACIC 36FR RT ANG (CATHETERS) ×3 IMPLANT
CONT SPEC 4OZ CLIKSEAL STRL BL (MISCELLANEOUS) ×3 IMPLANT
DRAIN CHANNEL 28F RND 3/8 FF (WOUND CARE) IMPLANT
DRAPE LAPAROSCOPIC ABDOMINAL (DRAPES) ×3 IMPLANT
DRAPE SLUSH/WARMER DISC (DRAPES) ×3 IMPLANT
ELECT BLADE 4.0 EZ CLEAN MEGAD (MISCELLANEOUS) ×3
ELECT CAUTERY BLADE 6.4 (BLADE) ×3 IMPLANT
ELECT REM PT RETURN 9FT ADLT (ELECTROSURGICAL) ×3
ELECTRODE BLDE 4.0 EZ CLN MEGD (MISCELLANEOUS) ×1 IMPLANT
ELECTRODE REM PT RTRN 9FT ADLT (ELECTROSURGICAL) ×1 IMPLANT
FELT TEFLON 1X6 (MISCELLANEOUS) ×3 IMPLANT
GAUZE SPONGE 4X4 12PLY STRL (GAUZE/BANDAGES/DRESSINGS) ×3 IMPLANT
GAUZE SPONGE 4X4 12PLY STRL LF (GAUZE/BANDAGES/DRESSINGS) ×3 IMPLANT
GLOVE BIOGEL PI IND STRL 6.5 (GLOVE) ×4 IMPLANT
GLOVE BIOGEL PI INDICATOR 6.5 (GLOVE) ×8
GLOVE EUDERMIC 7 POWDERFREE (GLOVE) ×3 IMPLANT
GOWN STRL REUS W/ TWL LRG LVL3 (GOWN DISPOSABLE) ×1 IMPLANT
GOWN STRL REUS W/ TWL XL LVL3 (GOWN DISPOSABLE) ×1 IMPLANT
GOWN STRL REUS W/TWL LRG LVL3 (GOWN DISPOSABLE) ×2
GOWN STRL REUS W/TWL XL LVL3 (GOWN DISPOSABLE) ×2
HEMOSTAT POWDER SURGIFOAM 1G (HEMOSTASIS) IMPLANT
KIT BASIN OR (CUSTOM PROCEDURE TRAY) ×3 IMPLANT
KIT ROOM TURNOVER OR (KITS) ×3 IMPLANT
NS IRRIG 1000ML POUR BTL (IV SOLUTION) ×3 IMPLANT
PACK CHEST (CUSTOM PROCEDURE TRAY) ×3 IMPLANT
PAD ARMBOARD 7.5X6 YLW CONV (MISCELLANEOUS) ×6 IMPLANT
PAD ELECT DEFIB RADIOL ZOLL (MISCELLANEOUS) ×3 IMPLANT
SUT SILK  1 MH (SUTURE) ×2
SUT SILK 1 MH (SUTURE) ×1 IMPLANT
SUT SILK 1 TIES 10X30 (SUTURE) ×3 IMPLANT
SUT SILK 2 0 SH CR/8 (SUTURE) ×3 IMPLANT
SUT VIC AB 0 CT1 18XCR BRD 8 (SUTURE) ×1 IMPLANT
SUT VIC AB 0 CT1 8-18 (SUTURE) ×2
SUT VIC AB 1 CTX 18 (SUTURE) ×3 IMPLANT
SUT VIC AB 2-0 CT1 27 (SUTURE) ×2
SUT VIC AB 2-0 CT1 TAPERPNT 27 (SUTURE) ×1 IMPLANT
SUT VIC AB 3-0 X1 27 (SUTURE) ×3 IMPLANT
SWAB COLLECTION DEVICE MRSA (MISCELLANEOUS) IMPLANT
SWAB CULTURE ESWAB REG 1ML (MISCELLANEOUS) IMPLANT
SYR 10ML LL (SYRINGE) IMPLANT
SYR 50ML SLIP (SYRINGE) IMPLANT
SYSTEM SAHARA CHEST DRAIN ATS (WOUND CARE) ×3 IMPLANT
TAPE CLOTH SURG 4X10 WHT LF (GAUZE/BANDAGES/DRESSINGS) ×3 IMPLANT
TOWEL GREEN STERILE (TOWEL DISPOSABLE) ×6 IMPLANT
TOWEL OR 17X24 6PK STRL BLUE (TOWEL DISPOSABLE) IMPLANT
TOWEL OR 17X26 10 PK STRL BLUE (TOWEL DISPOSABLE) IMPLANT
TRAP SPECIMEN MUCOUS 40CC (MISCELLANEOUS) ×6 IMPLANT
TRAY FOLEY W/METER SILVER 16FR (SET/KITS/TRAYS/PACK) ×3 IMPLANT
WATER STERILE IRR 1000ML POUR (IV SOLUTION) ×6 IMPLANT

## 2017-05-15 NOTE — Anesthesia Procedure Notes (Signed)
Procedure Name: Intubation Date/Time: 05/15/2017 3:38 PM Performed by: Melina Copa, Shiah Berhow R Pre-anesthesia Checklist: Patient identified, Emergency Drugs available, Suction available and Patient being monitored Patient Re-evaluated:Patient Re-evaluated prior to induction Oxygen Delivery Method: Circle System Utilized Preoxygenation: Pre-oxygenation with 100% oxygen Induction Type: IV induction, Rapid sequence and Cricoid Pressure applied Ventilation: Mask ventilation without difficulty Laryngoscope Size: Mac and 4 Grade View: Grade I Tube type: Subglottic suction tube Tube size: 8.0 mm Number of attempts: 1 Airway Equipment and Method: Stylet and Oral airway Placement Confirmation: ETT inserted through vocal cords under direct vision,  positive ETCO2 and breath sounds checked- equal and bilateral Secured at: 22 cm Tube secured with: Tape Dental Injury: Teeth and Oropharynx as per pre-operative assessment

## 2017-05-15 NOTE — Transfer of Care (Signed)
Immediate Anesthesia Transfer of Care Note  Patient: Joseph Mercado  Procedure(s) Performed: Procedure(s): SUBXYPHOID PERICARDIAL WINDOW WITH DRAINAGE OF PERICARDIAL EFFUSION (N/A)  Patient Location: ICU  Anesthesia Type:General  Level of Consciousness: awake, oriented and patient cooperative  Airway & Oxygen Therapy: Patient Spontanous Breathing and Patient connected to nasal cannula oxygen  Post-op Assessment: Report given to RN, Post -op Vital signs reviewed and stable and Patient moving all extremities  Post vital signs: Reviewed and stable  Last Vitals:  Vitals:   05/15/17 1450 05/15/17 1455  BP: (!) 92/51 (!) 86/58  Pulse: 96 96  Resp: (!) 22 20  Temp:    SpO2: 97% 98%    Last Pain:  Vitals:   05/15/17 1330  TempSrc:   PainSc: 0-No pain      Patients Stated Pain Goal: 0 (19/59/74 7185)  Complications: No apparent anesthesia complications

## 2017-05-15 NOTE — Anesthesia Preprocedure Evaluation (Signed)
Anesthesia Evaluation  Patient identified by MRN, date of birth, ID band Patient awake    Reviewed: Allergy & Precautions, NPO status , Patient's Chart, lab work & pertinent test results  Airway Mallampati: I  TM Distance: >3 FB     Dental   Pulmonary shortness of breath, former smoker,    breath sounds clear to auscultation       Cardiovascular hypertension, + angina + CAD and + Past MI   Rhythm:Regular Rate:Normal     Neuro/Psych    GI/Hepatic Neg liver ROS, GERD  ,  Endo/Other  negative endocrine ROS  Renal/GU negative Renal ROS     Musculoskeletal   Abdominal   Peds  Hematology   Anesthesia Other Findings   Reproductive/Obstetrics                             Anesthesia Physical Anesthesia Plan  ASA: IV  Anesthesia Plan: General   Post-op Pain Management:    Induction: Intravenous  PONV Risk Score and Plan: 2 and Ondansetron, Dexamethasone and Treatment may vary due to age or medical condition  Airway Management Planned: Oral ETT  Additional Equipment: Arterial line, CVP and TEE  Intra-op Plan:   Post-operative Plan: Possible Post-op intubation/ventilation  Informed Consent: I have reviewed the patients History and Physical, chart, labs and discussed the procedure including the risks, benefits and alternatives for the proposed anesthesia with the patient or authorized representative who has indicated his/her understanding and acceptance.   Dental advisory given  Plan Discussed with: CRNA and Anesthesiologist  Anesthesia Plan Comments:         Anesthesia Quick Evaluation

## 2017-05-15 NOTE — Progress Notes (Signed)
  Echocardiogram Echocardiogram Transesophageal has been performed.  Jennette Dubin 05/15/2017, 4:41 PM

## 2017-05-15 NOTE — Progress Notes (Signed)
  Echocardiogram 2D Echocardiogram limited has been performed.  Tresa Res 05/15/2017, 2:22 PM

## 2017-05-15 NOTE — Progress Notes (Signed)
Pericardial drain removed by MD at approximately 1300.  Patient complained of 4/10 pain from left shoulder across the top of his chest, stated it hurt like he felt that he may "throw up from the pain".  50 mcg fentanyl given and BP set to cycle every 15 minutes.  Patient's blood pressure down to 56/42 (48), RN entered room and patient was diaphoretic.  Patient aroused when RN spoke his name.  MD Tamala Julian paged to bedside, fluids bolus started and levophed drip started.

## 2017-05-15 NOTE — Consult Note (Signed)
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 Daneen Schick  Joseph Mercado is an 71 y.o. male.  HPI:   The patient is a 71 year old gentleman with hypertension, hypercholesterolemia and CAD who has had a several month history of chest pain and dyspnea with exertion. His cath showed a CTO of the LCX with a moderate sized OM filling by collaterals. A gated nuclear stress showed inferior and inferolateral thinning with no ischemia and an EF of 61%. He underwent an attempt at crossing the CTO back in July at the time of his cath that was unsuccessful. He had a high radiation dose and therefore a decision was made to try crossing the lesion again several weeks later. He had repeat cath yesterday by Dr. Martinique and the CTO PCI was unsuccessful with coronary perforation and development of a large pericardial effusion that was treated with a pigtail drain. The drain did not put out much overnight and a repeat echo this am showed no significant effusion so the drain was removed. The patient then developed hypotension requiring levophed. A repeat echo now shows reaccumulation of some pericardial effusion but not as much as yesterday. There is epicardial hematoma present.   Past Medical History:  Diagnosis Date  . CAD (coronary artery disease)    a. 02/2017: NSTEMI with cath showing 100% Prox Cx stenosis with 95% Mid Cx stenosis and 45% mid-LAD stenosis. Unsuccessful attempt at crossing an occluded mid nondominant circumflex --> plan for staged PCI in coming weeks  . Chest pain 02/2017  . Dyspnea   . GERD (gastroesophageal reflux disease)   . High cholesterol   . History of gout   . Hypertension   . Macular degeneration   . NSTEMI (non-ST elevated myocardial infarction) ([place]) 02/2017    Past Surgical  History:  Procedure Laterality Date  . CATARACT EXTRACTION W/ INTRAOCULAR LENS  IMPLANT, BILATERAL Bilateral   . CORONARY ANGIOPLASTY    . CORONARY BALLOON ANGIOPLASTY N/A 03/10/2017   Procedure: Coronary Balloon Angioplasty;  Surgeon: Lorretta Harp, MD;  Location: [facility];  Service: Cardiovascular;  Laterality: N/A;  CFX  . CORONARY CTO INTERVENTION N/A 05/14/2017   Procedure: CORONARY CTO INTERVENTION;  Surgeon: Martinique, Peter M, MD;  Location: [facility];  Service: Cardiovascular;  Laterality: N/A;  . LEFT HEART CATH AND CORONARY ANGIOGRAPHY N/A 03/10/2017   Procedure: Left Heart Cath and Coronary Angiography;  Surgeon: Lorretta Harp, MD;  Location: [facility];  Service: Cardiovascular;  Laterality: N/A;  . PERICARDIOCENTESIS N/A 05/14/2017   Procedure: PERICARDIOCENTESIS;  Surgeon: Martinique, Peter M, MD;  Location: [facility];  Service: Cardiovascular;  Laterality: N/A;  . ULTRASOUND GUIDANCE FOR VASCULAR ACCESS  05/14/2017   Procedure: Ultrasound Guidance For Vascular Access;  Surgeon: Martinique, Peter M, MD;  Location: [facility];  Service: Cardiovascular;;    Family History  Problem Relation Age of Onset  . CAD Brother        CABG in his 9's    Social History:  reports that he has quit smoking. His smoking use included Cigarettes. He has a 60.00 pack-year smoking history. He has quit using smokeless tobacco. His smokeless tobacco use included Chew. He  reports that he drinks about 42.0 oz of alcohol per week . He reports that he does not use drugs.  Allergies: No Known Allergies  Medications:  I have reviewed the patient's current medications. Prior to Admission:  Prescriptions Prior to Admission  Medication Sig Dispense Refill Last Dose  . aspirin EC 81 MG EC tablet Take 1 tablet (81 mg total) by mouth daily. (Patient taking differently: Take 81 mg by mouth every evening. )   05/14/2017 at 0530  . clopidogrel (PLAVIX) 75 MG tablet Take 1  tablet (75 mg total) by mouth daily. (Patient taking differently: Take 75 mg by mouth every morning. ) 90 tablet 3 05/14/2017 at 0530  . ezetimibe (ZETIA) 10 MG tablet Take 1 tablet (10 mg total) by mouth daily. (Patient taking differently: Take 10 mg by mouth every morning. ) 30 tablet 6 05/14/2017 at 0530  . lisinopril (PRINIVIL,ZESTRIL) 20 MG tablet Take 1 tablet (20 mg total) by mouth daily. (Patient taking differently: Take 20 mg by mouth every morning. ) 90 tablet 3 05/14/2017 at 0530  . metoprolol tartrate (LOPRESSOR) 50 MG tablet Take 25 mg by mouth 2 (two) times daily.    05/14/2017 at 0530  . Multiple Vitamins-Minerals (PRESERVISION AREDS PO) Take 1 capsule by mouth 2 (two) times daily.   05/13/2017 at 2100  . nitroGLYCERIN (NITROSTAT) 0.4 MG SL tablet Place 0.4 mg under the tongue every 5 (five) minutes as needed for chest pain.    never  . pantoprazole (PROTONIX) 40 MG tablet Take 40 mg by mouth 2 (two) times daily.    05/13/2017 at 2100  . sildenafil (REVATIO) 20 MG tablet Take 100 mg by mouth as needed (prior to intercourse).   More than a month at Unknown time   Scheduled: . ezetimibe  10 mg Oral Daily  . multivitamin  1 tablet Oral BID  . pantoprazole  40 mg Oral BID  . sodium chloride flush  3 mL Intravenous Q12H   Continuous: . sodium chloride 250 mL (05/15/17 0056)  . cefUROXime (ZINACEF)  IV    . norepinephrine (LEVOPHED) Adult infusion 5 mcg/min (05/15/17 1405)   HER:DEYCXK chloride, acetaminophen, fentaNYL (SUBLIMAZE) injection, ondansetron (ZOFRAN) IV, sodium chloride flush  Results for orders placed or performed during the hospital encounter of 05/14/17 (from the past 48 hour(s))  POCT Activated clotting time     Status: None   Collection Time: 05/14/17  9:02 AM  Result Value Ref Range   Activated Clotting Time 323 seconds  POCT Activated clotting time     Status: None   Collection Time: 05/14/17  9:26 AM  Result Value Ref Range   Activated Clotting Time 268 seconds    POCT Activated clotting time     Status: None   Collection Time: 05/14/17  9:40 AM  Result Value Ref Range   Activated Clotting Time 312 seconds  POCT Activated clotting time     Status: None   Collection Time: 05/14/17 10:18 AM  Result Value Ref Range   Activated Clotting Time 329 seconds  POCT Activated clotting time     Status: None   Collection Time: 05/14/17 10:56 AM  Result Value Ref Range   Activated Clotting Time 307 seconds  POCT Activated clotting time     Status: None   Collection Time: 05/14/17 11:33 AM  Result Value Ref Range   Activated Clotting Time 274 seconds  POCT Activated clotting time     Status: None   Collection Time: 05/14/17  1:12 PM  Result Value Ref Range   Activated Clotting Time 208 seconds  POCT Activated clotting time     Status: None   Collection Time: 05/14/17  2:26 PM  Result Value Ref Range   Activated Clotting Time 164 seconds  CBC     Status: Abnormal   Collection Time: 05/14/17  4:09 PM  Result Value Ref Range   WBC 18.6 (H) 4.0 - 10.5 K/uL   RBC 4.69 4.22 - 5.81 MIL/uL   Hemoglobin 14.4 13.0 - 17.0 g/dL   HCT 41.7 39.0 - 52.0 %   MCV 88.9 78.0 - 100.0 fL   MCH 30.7 26.0 - 34.0 pg   MCHC 34.5 30.0 - 36.0 g/dL   RDW 12.8 11.5 - 15.5 %   Platelets 208 150 - 400 K/uL  Basic metabolic panel     Status: Abnormal   Collection Time: 05/14/17  4:09 PM  Result Value Ref Range   Sodium 131 (L) 135 - 145 mmol/L   Potassium 4.0 3.5 - 5.1 mmol/L   Chloride 101 101 - 111 mmol/L   CO2 21 (L) 22 - 32 mmol/L   Glucose, Bld 139 (H) 65 - 99 mg/dL   BUN 6 6 - 20 mg/dL   Creatinine, Ser 0.98 0.61 - 1.24 mg/dL   Calcium 8.3 (L) 8.9 - 10.3 mg/dL   GFR calc non Af Amer >60 >60 mL/min   GFR calc Af Amer >60 >60 mL/min    Comment: (NOTE) The eGFR has been calculated using the CKD EPI equation. This calculation has not been validated in all clinical situations. eGFR's persistently <60 mL/min signify possible Chronic Kidney Disease.    Anion gap 9 5  - 15  Body fluid cell count with differential     Status: Abnormal   Collection Time: 05/14/17  4:34 PM  Result Value Ref Range   Fluid Type-FCT PERICARDIAL     Comment: FLUID CORRECTED ON 09/26 AT 1710: PREVIOUSLY REPORTED AS PERICARDIAL SAC    Color, Fluid RED (A) YELLOW   Appearance, Fluid TURBID (A) CLEAR   WBC, Fluid 5,650 (H) 0 - 1,000 cu mm   Neutrophil Count, Fluid 67 (H) 0 - 25 %   Lymphs, Fluid 23 %   Monocyte-Macrophage-Serous Fluid 10 (L) 50 - 90 %   Eos, Fluid 0 %   Other Cells, Fluid 0 %  Body fluid culture     Status: None (Preliminary result)   Collection Time: 05/14/17  4:34 PM  Result Value Ref Range   Specimen Description PERICARDIAL SAC    Special Requests NONE    Gram Stain      ABUNDANT WBC PRESENT,BOTH PMN AND MONONUCLEAR NO ORGANISMS SEEN    Culture NO GROWTH < 24 HOURS    Report Status PENDING   Glucose, Body Fluid Other     Status: None   Collection Time: 05/14/17  4:34 PM  Result Value Ref Range   Glucose, Body Fluid Other 4 mg/dL    Comment: (NOTE) ________________________________________________________ :  Peritoneal  :       Pleural          :   Synovial     : :______________:________________________:________________: :              : Transudate :  Exudate  :                : :______________:____________:___________:________________: :  Not Estab.  :  Equal to simultaneously drawn plasma   : :  ______________:_________________________________________: The method performance specifications have not been established for this test in body fluid. The test result should be integrated into clinical context for interpretation. The reference intervals and other method performance specifications have not been established for this test. The test result should be integrated into the clinical context for interpretation. **Verified by repeat analysis** Performed At: [facility] [place], [place] [id number] Lindon Romp MD  YW:[id number]    Source of Sample PERICARDIAL     Comment: FLUID  Protein, body fluid (other)     Status: None   Collection Time: 05/14/17  4:34 PM  Result Value Ref Range   Total Protein, Body Fluid Other 15.4 g/dL    Comment: (NOTE) ________________________________________________________ :  Peritoneal  :       Pleural          :   Synovial     : :______________:________________________:________________: :              : Transudate :  Exudate  :                : :______________:____________:___________:________________: :  Not Estab.  :   <3 g/dL  :  >3 g/dL  :    <2.5 g/dL   : :______________:____________:___________:________________: The method performance specifications have not been established for this test in body fluid. The test result should be integrated into the clinical context for interpretation. The method performance specifications have not been established for this test in body fluid.  The test result should be integrated into the clinical context for interpretation. Results confirmed on dilution. Performed At: [facility] [place], [place] [id number] Lindon Romp MD KK:[id number]    Source of Sample PERICARDIAL     Comment: FLUID  LD, Body Fluid (other)     Status: None   Collection Time: 05/14/17  4:34 PM  Result Value Ref Range   Source of Sample PERICARDIAL     Comment: FLUID   LD, Body Fluid 1,535 IU/L    Comment: (NOTE) __________________________________________________________ : Peritoneal :       Pleural        : Synovial :    CSF    : :____________:______________________:__________:___________: :            : Transudate: Exudate  :          :           : :____________:___________:__________:__________:___________: : Not Estab. : <200 U/L  : >200 U/L : <240 U/L : Not Estab.: :____________:___________:__________:______________________: The method performance specifications have not been established for this test in body  fluid. The test result should be integrated into the clinical context for interpretation. The reference intervals and other method performance specifications have not been established for this test. The test result should be integrated into the clinical context for interpretation. Results confirmed on dilution. Performed At: [facility] [place], [place] [id number] Lindon Romp MD BO:[id number]   MRSA PCR Screening     Status: None   Collection Time: 05/14/17  8:17 PM  Result Value Ref Range   MRSA by PCR NEGATIVE NEGATIVE    Comment:        The GeneXpert MRSA Assay (FDA approved for NASAL specimens only), is one component of a comprehensive MRSA colonization surveillance program. It is not intended to diagnose MRSA infection nor to guide or monitor treatment for MRSA infections.   Basic metabolic panel  Status: Abnormal   Collection Time: 05/15/17  5:29 AM  Result Value Ref Range   Sodium 132 (L) 135 - 145 mmol/L   Potassium 4.7 3.5 - 5.1 mmol/L   Chloride 99 (L) 101 - 111 mmol/L   CO2 25 22 - 32 mmol/L   Glucose, Bld 94 65 - 99 mg/dL   BUN 7 6 - 20 mg/dL   Creatinine, Ser 0.85 0.61 - 1.24 mg/dL   Calcium 8.7 (L) 8.9 - 10.3 mg/dL   GFR calc non Af Amer >60 >60 mL/min   GFR calc Af Amer >60 >60 mL/min    Comment: (NOTE) The eGFR has been calculated using the CKD EPI equation. This calculation has not been validated in all clinical situations. eGFR's persistently <60 mL/min signify possible Chronic Kidney Disease.    Anion gap 8 5 - 15  CBC     Status: Abnormal   Collection Time: 05/15/17  5:29 AM  Result Value Ref Range   WBC 11.9 (H) 4.0 - 10.5 K/uL   RBC 4.63 4.22 - 5.81 MIL/uL   Hemoglobin 14.4 13.0 - 17.0 g/dL   HCT 41.9 39.0 - 52.0 %   MCV 90.5 78.0 - 100.0 fL   MCH 31.1 26.0 - 34.0 pg   MCHC 34.4 30.0 - 36.0 g/dL   RDW 13.0 11.5 - 15.5 %   Platelets 190 150 - 400 K/uL    No results found.  Review of Systems    Constitutional: Negative for diaphoresis.  Respiratory: Negative for cough and shortness of breath.   Cardiovascular: Positive for chest pain. Negative for orthopnea and PND.  Gastrointestinal: Negative for nausea and vomiting.  Neurological: Negative for dizziness and loss of consciousness.   Blood pressure (!) 128/57, pulse 89, temperature 98 F (36.7 C), temperature source Oral, resp. rate 20, height 5' 11"  (1.803 m), weight 102 kg (224 lb 13.9 oz), SpO2 100 %. Physical Exam  Constitutional: He is oriented to person, place, and time. He appears well-developed and well-nourished. No distress.  Eyes: Pupils are equal, round, and reactive to light. EOM are normal.  Cardiovascular: Normal rate, regular rhythm, normal heart sounds and intact distal pulses.   No murmur heard. Respiratory: Breath sounds normal. No respiratory distress.  GI: Soft. There is no tenderness.  Neurological: He is alert and oriented to person, place, and time.  Skin: Skin is warm and dry.  Psychiatric: He has a normal mood and affect.    Assessment/Plan:  He has reaccumulation of hemopericardium after CTO perforation yesterday and removal of the drain this am. He is hypotensive and requiring levophed. I think the safest option is to perform subxyphoid pericardial window to allow complete drainage of the effusion and if there is active bleeding he may require a sternotomy for repair of the bleeding site. I discussed the operative procedure with the patient and family including alternatives, benefits and risks; including but not limited to bleeding, blood transfusion, infection, myocardial infarction, organ dysfunction, and death.  Joseph Mercado understands and agrees to proceed.    I spent 40 minutes performing this consultation and > 50% of this time was spent face to face counseling and coordinating the care of this patient's hemopericardium.  Gaye Pollack 05/15/2017, 2:19 PM

## 2017-05-15 NOTE — Anesthesia Procedure Notes (Addendum)
Central Venous Catheter Insertion Performed by: Belinda Block, anesthesiologist Start/End9/27/2018 3:20 PM, 05/15/2017 3:35 PM Patient location: OR. Preanesthetic checklist: patient identified, IV checked, site marked, risks and benefits discussed, surgical consent, monitors and equipment checked, pre-op evaluation and timeout performed Position: Trendelenburg Lidocaine 1% used for infiltration Hand hygiene performed , maximum sterile barriers used  and Seldinger technique used Catheter size: 8.5 Fr Central line was placed.Sheath introducer Procedure performed using ultrasound guided technique. Ultrasound Notes:image(s) printed for medical record Attempts: 1 Following insertion, line sutured, dressing applied and Biopatch. Post procedure assessment: blood return through all ports  Patient tolerated the procedure well with no immediate complications.

## 2017-05-15 NOTE — Progress Notes (Signed)
MD Bartle advised okay to defer dangle and ambulation until 9/28.

## 2017-05-15 NOTE — Anesthesia Procedure Notes (Signed)
Arterial Line Insertion Start/End9/27/2018 3:25 PM, 05/15/2017 3:26 PM Performed by: Garrison Columbus T  Preanesthetic checklist: patient identified, IV checked, site marked, risks and benefits discussed, surgical consent, monitors and equipment checked, pre-op evaluation and anesthesia consent Lidocaine 1% used for infiltration Left, radial was placed Catheter size: 20 G Hand hygiene performed  and maximum sterile barriers used   Attempts: 1 Procedure performed without using ultrasound guided technique. Ultrasound Notes:anatomy identified and no ultrasound evidence of intravascular and/or intraneural injection Following insertion, dressing applied and Biopatch. Post procedure assessment: normal  Patient tolerated the procedure well with no immediate complications.

## 2017-05-15 NOTE — Brief Op Note (Addendum)
05/14/2017 - 05/15/2017  4:50 PM  PATIENT:  Joseph Mercado  71 y.o. male  PRE-OPERATIVE DIAGNOSIS:  angina pectoris, hemopericardium  POST-OPERATIVE DIAGNOSIS:  angina pectoris, hemopericardium  PROCEDURE:  Procedure(s): SUBXYPHOID PERICARDIAL WINDOW WITH DRAINAGE OF PERICARDIAL EFFUSION (N/A)  SURGEON:  Surgeon(s) and Role:    * Gaye Pollack, MD - Primary  PHYSICIAN ASSISTANT: none  ASSISTANTS: Dineen Kid, RNFA  ANESTHESIA:   general   Findings: Only about 100 cc of bloody fluid drained with rise in BP. No active bleeding.    EBL:  Total I/O In: 1998.6 [P.O.:720; I.V.:128.6; IV Piggyback:1150] Out: 925 [Urine:925]  BLOOD ADMINISTERED:none  DRAINS: one 108F  Chest Tube(s) in the pericardium   LOCAL MEDICATIONS USED:  NONE  SPECIMEN:  No Specimen  DISPOSITION OF SPECIMEN:  N/A  COUNTS:  YES  TOURNIQUET:  * No tourniquets in log *  DICTATION: .Note written in EPIC  PLAN OF CARE: Admit to inpatient   PATIENT DISPOSITION:  ICU - extubated and stable.   Delay start of Pharmacological VTE agent (>24hrs) due to surgical blood loss or risk of bleeding: yes

## 2017-05-15 NOTE — Progress Notes (Addendum)
CARDIOLOGY  Proximally 20 minutes after the pericardial drain was pulled the patient developed progressive shock with blood pressures decreasing to 50 mmHg systolic accompanied by severe diaphoresis and recurrence of mild to moderate chest discomfort.  Stat echo was personally reviewed and demonstrated clear evidence of epicardial/intramural hematoma as well as recurrent small pericardial effusion. The pericardial effusion was new and larger when compared to the earlier study done today.  High flow IV fluids and Levophed was started with improvement in blood pressure into the 120 mmHg range.  Overall impression is intramural/soft epicardial free wall hematoma and reaccumulation of pericardial effusion, producing increased intrapericardial pressure and time benign physiology.  Consult with thoracic/cardiac surgery, Dr. Cyndia Bent to consider surgical drainage/window.  Critical care time 30 minutes.

## 2017-05-15 NOTE — Progress Notes (Signed)
Patient ID: Joseph Mercado, male   DOB: 1945/11/08, 71 y.o.   MRN: [id number]  ICU Evening Rounds  He is hemodynamically stable on no pressors. MAP 70-80.  Minimal pericardial tube output  sats 97%  Awake and alert. Pain under control

## 2017-05-15 NOTE — Anesthesia Postprocedure Evaluation (Signed)
Anesthesia Post Note  Patient: Joseph Mercado  Procedure(s) Performed: Procedure(s) (LRB): SUBXYPHOID PERICARDIAL WINDOW WITH DRAINAGE OF PERICARDIAL EFFUSION (N/A)     Patient location during evaluation: SICU Anesthesia Type: General Level of consciousness: awake and alert Pain management: pain level controlled Vital Signs Assessment: post-procedure vital signs reviewed and stable Respiratory status: spontaneous breathing, nonlabored ventilation, respiratory function stable and patient connected to nasal cannula oxygen Cardiovascular status: blood pressure returned to baseline and stable Postop Assessment: no apparent nausea or vomiting Anesthetic complications: no    Last Vitals:  Vitals:   05/15/17 1720 05/15/17 1730  BP: (!) 108/55   Pulse:  (!) 110  Resp: 16 15  Temp: 36.6 C   SpO2: 96% 96%    Last Pain:  Vitals:   05/15/17 1720  TempSrc: Oral  PainSc: 0-No pain                 Tiajuana Amass

## 2017-05-15 NOTE — Progress Notes (Signed)
Progress Note  Patient Name: Joseph Mercado Date of Encounter: 05/15/2017 Quay Burow Primary Cardiologist: Levora Angel Martinique  Subjective   Feels much better today. No chest discomfort. Denies dyspnea. In a jovial mood. No arrhythmias overnight. Minimal if any drainage from the pericardial catheter  Inpatient Medications    Scheduled Meds: . ezetimibe  10 mg Oral Daily  . multivitamin  1 tablet Oral BID  . pantoprazole  40 mg Oral BID  . sodium chloride flush  3 mL Intravenous Q12H   Continuous Infusions: . sodium chloride 250 mL (05/15/17 0056)   PRN Meds: sodium chloride, acetaminophen, fentaNYL (SUBLIMAZE) injection, ondansetron (ZOFRAN) IV, sodium chloride flush   Vital Signs    Vitals:   05/15/17 1000 05/15/17 1155 05/15/17 1200 05/15/17 1231  BP: 122/60 131/73 126/74 (!) 99/57  Pulse: 81 77 (!) 113 (!) 55  Resp: (!) 21 (!) 26 (!) 21 16  Temp:  97.7 F (36.5 C)  98 F (36.7 C)  TempSrc:  Oral  Oral  SpO2: 96% 99% 96% 98%  Weight:      Height:        Intake/Output Summary (Last 24 hours) at 05/15/17 1258 Last data filed at 05/15/17 1200  Gross per 24 hour  Intake          4477.07 ml  Output             2030 ml  Net          2447.07 ml   Filed Weights   05/14/17 0624 05/14/17 2015  Weight: 225 lb (102.1 kg) 224 lb 13.9 oz (102 kg)    Telemetry    Normal sinus rhythm without atrial or ventricular arrhythmia. - Personally Reviewed  ECG    Normal sinus rhythm with left axis deviation and probable inferior Q waves. No change from baseline tracings earlier this year. - Personally Reviewed  Physical Exam  Obese, good skin color. GEN: No acute distress.   Neck: No JVD Cardiac: RRR. 2 component pericardial friction rub is audible left midsternal border. Respiratory: Clear to auscultation bilaterally. GI: Soft, nontender, non-distended. The subxiphoid site of paracardial drain is without hematoma.  MS: No edema; No deformity. Neuro:   Nonfocal  Psych: Normal affect   Labs    Chemistry Recent Labs Lab 05/14/17 1609 05/15/17 0529  NA 131* 132*  K 4.0 4.7  CL 101 99*  CO2 21* 25  GLUCOSE 139* 94  BUN 6 7  CREATININE 0.98 0.85  CALCIUM 8.3* 8.7*  GFRNONAA >60 >60  GFRAA >60 >60  ANIONGAP 9 8     Hematology Recent Labs Lab 05/14/17 1609 05/15/17 0529  WBC 18.6* 11.9*  RBC 4.69 4.63  HGB 14.4 14.4  HCT 41.7 41.9  MCV 88.9 90.5  MCH 30.7 31.1  MCHC 34.5 34.4  RDW 12.8 13.0  PLT 208 190    Cardiac EnzymesNo results for input(s): TROPONINI in the last 168 hours. No results for input(s): TROPIPOC in the last 168 hours.   BNPNo results for input(s): BNP, PROBNP in the last 168 hours.   DDimer No results for input(s): DDIMER in the last 168 hours.   Radiology    No results found.  Cardiac Studies   Echocardiogram performed this morning has been personally reviewed. No significant pericardial effusion is present currently. There is still apical region adherent hematoma. RV size, inferior vena cava, and LV appeared normal  Patient Profile     71 y.o. male chronic coronary disease,  chronic total occlusion of the circumflex, who underwent CTO PCI 05/14/2017. Procedure complicated by delayed acute pericardial hemorrhage producing tamponade. He was managed by pericardiocentesis and drainage by Dr. Martinique.   PROCEDURE: Removal of pericardial drain Bandage was removed. The subxiphoid area was sterilely prepared. The drain was removed without significant difficulty. Post removal bleeding required a 5 minute hold with moderate pressure. Hemostasis was achieved. A sterile dressing was applied. The patient will remain flat for 2 hours and then be able to sit up with plan for ambulation later today.  Assessment & Plan    1. 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 2. CTO PCI unsuccessful and associated with pericardial hemorrhage resulting in  tamponade. Pericardial evacuation with pericardiocentesis and drain was successful. Hemoglobin and kidney function was stable. 3. Hypertension. Slightly hypotensive after drain removal but stable without tachycardia or dyspnea.   For questions or updates, please contact [place] Please consult [email or web address] for contact info under Cardiology/STEMI.      Signed, Sinclair Grooms, MD  05/15/2017, 12:58 PM

## 2017-05-15 NOTE — Progress Notes (Signed)
  Echocardiogram 2D Echocardiogram limited has been performed.  Joseph Mercado 05/15/2017, 10:45 AM

## 2017-05-15 NOTE — Op Note (Signed)
CARDIOVASCULAR SURGERY OPERATIVE NOTE  05/15/2017  Surgeon:  Gaye Pollack, MD  First Assistant: Dineen Kid, RNFA   Preoperative Diagnosis:  Hemopericardium s/p CTO perforation  Postoperative Diagnosis:  Same  Procedure:  Subxyphoid pericardial window, drainage of hemopericardium   Anesthesia:  General Endotracheal   Clinical History/Surgical Indication:  The patient is a 71 year old gentleman with hypertension, hypercholesterolemia and CAD who has had a several month history of chest pain and dyspnea with exertion. His cath showed a CTO of the LCX with a moderate sized OM filling by collaterals. A gated nuclear stress showed inferior and inferolateral thinning with no ischemia and an EF of 61%. He underwent an attempt at crossing the CTO back in July at the time of his cath that was unsuccessful. He had a high radiation dose and therefore a decision was made to try crossing the lesion again several weeks later. He had repeat cath yesterday by Dr. Martinique and the CTO PCI was unsuccessful with coronary perforation and development of a large pericardial effusion that was treated with a pigtail drain. The drain did not put out much overnight and a repeat echo this am showed no significant effusion so the drain was removed. The patient then developed hypotension requiring levophed. A repeat echo now shows reaccumulation of some pericardial effusion but not as much as yesterday. There is epicardial hematoma present. He has reaccumulation of hemopericardium after CTO perforation yesterday and removal of the drain this am. He is hypotensive and requiring levophed. I think the safest option is to perform subxyphoid pericardial window to allow complete drainage of the effusion and if there is active bleeding he may require a sternotomy for repair of the bleeding site. I discussed the operative procedure with the patient and family including alternatives, benefits and risks; including but not limited  to bleeding, blood transfusion, infection, myocardial infarction, organ dysfunction, and death.  Georgina Peer understands and agrees to proceed.      Preparation:  The patient was seen in the preoperative holding area and the correct patient, correct operation were confirmed with the patient after reviewing the medical record and catheterization. The consent was signed by me. Preoperative antibiotics were given.  The patient was taken back to the operating room and positioned supine on the operating room table. After being placed under general endotracheal anesthesia by the anesthesia team a foley catheter was placed. The neck, chest, and abdomen were prepped with betadine soap and solution and draped in the usual sterile manner. A surgical time-out was taken and the correct patient and operative procedure were confirmed with the nursing and anesthesia staff.   TEE:  Performed by Dr. Nyoka Cowden. This showed a small circumferential pericardial effusion.  Subxyphoid pericardial window:  A 4 cm incision was made over the xyphoid process and carried down through the subcutaneous tissue using cautery until the midline fascia was encountered. The fascia was divided in the midline and the subxyphoid space entered. The anterior pericardium was visualized just at the diaphragm and it was opened with cautery. The pericardial space was entered and about 100 cc of  bloody fluid was removed. TEE confirmed that all of the fluid was removed. The BP did rise with drainage of the effusion and the levophed was weaned off.There did not appear to be active bleeding. A 36 F right angle chest tube was placed in the inferior pericardial space through a separate small incision. Hemostasis was complete. The midline fascia was approximated with interrupted #0 vicryl sutures.  The subcutaneous tissue was approximated with continuous 2-0 vicryl suture and the skin with 3-0 vicryl subcuticular suture. The sponge, needle, and instrument  counts were correct according to the nurses. The incision was covered with a dry sterile dressing. The patient was awakened, extubated, and transported to the ICU in stable condition.

## 2017-05-16 ENCOUNTER — Encounter (HOSPITAL_COMMUNITY): Payer: Self-pay | Admitting: Surgery

## 2017-05-16 DIAGNOSIS — I313 Pericardial effusion (noninflammatory): Secondary | ICD-10-CM

## 2017-05-16 DIAGNOSIS — E785 Hyperlipidemia, unspecified: Secondary | ICD-10-CM

## 2017-05-16 LAB — BASIC METABOLIC PANEL
Anion gap: 6 (ref 5–15)
BUN: 8 mg/dL (ref 6–20)
CALCIUM: 8.4 mg/dL — AB (ref 8.9–10.3)
CO2: 24 mmol/L (ref 22–32)
CREATININE: 0.89 mg/dL (ref 0.61–1.24)
Chloride: 102 mmol/L (ref 101–111)
GFR calc non Af Amer: >60 mL/min (ref >60)
Glucose, Bld: 155 mg/dL — ABNORMAL HIGH (ref 65–99)
Potassium: 4.6 mmol/L (ref 3.5–5.1)
SODIUM: 132 mmol/L — AB (ref 135–145)

## 2017-05-16 LAB — CBC
HCT: 27.9 % — ABNORMAL LOW (ref 39.0–52.0)
HEMOGLOBIN: 9.4 g/dL — AB (ref 13.0–17.0)
MCH: 30.3 pg (ref 26.0–34.0)
MCHC: 33.7 g/dL (ref 30.0–36.0)
MCV: 90 fL (ref 78.0–100.0)
Platelets: 195 10*3/uL (ref 150–400)
RBC: 3.1 MIL/uL — ABNORMAL LOW (ref 4.22–5.81)
RDW: 12.8 % (ref 11.5–15.5)
WBC: 14 10*3/uL — ABNORMAL HIGH (ref 4.0–10.5)

## 2017-05-16 LAB — PH, BODY FLUID: PH, BODY FLUID: 7.2

## 2017-05-16 MED ORDER — ISOSORBIDE MONONITRATE ER 30 MG PO TB24
30.0000 mg | ORAL_TABLET | Freq: Every day | ORAL | Status: DC
  Administered 2017-05-16 – 2017-05-18 (×3): 30 mg via ORAL
  Filled 2017-05-16 (×3): qty 1

## 2017-05-16 MED ORDER — METOPROLOL TARTRATE 25 MG PO TABS
25.0000 mg | ORAL_TABLET | Freq: Two times a day (BID) | ORAL | Status: DC
  Administered 2017-05-16 – 2017-05-18 (×5): 25 mg via ORAL
  Filled 2017-05-16 (×5): qty 1

## 2017-05-16 MED ORDER — CHLORHEXIDINE GLUCONATE CLOTH 2 % EX PADS: 6.0000 | MEDICATED_PAD | Freq: Every day | CUTANEOUS | Status: DC

## 2017-05-16 NOTE — Progress Notes (Signed)
1 Day Post-Op Procedure(s) (LRB): SUBXYPHOID PERICARDIAL WINDOW WITH DRAINAGE OF PERICARDIAL EFFUSION (N/A) Subjective: No complaints. Pain under control  Objective: Vital signs in last 24 hours: Temp:  [97.7 F (36.5 C)-98.7 F (37.1 C)] 97.9 F (36.6 C) (09/28 0400) Pulse Rate:  [55-124] 100 (09/28 0700) Cardiac Rhythm: Sinus tachycardia (09/28 0400) Resp:  [12-26] 13 (09/28 0700) BP: (56-136)/(38-82) 111/69 (09/28 0700) SpO2:  [91 %-100 %] 95 % (09/28 0700) Arterial Line BP: (103-223)/(36-68) 152/49 (09/28 0700)  Hemodynamic parameters for last 24 hours:    Intake/Output from previous day: 09/27 0701 - 09/28 0700 In: 4299.9 [P.O.:720; I.V.:2379.9; IV Piggyback:1200] Out: 2855 [Urine:2655; Blood:50; Chest Tube:150] Intake/Output this shift: No intake/output data recorded.  General appearance: alert and cooperative Heart: regular rate and rhythm, S1, S2 normal, no murmur, click, rub or gallop Lungs: clear to auscultation bilaterally Wound: dressing dry chest tube output minimal, serous  Lab Results:  Recent Labs  05/15/17 0529 05/16/17 0349  WBC 11.9* 14.0*  HGB 14.4 9.4*  HCT 41.9 27.9*  PLT 190 195   BMET:  Recent Labs  05/15/17 0529 05/16/17 0349  NA 132* 132*  K 4.7 4.6  CL 99* 102  CO2 25 24  GLUCOSE 94 155*  BUN 7 8  CREATININE 0.85 0.89  CALCIUM 8.7* 8.4*    PT/INR: No results for input(s): LABPROT, INR in the last 72 hours. ABG No results found for: PHART, HCO3, TCO2, ACIDBASEDEF, O2SAT CBG (last 3)  No results for input(s): GLUCAP in the last 72 hours.  Assessment/Plan: S/P Procedure(s) (LRB): SUBXYPHOID PERICARDIAL WINDOW WITH DRAINAGE OF PERICARDIAL EFFUSION (N/A)  POD 1 Hemodynamically stable in sinus rhythm DC arterial line and foley Keep pericardial tube in today but will be able to remove in am if continues to have minimal drainage. OOB, ambulate DC IVF and resume diet.  Anticipate home Sunday if tube comes out tomorrow.   LOS: 2 days    Gaye Pollack 05/16/2017

## 2017-05-16 NOTE — Progress Notes (Signed)
Dr. Linard Millers notified of pt complaining of substernal chest pain 9-10/10 "like when I had my heart attack", heart rate 110s, blood pressure elevated.  Orders received.  Will continue to monitor pt closely.

## 2017-05-16 NOTE — Progress Notes (Addendum)
Progress Note  Patient Name: Joseph Mercado Date of Encounter: 05/16/2017  Primary Cardiologist: Dr Gwenlyn Found  Subjective   Chest pain this am "like an elephant" EKG without changes. The discomfort was mild compared to prior and relieved with Tylenol  Inpatient Medications    Scheduled Meds: . acetaminophen  1,000 mg Oral Q6H   Or  . acetaminophen (TYLENOL) oral liquid 160 mg/5 mL  1,000 mg Oral Q6H  . bisacodyl  10 mg Oral Daily  . ezetimibe  10 mg Oral Daily  . multivitamin  1 tablet Oral BID  . pantoprazole  40 mg Oral BID  . senna-docusate  1 tablet Oral QHS  . sodium chloride flush  3 mL Intravenous Q12H   Continuous Infusions: . sodium chloride Stopped (05/15/17 1729)  . potassium chloride     PRN Meds: sodium chloride, acetaminophen, morphine injection, ondansetron (ZOFRAN) IV, oxyCODONE, potassium chloride, sodium chloride flush   Vital Signs    Vitals:   05/16/17 0957 05/16/17 1000 05/16/17 1015 05/16/17 1030  BP:  (!) 142/73    Pulse: (!) 109 (!) 134 (!) 115 (!) 109  Resp: (!) 30 (!) 23 18 (!) 23  Temp:      TempSrc:      SpO2: 97% (!) 89% 97% 96%  Weight:      Height:        Intake/Output Summary (Last 24 hours) at 05/16/17 1209 Last data filed at 05/16/17 1000  Gross per 24 hour  Intake          3529.86 ml  Output             2425 ml  Net          1104.86 ml   Filed Weights   05/14/17 0624 05/14/17 2015  Weight: 225 lb (102.1 kg) 224 lb 13.9 oz (102 kg)    Telemetry    NSR, ST Personally Reviewed  ECG    NSR, ST - Personally Reviewed  Physical Exam   GEN: No acute distress.   Neck: No JVD Cardiac: RRR, no murmurs, rubs, or gallops.  Respiratory: Clear to auscultation bilaterally. GI: Soft, nontender, non-distended  MS: No edema; No deformity. Neuro:  Nonfocal  Psych: Normal affect   Labs    Chemistry Recent Labs Lab 05/14/17 1609 05/15/17 0529 05/16/17 0349  NA 131* 132* 132*  K 4.0 4.7 4.6  CL 101 99* 102  CO2 21* 25 24    GLUCOSE 139* 94 155*  BUN 6 7 8   CREATININE 0.98 0.85 0.89  CALCIUM 8.3* 8.7* 8.4*  GFRNONAA >60 >60 >60  GFRAA >60 >60 >60  ANIONGAP 9 8 6      Hematology Recent Labs Lab 05/14/17 1609 05/15/17 0529 05/16/17 0349  WBC 18.6* 11.9* 14.0*  RBC 4.69 4.63 3.10*  HGB 14.4 14.4 9.4*  HCT 41.7 41.9 27.9*  MCV 88.9 90.5 90.0  MCH 30.7 31.1 30.3  MCHC 34.5 34.4 33.7  RDW 12.8 13.0 12.8  PLT 208 190 195    Cardiac EnzymesNo results for input(s): TROPONINI in the last 168 hours. No results for input(s): TROPIPOC in the last 168 hours.   BNPNo results for input(s): BNP, PROBNP in the last 168 hours.   DDimer No results for input(s): DDIMER in the last 168 hours.   Radiology    Dg Chest Port 1 View  Result Date: 05/15/2017 CLINICAL DATA:  Sub xiphoid pericardial window for drainage of hemopericardium. EXAM: PORTABLE CHEST 1 VIEW COMPARISON:  03/10/2017  chest radiograph FINDINGS: Upper limits normal heart size again noted. This is a low volume film with mild bibasilar atelectasis. There is no evidence of pneumothorax. A right IJ central venous catheter sheath is present. No acute bony abnormalities are identified. IMPRESSION: Low volume film with mild bibasilar atelectasis. Electronically Signed   By: Margarette Canada M.D.   On: 05/15/2017 18:14    Cardiac Studies   Echo 05/15/17- Study Conclusions  - Left ventricle: The cavity size was normal. Wall thickness was   normal. Systolic function was vigorous. The estimated ejection   fraction was in the range of 65% to 70%. - Right ventricle: The cavity size was normal. Systolic function   was normal. - Pericardium, extracardiac: There was a moderate-sized pericardial   effusion that was partially organized with fibrinous material.   There was no RV diastolic collapse. Suspect no tamponade.  Impressions:  - Limited echo.  Patient Profile     71 y.o. male followed by Dr Gwenlyn Found with HTN, HLD and CAD who had a cath in July 2018 that  showed a CTO of the LCX with a moderate sized OM filling by collaterals. A nuclear stress showed inferior and inferolateral thinning with no ischemia and an EF of 61%. He underwent an attempt at crossing the CTO back in July but that was unsuccessful. He had a high radiation dose and therefore a decision was made to try crossing the lesion again several weeks later. The pt saw Dr Martinique as an OP 04/24/17 and was set up for OP cath and PCI. This was attempted by Dr. Martinique 9/26/18and was unsuccessful complicated by coronary perforation and development of a large pericardial effusion that was treated with a pigtail drain. When the drain was removed the next day her had reaccumulation of his pericardial effusion and went to the OR for pericardial window 05/15/17.   Assessment & Plan    CAD- known CFX CTO by cath July 2018 with an abnormal Myoview. Unsuccessful PCI in July and again 05/14/17  Pericardial effusion- secondary to coronary perforation with PCI 9/26 with recurrence 9/27 when his drain was pulled requiring pericardial window  HTN-normal LVF with grade 2 DD by echo in July 2018  HLD- H/O statin intolerance  Plan: Resume his beta blocker and add nitrate.   For questions or updates, please contact [place] Please consult [email or web address] for contact info under Cardiology/STEMI.      Signed, Kerin Ransom, PA-C  05/16/2017, 12:09 PM    The patient has been seen in conjunction with Kerin Ransom, PA-C. All aspects of care have been considered and discussed. The patient has been personally interviewed, examined, and all clinical data has been reviewed.   In good spirits this morning and afternoon. Had some mild discomfort this morning relative to previous discomfort during taponade. Discomfort this morning likely related to the indwelling chest tube being used for pericardial drain.  During pain, EKG did not demonstrate any significant ischemic changes.  Plan to remove drain when  appropriate.  Optimize medical therapy.  Assuming no other issues, perhaps home late this weekend or early next week.  Discuss when/if appropriate to resume dual antiplatelet therapy.

## 2017-05-17 NOTE — Progress Notes (Signed)
Progress Note  Patient Name: Joseph Mercado Date of Encounter: 05/17/2017  Primary Cardiologist: Dr Gwenlyn Found  Subjective   Short of breath and mild chest discomfort but otherwise feeling well.  Inpatient Medications    Scheduled Meds: . acetaminophen  1,000 mg Oral Q6H   Or  . acetaminophen (TYLENOL) oral liquid 160 mg/5 mL  1,000 mg Oral Q6H  . bisacodyl  10 mg Oral Daily  . ezetimibe  10 mg Oral Daily  . isosorbide mononitrate  30 mg Oral Daily  . metoprolol tartrate  25 mg Oral BID  . multivitamin  1 tablet Oral BID  . pantoprazole  40 mg Oral BID  . senna-docusate  1 tablet Oral QHS  . sodium chloride flush  3 mL Intravenous Q12H   Continuous Infusions: . sodium chloride Stopped (05/15/17 1729)  . potassium chloride     PRN Meds: sodium chloride, acetaminophen, morphine injection, ondansetron (ZOFRAN) IV, oxyCODONE, potassium chloride, sodium chloride flush   Vital Signs    Vitals:   05/17/17 0800 05/17/17 0900 05/17/17 1000 05/17/17 1100  BP: 109/60 (!) 142/77 120/83 114/68  Pulse: 95 (!) 118 (!) 115 99  Resp: 12 (!) 21 19 18   Temp:      TempSrc:      SpO2: 93% 95% 97% 100%  Weight:      Height:        Intake/Output Summary (Last 24 hours) at 05/17/17 1229 Last data filed at 05/17/17 0800  Gross per 24 hour  Intake             1300 ml  Output              985 ml  Net              315 ml   Filed Weights   05/14/17 0624 05/14/17 2015  Weight: 225 lb (102.1 kg) 224 lb 13.9 oz (102 kg)    Telemetry    NSR Personally Reviewed  ECG    Sinus rhythm, rate 110 - Personally Reviewed  Physical Exam   GEN: Well nourished, well developed, in no acute distress  HEENT: normal  Neck: no JVD, carotid bruits, or masses Cardiac: tachycardic, regular; no murmurs, rubs, or gallops,no edema  Respiratory:  clear to auscultation bilaterally, normal work of breathing GI: soft, nontender, nondistended, + BS MS: no deformity or atrophy  Skin: warm and dry Neuro:   Strength and sensation are intact Psych: euthymic mood, full affect   Labs    Chemistry  Recent Labs Lab 05/14/17 1609 05/15/17 0529 05/16/17 0349  NA 131* 132* 132*  K 4.0 4.7 4.6  CL 101 99* 102  CO2 21* 25 24  GLUCOSE 139* 94 155*  BUN 6 7 8   CREATININE 0.98 0.85 0.89  CALCIUM 8.3* 8.7* 8.4*  GFRNONAA >60 >60 >60  GFRAA >60 >60 >60  ANIONGAP 9 8 6      Hematology  Recent Labs Lab 05/14/17 1609 05/15/17 0529 05/16/17 0349  WBC 18.6* 11.9* 14.0*  RBC 4.69 4.63 3.10*  HGB 14.4 14.4 9.4*  HCT 41.7 41.9 27.9*  MCV 88.9 90.5 90.0  MCH 30.7 31.1 30.3  MCHC 34.5 34.4 33.7  RDW 12.8 13.0 12.8  PLT 208 190 195    Cardiac EnzymesNo results for input(s): TROPONINI in the last 168 hours. No results for input(s): TROPIPOC in the last 168 hours.   BNPNo results for input(s): BNP, PROBNP in the last 168 hours.   DDimer No results  for input(s): DDIMER in the last 168 hours.   Radiology    Dg Chest Port 1 View  Result Date: 05/15/2017 CLINICAL DATA:  Sub xiphoid pericardial window for drainage of hemopericardium. EXAM: PORTABLE CHEST 1 VIEW COMPARISON:  03/10/2017 chest radiograph FINDINGS: Upper limits normal heart size again noted. This is a low volume film with mild bibasilar atelectasis. There is no evidence of pneumothorax. A right IJ central venous catheter sheath is present. No acute bony abnormalities are identified. IMPRESSION: Low volume film with mild bibasilar atelectasis. Electronically Signed   By: Margarette Canada M.D.   On: 05/15/2017 18:14    Cardiac Studies   Echo 05/15/17- Study Conclusions  - Left ventricle: The cavity size was normal. Wall thickness was   normal. Systolic function was vigorous. The estimated ejection   fraction was in the range of 65% to 70%. - Right ventricle: The cavity size was normal. Systolic function   was normal. - Pericardium, extracardiac: There was a moderate-sized pericardial   effusion that was partially organized with  fibrinous material.   There was no RV diastolic collapse. Suspect no tamponade.  Impressions:  - Limited echo.  Patient Profile     71 y.o. male followed by Dr Gwenlyn Found with HTN, HLD and CAD who had a cath in July 2018 that showed a CTO of the LCX with a moderate sized OM filling by collaterals. A nuclear stress showed inferior and inferolateral thinning with no ischemia and an EF of 61%. He underwent an attempt at crossing the CTO back in July but that was unsuccessful. He had a high radiation dose and therefore a decision was made to try crossing the lesion again several weeks later. The pt saw Dr Martinique as an OP 04/24/17 and was set up for OP cath and PCI. This was attempted by Dr. Martinique 9/26/18and was unsuccessful complicated by coronary perforation and development of a large pericardial effusion that was treated with a pigtail drain. When the drain was removed the next day her had reaccumulation of his pericardial effusion and went to the OR for pericardial window 05/15/17.   Assessment & Plan    CAD- Unsuccessful CTO intervention in July and again this admission. continue current medications for CAD.  Pericardial effusion- secondary to coronary perf from attempted CTO intervention. S/p pericardial window. Plan to remove drain today. [name] transfer to stepdown after drain removal. Appreciate surgery input on restarting DAPT.  HTN- BP well controlled  HLD: History of statin intolerance  Plan: continue current management. No changes.  For questions or updates, please contact [place] Please consult [email or web address] for contact info under Cardiology/STEMI.      Signed, Mays Paino Meredith Leeds, MD  05/17/2017, 12:29 PM

## 2017-05-17 NOTE — Progress Notes (Signed)
2 Days Post-Op Procedure(s) (LRB): SUBXYPHOID PERICARDIAL WINDOW WITH DRAINAGE OF PERICARDIAL EFFUSION (N/A) Subjective: No complaints  Objective: Vital signs in last 24 hours: Temp:  [98.2 F (36.8 C)-99.6 F (37.6 C)] 98.3 F (36.8 C) (09/29 0750) Pulse Rate:  [82-134] 96 (09/29 0700) Cardiac Rhythm: Normal sinus rhythm (09/29 0400) Resp:  [13-30] 13 (09/29 0700) BP: (74-142)/(42-80) 109/58 (09/29 0700) SpO2:  [89 %-100 %] 90 % (09/29 0700) Arterial Line BP: (150-189)/(45-59) 150/45 (09/28 1100)  Hemodynamic parameters for last 24 hours:    Intake/Output from previous day: 09/28 0701 - 09/29 0700 In: 1482.5 [P.O.:1220; I.V.:262.5] Out: 1955 [Urine:1625; Chest Tube:330] Intake/Output this shift: No intake/output data recorded.  General appearance: alert and cooperative Wound: incision ok chest tube output low, serosanguinous  Lab Results:  Recent Labs  05/15/17 0529 05/16/17 0349  WBC 11.9* 14.0*  HGB 14.4 9.4*  HCT 41.9 27.9*  PLT 190 195   BMET:  Recent Labs  05/15/17 0529 05/16/17 0349  NA 132* 132*  K 4.7 4.6  CL 99* 102  CO2 25 24  GLUCOSE 94 155*  BUN 7 8  CREATININE 0.85 0.89  CALCIUM 8.7* 8.4*    PT/INR: No results for input(s): LABPROT, INR in the last 72 hours. ABG No results found for: PHART, HCO3, TCO2, ACIDBASEDEF, O2SAT CBG (last 3)  No results for input(s): GLUCAP in the last 72 hours.  Assessment/Plan: S/P Procedure(s) (LRB): SUBXYPHOID PERICARDIAL WINDOW WITH DRAINAGE OF PERICARDIAL EFFUSION (N/A)  POD 2   Minimal chest tube output so will remove tube. He can go home tomorrow if no changes and I will see him in the office in 10 days for wound check and to remove chest tube suture. I will have our PA set that up.   LOS: 3 days    Gaye Pollack 05/17/2017

## 2017-05-17 NOTE — Progress Notes (Signed)
Tech offered Pt a bath. Pt stated "not right now, maybe after the chest tube is removed." Tech will recheck with Pt regarding bath.

## 2017-05-17 NOTE — Plan of Care (Signed)
Problem: Activity: Goal: Ability to tolerate increased activity will improve Outcome: Progressing OOB to chair; ambulate in room  Problem: Bowel/Gastric: Goal: Gastrointestinal status for postoperative course will improve Outcome: Progressing Passing Gas  Problem: Pain Management: Goal: Pain level will decrease Outcome: Progressing Denies pain on PO OTC Meds

## 2017-05-17 NOTE — Plan of Care (Signed)
Problem: Activity: Goal: Ability to tolerate increased activity will improve Outcome: Progressing OOB to chair

## 2017-05-18 ENCOUNTER — Other Ambulatory Visit: Payer: Self-pay | Admitting: Physician Assistant

## 2017-05-18 DIAGNOSIS — I3139 Other pericardial effusion (noninflammatory): Secondary | ICD-10-CM

## 2017-05-18 DIAGNOSIS — I313 Pericardial effusion (noninflammatory): Secondary | ICD-10-CM

## 2017-05-18 LAB — CBC
HEMATOCRIT: 25.3 % — AB (ref 39.0–52.0)
Hemoglobin: 8.2 g/dL — ABNORMAL LOW (ref 13.0–17.0)
MCH: 29.7 pg (ref 26.0–34.0)
MCHC: 32.4 g/dL (ref 30.0–36.0)
MCV: 91.7 fL (ref 78.0–100.0)
PLATELETS: 225 10*3/uL (ref 150–400)
RBC: 2.76 MIL/uL — ABNORMAL LOW (ref 4.22–5.81)
RDW: 13.2 % (ref 11.5–15.5)
WBC: 10 10*3/uL (ref 4.0–10.5)

## 2017-05-18 LAB — BODY FLUID CULTURE: CULTURE: NO GROWTH

## 2017-05-18 MED ORDER — ISOSORBIDE MONONITRATE ER 30 MG PO TB24: 30.0000 mg | ORAL_TABLET | Freq: Every day | ORAL | 4 refills | Status: DC

## 2017-05-18 MED ORDER — CLOPIDOGREL BISULFATE 75 MG PO TABS: 75.0000 mg | ORAL_TABLET | Freq: Every day | ORAL | 3 refills | Status: DC

## 2017-05-18 MED ORDER — ASPIRIN 81 MG PO TBEC
81.0000 mg | DELAYED_RELEASE_TABLET | Freq: Every day | ORAL | 12 refills | Status: DC
Start: 2017-05-18 — End: 2020-05-16

## 2017-05-18 MED ORDER — PHENOL 1.4 % MT LIQD
1.0000 | OROMUCOSAL | Status: DC | PRN
  Filled 2017-05-18: qty 177

## 2017-05-18 NOTE — Progress Notes (Addendum)
Progress Note  Patient Name: Joseph Mercado Date of Encounter: 05/18/2017  Primary Cardiologist: Dr Gwenlyn Found  Subjective   Pericardial drain pulled yesterday. Feeling well without chest pain, SOB.  Inpatient Medications    Scheduled Meds: . acetaminophen  1,000 mg Oral Q6H   Or  . acetaminophen (TYLENOL) oral liquid 160 mg/5 mL  1,000 mg Oral Q6H  . bisacodyl  10 mg Oral Daily  . ezetimibe  10 mg Oral Daily  . isosorbide mononitrate  30 mg Oral Daily  . metoprolol tartrate  25 mg Oral BID  . multivitamin  1 tablet Oral BID  . pantoprazole  40 mg Oral BID  . senna-docusate  1 tablet Oral QHS  . sodium chloride flush  3 mL Intravenous Q12H   Continuous Infusions: . sodium chloride Stopped (05/15/17 1729)  . potassium chloride     PRN Meds: sodium chloride, acetaminophen, morphine injection, ondansetron (ZOFRAN) IV, oxyCODONE, phenol, potassium chloride, sodium chloride flush   Vital Signs    Vitals:   05/18/17 0530 05/18/17 0600 05/18/17 0630 05/18/17 0700  BP: 111/60 (!) 144/68 136/65   Pulse: 87 94 100   Resp: 17 18 (!) 21   Temp:    97.8 F (36.6 C)  TempSrc:    Oral  SpO2: 100% 100% 100%   Weight:      Height:        Intake/Output Summary (Last 24 hours) at 05/18/17 0906 Last data filed at 05/18/17 7681  Gross per 24 hour  Intake              600 ml  Output             2050 ml  Net            -1450 ml   Filed Weights   05/14/17 0624 05/14/17 2015  Weight: 225 lb (102.1 kg) 224 lb 13.9 oz (102 kg)    Telemetry    NSR Personally Reviewed  ECG    None new - Personally Reviewed  Physical Exam   GEN: Well nourished, well developed, in no acute distress  HEENT: normal  Neck: no JVD, carotid bruits, or masses Cardiac: RRR; no murmurs, rubs, or gallops,no edema  Respiratory:  clear to auscultation bilaterally, normal work of breathing GI: soft, nontender, nondistended, + BS MS: no deformity or atrophy  Skin: warm and dry Neuro:  Strength and  sensation are intact Psych: euthymic mood, full affect    Labs    Chemistry  Recent Labs Lab 05/14/17 1609 05/15/17 0529 05/16/17 0349  NA 131* 132* 132*  K 4.0 4.7 4.6  CL 101 99* 102  CO2 21* 25 24  GLUCOSE 139* 94 155*  BUN 6 7 8   CREATININE 0.98 0.85 0.89  CALCIUM 8.3* 8.7* 8.4*  GFRNONAA >60 >60 >60  GFRAA >60 >60 >60  ANIONGAP 9 8 6      Hematology  Recent Labs Lab 05/14/17 1609 05/15/17 0529 05/16/17 0349  WBC 18.6* 11.9* 14.0*  RBC 4.69 4.63 3.10*  HGB 14.4 14.4 9.4*  HCT 41.7 41.9 27.9*  MCV 88.9 90.5 90.0  MCH 30.7 31.1 30.3  MCHC 34.5 34.4 33.7  RDW 12.8 13.0 12.8  PLT 208 190 195    Cardiac EnzymesNo results for input(s): TROPONINI in the last 168 hours. No results for input(s): TROPIPOC in the last 168 hours.   BNPNo results for input(s): BNP, PROBNP in the last 168 hours.   DDimer No results for input(s): DDIMER  in the last 168 hours.   Radiology    No results found.  Cardiac Studies   Echo 05/15/17- Study Conclusions  - Left ventricle: The cavity size was normal. Wall thickness was   normal. Systolic function was vigorous. The estimated ejection   fraction was in the range of 65% to 70%. - Right ventricle: The cavity size was normal. Systolic function   was normal. - Pericardium, extracardiac: There was a moderate-sized pericardial   effusion that was partially organized with fibrinous material.   There was no RV diastolic collapse. Suspect no tamponade.  Impressions:  - Limited echo.  Patient Profile     71 y.o. male followed by Dr Gwenlyn Found with HTN, HLD and CAD who had a cath in July 2018 that showed a CTO of the LCX with a moderate sized OM filling by collaterals. A nuclear stress showed inferior and inferolateral thinning with no ischemia and an EF of 61%. He underwent an attempt at crossing the CTO back in July but that was unsuccessful. He had a high radiation dose and therefore a decision was made to try crossing the lesion  again several weeks later. The pt saw Dr Martinique as an OP 04/24/17 and was set up for OP cath and PCI. This was attempted by Dr. Martinique 9/26/18and was unsuccessful complicated by coronary perforation and development of a large pericardial effusion that was treated with a pigtail drain. When the drain was removed the next day her had reaccumulation of his pericardial effusion and went to the OR for pericardial window 05/15/17.   Assessment & Plan    CAD- Unsuccessful CTO intervention complicated by pericardial effusion. [name] need home antiplatelets started Tuesday.  Pericardial effusion with accidental perforation of coronary artery during CTO procedure- Secondary to accidental perforation of coronary artery during procedure/complication due to CTO intervention. Pericardial drain pulled. Check CBC today to ensure stability. If so, [name] plan for discharge.  HTN- well controlled  HLD: History of statin intolerance  Plan: discharge today if CBC stable.  For questions or updates, please contact [facility] Please consult [email or web address] for contact info under Cardiology/STEMI.      Signed, [name], MD  05/18/2017, 9:06 AM

## 2017-05-18 NOTE — Discharge Summary (Signed)
Discharge Summary    Patient ID: Joseph Mercado,  MRN: [id number], DOB/AGE: [birth year] 71 y.o.  Admit date: 05/14/2017 Discharge date: 05/18/2017   Primary Care Provider: Abigail Miyamoto Primary Cardiologist: Dr. Allyson Sabal  Discharge Diagnoses    Principal Problem:   Angina pectoris [facility]) Active Problems:   Essential hypertension   CAD (coronary artery disease)   Hyperlipidemia LDL goal <70   Pericardial tamponade   Cardiac/pericardial tamponade   Pericardial effusion   Allergies Allergies  Allergen Reactions  . Statins Other (See Comments)    Patient experienced myalgias after trying one statin several years ago. Not interested in trying another statin.      History of Present Illness     Joseph Mercado is a 71 y.o. male who is seen at the request of Dr. Allyson Sabal for consideration of CTO PCI. He has a history of HTN and family history of CAD. Apparently had a stress test 2 years ago that was OK. Seen in June by Dr. Allyson Sabal with a several month history of progressive DOE and chest pain on exertion. Echo showed mild LVH with grade 2 diastolic dysfunction. Valves were OK. Myoview study showed inferolateral ischemia. This led to a cardiac cath on 03/10/17 which showed CTO of the LCx with collaterals to a fairly large OM. PCI was attempted but unable to cross completely into the large OM so unable to recanalize the vessel. It was noted that this was a long and difficult procedure with radiation dose of 4 gray.    On follow up in clinic on 04/24/17 he states he is doing OK. He still has some chest pain from time to time. No skin toxicity from prior cath. No bleeding. History of intolerance to statins.  Patient presented for CTO procedure on 05/14/17.   Hospital Course     Consultants: TCTS  Pt was admitted to cardiology for CTO procedure on 05/14/17. This was unsuccessful and complicated by coronary perforation and development of a large pericardial effusion that was treated with a  pigtail drain. When the drain was removed the next day he had reaccumulation of his pericardial effusion and went to the OR for pericardial window 05/15/17.  TCTS performed subxyphoid pericardial window, drainage of hemopericardium on 05/15/17. 100 cc fluid drained with subsequent increase in pressure. Drain was removed on 05/17/17 and his pressures maintained in the 120s. On 05/18/17, he was stable. Repeat CBC today with Hb 8.2 (9.4) on 05/18/17 and he was HDS. Bedside echocardiogram today negative for pericardial effusion.   He was instructed to restart antiplatelet therapy on Tues May 20, 2017. He was discharged on his home lopressor. Lisinopril was D/C'ed and imdur added. He was instructed to report to lab for a repeat CBC on 05/21/17.  Patient seen and examined by Dr. Elberta Fortis today and was stable for discharge. All follow up has been arranged.  _____________  Discharge Vitals Blood pressure 123/71, pulse 83, temperature 97.8 F (36.6 C), temperature source Oral, resp. rate 11, height 5\' 11"  (1.803 m), weight 224 lb 13.9 oz (102 kg), SpO2 98 %.  Filed Weights   05/14/17 0624 05/14/17 2015  Weight: 225 lb (102.1 kg) 224 lb 13.9 oz (102 kg)    Labs & Radiologic Studies    CBC  Recent Labs  05/16/17 0349 05/18/17 0916  WBC 14.0* 10.0  HGB 9.4* 8.2*  HCT 27.9* 25.3*  MCV 90.0 91.7  PLT 195 225   Basic Metabolic Panel  Recent Labs  05/16/17 0349  NA 132*  K 4.6  CL 102  CO2 24  GLUCOSE 155*  BUN 8  CREATININE 0.89  CALCIUM 8.4*   Liver Function Tests No results for input(s): AST, ALT, ALKPHOS, BILITOT, PROT, ALBUMIN in the last 72 hours. No results for input(s): LIPASE, AMYLASE in the last 72 hours. Cardiac Enzymes No results for input(s): CKTOTAL, CKMB, CKMBINDEX, TROPONINI in the last 72 hours. BNP Invalid input(s): POCBNP D-Dimer No results for input(s): DDIMER in the last 72 hours. Hemoglobin A1C No results for input(s): HGBA1C in the last 72 hours. Fasting Lipid  Panel No results for input(s): CHOL, HDL, LDLCALC, TRIG, CHOLHDL, LDLDIRECT in the last 72 hours. Thyroid Function Tests No results for input(s): TSH, T4TOTAL, T3FREE, THYROIDAB in the last 72 hours.  Invalid input(s): FREET3 _____________  Dg Chest Port 1 View  Result Date: 05/15/2017 CLINICAL DATA:  Sub xiphoid pericardial window for drainage of hemopericardium. EXAM: PORTABLE CHEST 1 VIEW COMPARISON:  03/10/2017 chest radiograph FINDINGS: Upper limits normal heart size again noted. This is a low volume film with mild bibasilar atelectasis. There is no evidence of pneumothorax. A right IJ central venous catheter sheath is present. No acute bony abnormalities are identified. IMPRESSION: Low volume film with mild bibasilar atelectasis. Electronically Signed   By: Harmon Pier M.D.   On: 05/15/2017 18:14     Diagnostic Studies/Procedures    Echocardiogram 05/15/17: Study Conclusions - Left ventricle: The cavity size was normal. Wall thickness was   normal. Systolic function was vigorous. The estimated ejection   fraction was in the range of 65% to 70%. - Right ventricle: The cavity size was normal. Systolic function   was normal. - Pericardium, extracardiac: There was a moderate-sized pericardial   effusion that was partially organized with fibrinous material.   There was no RV diastolic collapse. Suspect no tamponade.  Echocardiogram 05/14/17: Study Conclusions - Impressions: Stat echo done with limited images   Appears to be an acute hemo pericardial effusion with tamponade   LV is hyperdynamic and under filled RV is small there is a small   to moderate circumferential pericardial effusion with density at   the apex and RV lateral wall. IVC is also dilated Findings discussed with Dr Katrinka Blazing.  Left heart cath 03/10/17:  Mid LAD lesion, 45 %stenosed.  Mid Cx lesion, 95 %stenosed.  Prox Cx to Mid Cx lesion, 100 %stenosed.  Post intervention, there is a 95% residual stenosis.  The  left ventricular systolic function is normal.  LV end diastolic pressure is normal.  The left ventricular ejection fraction is 55-65% by visual estimate.  IMPRESSION: Unsuccessful attempt at crossing an occluded mid nondominant circumflex into the AV groove continuation of the posterior lateral branch. Patient does have normal LV function. He was pain-free at the end of the case. He was loaded with aspirin and Brilinta. We Clairissa Valvano restart heparin without a bolus 4 hours after sheath removal. The plan Khamarion Bjelland be to reattempt circumflex re-intervention tomorrow. The Air Kerma  was 4014 mgr and the fluoroscopytime was 39.6 minutes. Total contrast administered and patient was 190 mL. The ACT was 560.  Disposition   Pt is being discharged home today in good condition.  Follow-up Plans & Appointments    Follow-up Information    Alleen Borne, MD Follow up in 2 week(s).   Specialty:  Cardiothoracic Surgery Why:  Office Min Colly[place] contact you with appointment date and time Contact information: [address] [employer] [address] [place] [address] [phone]  Discharge Instructions    AMB Referral to Cardiac Rehabilitation - Phase II    Complete by:  As directed    Diagnosis:  NSTEMI   Diet - low sodium heart healthy    Complete by:  As directed    Discharge instructions    Complete by:  As directed    No driving for 48 hr. No lifting over 5 lbs for 1 week. No sexual activity for 1 week. Keep procedure site clean & dry. If you notice increased pain, swelling, bleeding or pus, call/return!  You may shower, but no soaking baths/hot tubs/pools for 1 week.   Increase activity slowly    Complete by:  As directed       Discharge Medications   Current Discharge Medication List    START taking these medications   Details  isosorbide mononitrate (IMDUR) 30 MG 24 hr tablet Take 1 tablet (30 mg total) by mouth daily. Qty: 90 tablet, Refills: 4      CONTINUE these medications which have  CHANGED   Details  aspirin 81 MG EC tablet Take 1 tablet (81 mg total) by mouth daily. Resume on 05/20/17. Qty: 30 tablet, Refills: 12    clopidogrel (PLAVIX) 75 MG tablet Take 1 tablet (75 mg total) by mouth daily. Resume on 05/20/17. Qty: 90 tablet, Refills: 3      CONTINUE these medications which have NOT CHANGED   Details  ezetimibe (ZETIA) 10 MG tablet Take 1 tablet (10 mg total) by mouth daily. Qty: 30 tablet, Refills: 6    metoprolol tartrate (LOPRESSOR) 50 MG tablet Take 25 mg by mouth 2 (two) times daily.     Multiple Vitamins-Minerals (PRESERVISION AREDS PO) Take 1 capsule by mouth 2 (two) times daily.    nitroGLYCERIN (NITROSTAT) 0.4 MG SL tablet Place 0.4 mg under the tongue every 5 (five) minutes as needed for chest pain.     pantoprazole (PROTONIX) 40 MG tablet Take 40 mg by mouth 2 (two) times daily.       STOP taking these medications     lisinopril (PRINIVIL,ZESTRIL) 20 MG tablet      sildenafil (REVATIO) 20 MG tablet           Outstanding Labs/Studies   CBC on 05/21/17.   Office Sirron Francesconi call with follow up TCM appt.  Duration of Discharge Encounter   Greater than 30 minutes including physician time.  Signed, Roe Rutherford Duke PA-C 05/18/2017, 10:56 AM   I have seen and examined this patient with Micah Flesher.  Agree with above, note added to reflect my findings.  On exam, RRR, no murmurs, lungs clear. Patient had attempt at CTO intervention complicated by pericardial effusion. Had drain placed with re-accumulation and pericardial window. Drain removed yesterday. CBC down but no evidence of effusion on bedside echo. Plan for discharge today.    Elliona Doddridge M. Guilianna Mckoy MD 05/18/2017 11:32 AM

## 2017-05-19 DIAGNOSIS — R079 Chest pain, unspecified: Secondary | ICD-10-CM | POA: Diagnosis not present

## 2017-05-19 LAB — TYPE AND SCREEN
ABO/RH(D): O NEG
Antibody Screen: NEGATIVE
Unit division: 0
Unit division: 0

## 2017-05-19 LAB — BPAM RBC
BLOOD PRODUCT EXPIRATION DATE: 201810192359
Blood Product Expiration Date: 201810192359
ISSUE DATE / TIME: 201809290434
Unit Type and Rh: 9500
Unit Type and Rh: 9500

## 2017-05-20 ENCOUNTER — Inpatient Hospital Stay (HOSPITAL_COMMUNITY)
Admission: EM | Admit: 2017-05-20 | Discharge: 2017-05-21 | DRG: 281 | Disposition: A | Discharge status: Home / Self Care | Payer: Medicare Other | Attending: Cardiology | Admitting: Cardiology

## 2017-05-20 ENCOUNTER — Encounter (HOSPITAL_COMMUNITY): Payer: Self-pay | Admitting: Emergency Medicine

## 2017-05-20 ENCOUNTER — Emergency Department (HOSPITAL_COMMUNITY): Payer: Medicare Other

## 2017-05-20 DIAGNOSIS — I340 Nonrheumatic mitral (valve) insufficiency: Secondary | ICD-10-CM | POA: Diagnosis not present

## 2017-05-20 DIAGNOSIS — Z7982 Long term (current) use of aspirin: Secondary | ICD-10-CM

## 2017-05-20 DIAGNOSIS — Z9842 Cataract extraction status, left eye: Secondary | ICD-10-CM | POA: Diagnosis not present

## 2017-05-20 DIAGNOSIS — E785 Hyperlipidemia, unspecified: Secondary | ICD-10-CM | POA: Diagnosis present

## 2017-05-20 DIAGNOSIS — Z961 Presence of intraocular lens: Secondary | ICD-10-CM | POA: Diagnosis present

## 2017-05-20 DIAGNOSIS — Z79899 Other long term (current) drug therapy: Secondary | ICD-10-CM

## 2017-05-20 DIAGNOSIS — I25119 Atherosclerotic heart disease of native coronary artery with unspecified angina pectoris: Secondary | ICD-10-CM | POA: Diagnosis not present

## 2017-05-20 DIAGNOSIS — H3530 Unspecified macular degeneration: Secondary | ICD-10-CM | POA: Diagnosis present

## 2017-05-20 DIAGNOSIS — I110 Hypertensive heart disease with heart failure: Secondary | ICD-10-CM | POA: Diagnosis present

## 2017-05-20 DIAGNOSIS — Z8249 Family history of ischemic heart disease and other diseases of the circulatory system: Secondary | ICD-10-CM

## 2017-05-20 DIAGNOSIS — E782 Mixed hyperlipidemia: Secondary | ICD-10-CM | POA: Diagnosis present

## 2017-05-20 DIAGNOSIS — I313 Pericardial effusion (noninflammatory): Secondary | ICD-10-CM

## 2017-05-20 DIAGNOSIS — I10 Essential (primary) hypertension: Secondary | ICD-10-CM | POA: Diagnosis present

## 2017-05-20 DIAGNOSIS — R079 Chest pain, unspecified: Secondary | ICD-10-CM | POA: Diagnosis not present

## 2017-05-20 DIAGNOSIS — Z87891 Personal history of nicotine dependence: Secondary | ICD-10-CM

## 2017-05-20 DIAGNOSIS — I209 Angina pectoris, unspecified: Secondary | ICD-10-CM | POA: Diagnosis present

## 2017-05-20 DIAGNOSIS — K219 Gastro-esophageal reflux disease without esophagitis: Secondary | ICD-10-CM | POA: Diagnosis present

## 2017-05-20 DIAGNOSIS — I214 Non-ST elevation (NSTEMI) myocardial infarction: Principal | ICD-10-CM | POA: Diagnosis present

## 2017-05-20 DIAGNOSIS — Z9841 Cataract extraction status, right eye: Secondary | ICD-10-CM

## 2017-05-20 DIAGNOSIS — R072 Precordial pain: Secondary | ICD-10-CM | POA: Insufficient documentation

## 2017-05-20 DIAGNOSIS — D649 Anemia, unspecified: Secondary | ICD-10-CM

## 2017-05-20 DIAGNOSIS — I5030 Unspecified diastolic (congestive) heart failure: Secondary | ICD-10-CM | POA: Diagnosis present

## 2017-05-20 DIAGNOSIS — Z888 Allergy status to other drugs, medicaments and biological substances status: Secondary | ICD-10-CM | POA: Diagnosis not present

## 2017-05-20 DIAGNOSIS — I2510 Atherosclerotic heart disease of native coronary artery without angina pectoris: Secondary | ICD-10-CM | POA: Diagnosis present

## 2017-05-20 DIAGNOSIS — I309 Acute pericarditis, unspecified: Secondary | ICD-10-CM | POA: Diagnosis not present

## 2017-05-20 DIAGNOSIS — I119 Hypertensive heart disease without heart failure: Secondary | ICD-10-CM | POA: Diagnosis present

## 2017-05-20 DIAGNOSIS — R748 Abnormal levels of other serum enzymes: Secondary | ICD-10-CM | POA: Diagnosis not present

## 2017-05-20 HISTORY — DX: Anemia, unspecified: D64.9

## 2017-05-20 HISTORY — DX: Pericardial effusion (noninflammatory): I31.3

## 2017-05-20 HISTORY — DX: Angina pectoris, unspecified: I20.9

## 2017-05-20 LAB — COMPREHENSIVE METABOLIC PANEL
ALBUMIN: 2.9 g/dL — AB (ref 3.5–5.0)
ALK PHOS: 64 U/L (ref 38–126)
ALT: 17 U/L (ref 17–63)
AST: 23 U/L (ref 15–41)
Anion gap: 7 (ref 5–15)
CALCIUM: 8.6 mg/dL — AB (ref 8.9–10.3)
CHLORIDE: 99 mmol/L — AB (ref 101–111)
CO2: 27 mmol/L (ref 22–32)
CREATININE: 0.76 mg/dL (ref 0.61–1.24)
GFR calc non Af Amer: >60 mL/min (ref >60)
GLUCOSE: 106 mg/dL — AB (ref 65–99)
Potassium: 3.7 mmol/L (ref 3.5–5.1)
SODIUM: 133 mmol/L — AB (ref 135–145)
Total Bilirubin: 1.2 mg/dL (ref 0.3–1.2)
Total Protein: 5.7 g/dL — ABNORMAL LOW (ref 6.5–8.1)

## 2017-05-20 LAB — CBC
HCT: 26.5 % — ABNORMAL LOW (ref 39.0–52.0)
HCT: 25.2 % — ABNORMAL LOW (ref 39.0–52.0)
Hemoglobin: 8.7 g/dL — ABNORMAL LOW (ref 13.0–17.0)
Hemoglobin: 8.2 g/dL — ABNORMAL LOW (ref 13.0–17.0)
MCH: 29.7 pg (ref 26.0–34.0)
MCH: 29.6 pg (ref 26.0–34.0)
MCHC: 32.8 g/dL (ref 30.0–36.0)
MCHC: 32.5 g/dL (ref 30.0–36.0)
MCV: 90.4 fL (ref 78.0–100.0)
MCV: 91 fL (ref 78.0–100.0)
PLATELETS: 273 10*3/uL (ref 150–400)
PLATELETS: 281 10*3/uL (ref 150–400)
RBC: 2.93 MIL/uL — ABNORMAL LOW (ref 4.22–5.81)
RBC: 2.77 MIL/uL — ABNORMAL LOW (ref 4.22–5.81)
RDW: 13 % (ref 11.5–15.5)
RDW: 13 % (ref 11.5–15.5)
WBC: 8.6 10*3/uL (ref 4.0–10.5)
WBC: 8.8 10*3/uL (ref 4.0–10.5)

## 2017-05-20 LAB — BASIC METABOLIC PANEL
ANION GAP: 7 (ref 5–15)
BUN: 6 mg/dL (ref 6–20)
CHLORIDE: 100 mmol/L — AB (ref 101–111)
CO2: 25 mmol/L (ref 22–32)
CREATININE: 0.78 mg/dL (ref 0.61–1.24)
Calcium: 8.6 mg/dL — ABNORMAL LOW (ref 8.9–10.3)
GFR calc non Af Amer: >60 mL/min (ref >60)
Glucose, Bld: 98 mg/dL (ref 65–99)
Potassium: 3.5 mmol/L (ref 3.5–5.1)
SODIUM: 132 mmol/L — AB (ref 135–145)

## 2017-05-20 LAB — TROPONIN I
TROPONIN I: 0.4 ng/mL — AB (ref <0.03)
Troponin I: 0.34 ng/mL (ref <0.03)
Troponin I: 0.37 ng/mL (ref <0.03)
Troponin I: 0.33 ng/mL (ref <0.03)

## 2017-05-20 LAB — FERRITIN: FERRITIN: 101 ng/mL (ref 24–336)

## 2017-05-20 LAB — TSH: TSH: 3.881 u[IU]/mL (ref 0.350–4.500)

## 2017-05-20 LAB — PROTIME-INR
INR: 1.15
INR: 1.11
PROTHROMBIN TIME: 14.2 s (ref 11.4–15.2)
Prothrombin Time: 14.6 seconds (ref 11.4–15.2)

## 2017-05-20 LAB — IRON AND TIBC
Iron: 40 ug/dL — ABNORMAL LOW (ref 45–182)
Saturation Ratios: 20 % (ref 17.9–39.5)
TIBC: 204 ug/dL — ABNORMAL LOW (ref 250–450)
UIBC: 164 ug/dL

## 2017-05-20 LAB — I-STAT TROPONIN, ED: Troponin i, poc: 0.2 ng/mL (ref 0.00–0.08)

## 2017-05-20 LAB — HEMOGLOBIN A1C
Hgb A1c MFr Bld: 4.7 % — ABNORMAL LOW (ref 4.8–5.6)
Mean Plasma Glucose: 88.19 mg/dL

## 2017-05-20 LAB — HEPARIN LEVEL (UNFRACTIONATED): HEPARIN UNFRACTIONATED: 0.19 [IU]/mL — AB (ref 0.30–0.70)

## 2017-05-20 LAB — BRAIN NATRIURETIC PEPTIDE: B Natriuretic Peptide: 514.7 pg/mL — ABNORMAL HIGH (ref 0.0–100.0)

## 2017-05-20 MED ORDER — PANTOPRAZOLE SODIUM 40 MG PO TBEC
40.0000 mg | DELAYED_RELEASE_TABLET | Freq: Two times a day (BID) | ORAL | Status: DC
  Administered 2017-05-20 – 2017-05-21 (×3): 40 mg via ORAL
  Filled 2017-05-20 (×3): qty 1

## 2017-05-20 MED ORDER — NITROGLYCERIN 2 % TD OINT
1.0000 [in_us] | TOPICAL_OINTMENT | Freq: Four times a day (QID) | TRANSDERMAL | Status: DC
  Administered 2017-05-20 (×2): 1 [in_us] via TOPICAL
  Filled 2017-05-20 (×2): qty 1

## 2017-05-20 MED ORDER — HEPARIN (PORCINE) IN NACL 100-0.45 UNIT/ML-% IJ SOLN
1300.0000 [IU]/h | INTRAMUSCULAR | Status: DC
  Administered 2017-05-20: 1000 [IU]/h via INTRAVENOUS
  Filled 2017-05-20 (×2): qty 250

## 2017-05-20 MED ORDER — IBUPROFEN 400 MG PO TABS
400.0000 mg | ORAL_TABLET | Freq: Three times a day (TID) | ORAL | Status: DC
  Administered 2017-05-20 – 2017-05-21 (×2): 400 mg via ORAL
  Filled 2017-05-20: qty 1
  Filled 2017-05-20 (×2): qty 2
  Filled 2017-05-20 (×3): qty 1

## 2017-05-20 MED ORDER — ISOSORBIDE MONONITRATE ER 30 MG PO TB24
30.0000 mg | ORAL_TABLET | Freq: Every day | ORAL | Status: DC
  Administered 2017-05-20 – 2017-05-21 (×2): 30 mg via ORAL
  Filled 2017-05-20 (×2): qty 1

## 2017-05-20 MED ORDER — METOPROLOL TARTRATE 25 MG PO TABS
25.0000 mg | ORAL_TABLET | Freq: Two times a day (BID) | ORAL | Status: DC
  Administered 2017-05-20 – 2017-05-21 (×3): 25 mg via ORAL
  Filled 2017-05-20 (×3): qty 1

## 2017-05-20 MED ORDER — ASPIRIN 81 MG PO CHEW: 324.0000 mg | CHEWABLE_TABLET | Freq: Once | ORAL | Status: DC

## 2017-05-20 MED ORDER — NITROGLYCERIN 0.4 MG SL SUBL
0.4000 mg | SUBLINGUAL_TABLET | SUBLINGUAL | Status: DC | PRN
  Filled 2017-05-20: qty 1

## 2017-05-20 MED ORDER — FERROUS SULFATE 325 (65 FE) MG PO TABS
325.0000 mg | ORAL_TABLET | Freq: Three times a day (TID) | ORAL | Status: DC
  Administered 2017-05-20 – 2017-05-21 (×3): 325 mg via ORAL
  Filled 2017-05-20 (×5): qty 1

## 2017-05-20 MED ORDER — ATORVASTATIN CALCIUM 80 MG PO TABS
80.0000 mg | ORAL_TABLET | Freq: Every day | ORAL | Status: DC
  Filled 2017-05-20: qty 1

## 2017-05-20 MED ORDER — NITROGLYCERIN 0.4 MG SL SUBL
0.4000 mg | SUBLINGUAL_TABLET | SUBLINGUAL | Status: DC | PRN
  Administered 2017-05-20: 0.4 mg via SUBLINGUAL

## 2017-05-20 MED ORDER — EZETIMIBE 10 MG PO TABS
10.0000 mg | ORAL_TABLET | Freq: Every day | ORAL | Status: DC
  Administered 2017-05-20 – 2017-05-21 (×2): 10 mg via ORAL
  Filled 2017-05-20 (×3): qty 1

## 2017-05-20 MED ORDER — ASPIRIN EC 81 MG PO TBEC
81.0000 mg | DELAYED_RELEASE_TABLET | Freq: Every day | ORAL | Status: DC
  Administered 2017-05-20: 81 mg via ORAL
  Filled 2017-05-20 (×2): qty 1

## 2017-05-20 MED ORDER — ASPIRIN EC 81 MG PO TBEC: 81.0000 mg | DELAYED_RELEASE_TABLET | Freq: Every day | ORAL | Status: DC

## 2017-05-20 MED ORDER — MORPHINE SULFATE (PF) 4 MG/ML IV SOLN
4.0000 mg | Freq: Once | INTRAVENOUS | Status: AC
  Administered 2017-05-20: 4 mg via INTRAVENOUS
  Filled 2017-05-20: qty 1

## 2017-05-20 MED ORDER — CLOPIDOGREL BISULFATE 75 MG PO TABS
75.0000 mg | ORAL_TABLET | Freq: Every day | ORAL | Status: DC
  Administered 2017-05-20 – 2017-05-21 (×2): 75 mg via ORAL
  Filled 2017-05-20 (×2): qty 1

## 2017-05-20 NOTE — Progress Notes (Signed)
ANTICOAGULATION CONSULT NOTE - Initial Consult  Pharmacy Consult for Heparin  Indication: chest pain/ACS  Allergies  Allergen Reactions  . Statins Other (See Comments)    Patient experienced myalgias after trying one statin several years ago. Not interested in trying another statin.     Patient Measurements: Height: 5\' 11"  (180.3 cm) Weight: 225 lb (102.1 kg) IBW/kg (Calculated) : 75.3  Vital Signs: Temp: 98 F (36.7 C) (10/02 0013) Temp Source: Oral (10/02 0013) BP: 176/91 (10/02 0230) Pulse Rate: 102 (10/02 0230)  Labs:  Recent Labs  05/18/17 0916 05/20/17 0132  HGB 8.2* 8.7*  HCT 25.3* 26.5*  PLT 225 273  LABPROT  --  14.6  INR  --  1.15  CREATININE  --  0.78  TROPONINI  --  0.34*    Estimated Creatinine Clearance: 103 mL/min (by C-G formula based on SCr of 0.78 mg/dL).   Medical History: Past Medical History:  Diagnosis Date  . CAD (coronary artery disease)    a. 02/2017: NSTEMI with cath showing 100% Prox Cx stenosis with 95% Mid Cx stenosis and 45% mid-LAD stenosis. Unsuccessful attempt at crossing an occluded mid nondominant circumflex --> plan for staged PCI in coming weeks  . Chest pain 02/2017  . Dyspnea   . GERD (gastroesophageal reflux disease)   . High cholesterol   . History of gout   . Hypertension   . Macular degeneration   . NSTEMI (non-ST elevated myocardial infarction) ([place]) 02/2017    Assessment: 71 y/o M with known CAD s/p unsuccessful PCI on 0/08 that was complicated by coronary perforation and recurrent pericardial effusion. Plan was to re-start DAPT today as outpatient. Hgb 8.7. Discussed with cardiology risk vs benefit of starting heparin with recent effusion, wishes to proceed for now and monitor closely.   Goal of Therapy:  Heparin level 0.3-0.5 units/ml, in setting of recent pericardial effusion Monitor platelets by anticoagulation protocol: Yes   Plan:  -NO BOLUS -Start heparin drip at 1000 units/hr -1200 HL -Daily  CBC/HL -Trend Hgb, monitor closely for s/s of bleeding/recurrent pericardial effusion  Narda Bonds 05/20/2017,4:08 AM

## 2017-05-20 NOTE — H&P (Signed)
CARDIOLOGY INPATIENT HISTORY AND PHYSICAL EXAMINATION NOTE  Patient ID: Joseph Mercado MRN: [id number], DOB/AGE: February 01, 1946   Admit date: 05/20/2017   Primary Physician: Abigail Miyamoto, MD Primary Cardiologist: Verdis Prime MD  Reason for admission: chest pain   HPI: This is a 71 y.o. male with known CAD and CTO of OM, HTN, GERD, diastolic heart failure, presnted with chest pain today.  Patient initially presented in 7/18 with NSTEMI at which time he was found to have occluded mid non dominant LCx which was unsuccessfully attempted. He was again seen in clinic on 9/6 and due to persistence of symptoms had a 2nd CTO PCI attempt on 9/26 complicated by coronary perforation and development of a cardiac tamponade that was treated with a pigtail drain. When the drain was removed the next day he had reaccumulation of his pericardial effusion and hence required subxyphoid pericardial window by CTSx on 05/15/17.with 100 cc bloody fluid drained. Drain was removed on 05/17/17 and his systolics maintained in the 120s. On 05/18/17, he was stable. Repeat CBC on 9/30 showed Hb of 8.2. Bedside echocardiogram prior to d/c was negative for pericardial effusion. However patient went home and developed substernal chest pain around 10 pm with radiation to back and he came right back to get evaluated. He took  3 x NTG with partial relief. He came via EMS.  Currently he still has cp 2/10.  No bleeding. History of intolerance to statins.  Patient presented for CTO procedure on 05/14/17.     Problem List: Past Medical History:  Diagnosis Date  . CAD (coronary artery disease)    a. 02/2017: NSTEMI with cath showing 100% Prox Cx stenosis with 95% Mid Cx stenosis and 45% mid-LAD stenosis. Unsuccessful attempt at crossing an occluded mid nondominant circumflex --> plan for staged PCI in coming weeks  . Chest pain 02/2017  . Dyspnea   . GERD (gastroesophageal reflux disease)   . High cholesterol   . History of  gout   . Hypertension   . Macular degeneration   . NSTEMI (non-ST elevated myocardial infarction) (HCC) 02/2017    Past Surgical History:  Procedure Laterality Date  . CATARACT EXTRACTION W/ INTRAOCULAR LENS  IMPLANT, BILATERAL Bilateral   . CORONARY ANGIOPLASTY    . CORONARY BALLOON ANGIOPLASTY N/A 03/10/2017   Procedure: Coronary Balloon Angioplasty;  Surgeon: Runell Gess, MD;  Location: [facility] INVASIVE CV LAB;  Service: Cardiovascular;  Laterality: N/A;  CFX  . CORONARY CTO INTERVENTION N/A 05/14/2017   Procedure: CORONARY CTO INTERVENTION;  Surgeon: Swaziland, Peter M, MD;  Location: [facility] INVASIVE CV LAB;  Service: Cardiovascular;  Laterality: N/A;  . LEFT HEART CATH AND CORONARY ANGIOGRAPHY N/A 03/10/2017   Procedure: Left Heart Cath and Coronary Angiography;  Surgeon: Runell Gess, MD;  Location: [facility] INVASIVE CV LAB;  Service: Cardiovascular;  Laterality: N/A;  . PERICARDIOCENTESIS N/A 05/14/2017   Procedure: PERICARDIOCENTESIS;  Surgeon: Swaziland, Peter M, MD;  Location: [facility] INVASIVE CV LAB;  Service: Cardiovascular;  Laterality: N/A;  . SUBXYPHOID PERICARDIAL WINDOW N/A 05/15/2017   Procedure: SUBXYPHOID PERICARDIAL WINDOW WITH DRAINAGE OF PERICARDIAL EFFUSION;  Surgeon: Alleen Borne, MD;  Location: [facility] OR;  Service: Thoracic;  Laterality: N/A;  . ULTRASOUND GUIDANCE FOR VASCULAR ACCESS  05/14/2017   Procedure: Ultrasound Guidance For Vascular Access;  Surgeon: Swaziland, Peter M, MD;  Location: [facility] INVASIVE CV LAB;  Service: Cardiovascular;;     Allergies:  Allergies  Allergen Reactions  . Statins Other (See Comments)    Patient  experienced myalgias after trying one statin several years ago. Not interested in trying another statin.      Home Medications Current Facility-Administered Medications  Medication Dose Route Frequency Provider Last Rate Last Dose  . morphine 4 MG/ML injection 4 mg  4 mg Intravenous Once Nanavati, Ankit, MD      . nitroGLYCERIN (NITROGLYN) 2 % ointment 1  inch  1 inch Topical Q6H Nanavati, Ankit, MD      . nitroGLYCERIN (NITROSTAT) SL tablet 0.4 mg  0.4 mg Sublingual Q5 min PRN Derwood Kaplan, MD       Current Outpatient Prescriptions  Medication Sig Dispense Refill  . aspirin 81 MG EC tablet Take 1 tablet (81 mg total) by mouth daily. Resume on 05/20/17. 30 tablet 12  . clopidogrel (PLAVIX) 75 MG tablet Take 1 tablet (75 mg total) by mouth daily. Resume on 05/20/17. 90 tablet 3  . ezetimibe (ZETIA) 10 MG tablet Take 1 tablet (10 mg total) by mouth daily. (Patient taking differently: Take 10 mg by mouth every morning. ) 30 tablet 6  . isosorbide mononitrate (IMDUR) 30 MG 24 hr tablet Take 1 tablet (30 mg total) by mouth daily. 90 tablet 4  . metoprolol tartrate (LOPRESSOR) 50 MG tablet Take 25 mg by mouth 2 (two) times daily.     . Multiple Vitamins-Minerals (PRESERVISION AREDS PO) Take 1 capsule by mouth 2 (two) times daily.    . nitroGLYCERIN (NITROSTAT) 0.4 MG SL tablet Place 0.4 mg under the tongue every 5 (five) minutes as needed for chest pain.     . pantoprazole (PROTONIX) 40 MG tablet Take 40 mg by mouth 2 (two) times daily.        Family History  Problem Relation Age of Onset  . CAD Brother        CABG in his 55's     Social History   Social History  . Marital status: Divorced    Spouse name: N/A  . Number of children: N/A  . Years of education: N/A   Occupational History  . Not on file.   Social History Main Topics  . Smoking status: Former Smoker    Packs/day: 3.00    Years: 20.00    Types: Cigarettes  . Smokeless tobacco: Former Neurosurgeon    Types: Chew     Comment: "quit smoking in the 1980s; chewed 4-5 years after I quit smoking"  . Alcohol use 42.0 oz/week    70 Cans of beer Joseph week     Comment: 05/14/2017 "6-10 beers/day"  . Drug use: No  . Sexual activity: Not on file   Other Topics Concern  . Not on file   Social History Narrative  . No narrative on file     Review of Systems: General: negative for  chills, fever, night sweats or weight changes.  Cardiovascular: chest pain, dyspnea Dermatological: negative for rash Respiratory: negative for cough or wheezing Urologic: negative for hematuria Abdominal: negative for nausea, vomiting, diarrhea, bright red blood Joseph rectum, melena, or hematemesis Neurologic: negative for visual changes, syncope, or dizziness Endocrine: no diabetes, no hypothyroidism Immunological: no lymph adenopathy Psych: non homicidal/suicidal  Physical Exam: Vitals: BP (!) 162/83   Pulse (!) 101   Temp 98 F (36.7 C) (Oral)   Resp (!) 25   Ht 5\' 11"  (1.803 m)   Wt 102.1 kg (225 lb)   SpO2 96%   BMI 31.38 kg/m  General: not in acute distress Neck: JVP flat, neck supple Heart: regular  rate and rhythm, S1, S2, no murmurs  Lungs: CTAB  GI: non tender, non distended, bowel sounds present Extremities: no edema Neuro: AAO x 3  Psych: normal affect, no anxiety   Labs:  No results found for this or any previous visit (from the past 24 hour(s)).   Radiology/Studies: Dg Chest Portable 1 View  Result Date: 05/20/2017 CLINICAL DATA:  Chest pain. Angioplasty last week. Previous coronary stent placement. EXAM: PORTABLE CHEST 1 VIEW COMPARISON:  05/15/2017 FINDINGS: Lordotic technique is demonstrated. Lungs are adequately inflated without focal airspace consolidation or effusion. Stable cardiomegaly. Minimal degenerate change of the spine. IMPRESSION: No acute cardiopulmonary disease. Stable cardiomegaly. Electronically Signed   By: Elberta Fortis M.D.   On: 05/20/2017 01:21   Dg Chest Port 1 View  Result Date: 05/15/2017 CLINICAL DATA:  Sub xiphoid pericardial window for drainage of hemopericardium. EXAM: PORTABLE CHEST 1 VIEW COMPARISON:  03/10/2017 chest radiograph FINDINGS: Upper limits normal heart size again noted. This is a low volume film with mild bibasilar atelectasis. There is no evidence of pneumothorax. A right IJ central venous catheter sheath is present.  No acute bony abnormalities are identified. IMPRESSION: Low volume film with mild bibasilar atelectasis. Electronically Signed   By: Harmon Pier M.D.   On: 05/15/2017 18:14    EKG:05/20/17  sinus tachycardia, incomplete RBBB with LAFB  Echo: 05/15/2017 - Left ventricle: The cavity size was normal. Wall thickness was   normal. Systolic function was vigorous. The estimated ejection   fraction was in the range of 65% to 70%. - Right ventricle: The cavity size was normal. Systolic function   was normal. - Pericardium, extracardiac: There was a moderate-sized pericardial   effusion that was partially organized with fibrinous material.   There was no RV diastolic collapse. Suspect no tamponade.  Nuclear perfusion imaging 02/26/2017  Inferior (basal) and inferolateral (base, mid) thinning with mild improvement in recovery Does not appear to be significant for ischemia probably reflects soft tissue attenuation (attenuation)  Nuclear stress EF: 61%.  Low risk scan    Cardiac cath:  05/14/2017  Mid LAD lesion, 45 %stenosed.  Mid Cx lesion, 100 %stenosed.  Post intervention, there is a 100% residual stenosis.  Prox Cx to Mid Cx lesion, 95 %stenosed.  Post intervention, there is a 30% residual stenosis.   1. Unsuccessful CTO PCI of the LCx. There is some improvement of flow into the small OM with PTCA.    Medical decision making:  Discussed care with the patient Discussed care with the physician on the phone Reviewed labs and imaging personally Reviewed prior records  ASSESSMENT AND PLAN:  This is a 71 y.o. male with NSTEMI s/p unsuccessful PCI presented with another episode of NSTEMI.     Active Problems:   Essential hypertension   Angina pectoris (HCC)   NSTEMI (non-ST elevated myocardial infarction) (HCC)   CAD (coronary artery disease)   Hyperlipidemia LDL goal <70   Chest pain, likely non cardiac in origin  - checking CRP, cycle troponin, echocardiogram in AM, NPO  post midnight, CBC, CMP, INR/PTT, TSH, HbA1c, lipid panel  Hypertension, essential Lisinopril was stopped and imdur was added after d/c Goal <140/90  Coronary artery disease Patient's DAPT was held and was planned to be started  On 10/2.   Recent pericardial effusion Repeat echo  Hyperlipidemia, mixed Lipid panel ordered, ezetimbe. Started on lipitor after discussing with the patient.   Anemia Iron studies, iron tabs, goal hb 10  Signed, Joellyn Rued, MD  MS 05/20/2017, 1:42 AM

## 2017-05-20 NOTE — Progress Notes (Signed)
Progress Note  Patient Name: Joseph Mercado Date of Encounter: 05/20/2017  Primary Cardiologist: Dr. Gwenlyn Found  Subjective   CP improved  Inpatient Medications    Scheduled Meds: . aspirin EC  81 mg Oral Daily  . atorvastatin  80 mg Oral q1800  . clopidogrel  75 mg Oral Daily  . ezetimibe  10 mg Oral Daily  . ferrous sulfate  325 mg Oral TID WC  . isosorbide mononitrate  30 mg Oral Daily  . metoprolol tartrate  25 mg Oral BID  . nitroGLYCERIN  1 inch Topical Q6H  . pantoprazole  40 mg Oral BID   Continuous Infusions:  PRN Meds: nitroGLYCERIN, nitroGLYCERIN   Vital Signs    Vitals:   05/20/17 1630 05/20/17 1645 05/20/17 1700 05/20/17 1715  BP: 138/75 (!) 142/81 135/81 (!) 146/76  Pulse: 90 89 89 89  Resp: (!) 22 (!) 23 (!) 23 (!) 23  Temp:      TempSrc:      SpO2: 93% 96% 95% 95%  Weight:      Height:        Intake/Output Summary (Last 24 hours) at 05/20/17 1742 Last data filed at 05/20/17 1737  Gross per 24 hour  Intake                0 ml  Output              800 ml  Net             -800 ml   Filed Weights   05/20/17 0023  Weight: 225 lb (102.1 kg)    Telemetry    NSR - Personally Reviewed  ECG    NSR, NSST, no change from prior - Personally Reviewed  Physical Exam   GEN: No acute distress.   Neck: No JVD Cardiac: RRR, no murmurs, rubs, or gallops.  Respiratory: Clear to auscultation bilaterally. GI: Soft, nontender, non-distended  MS: No edema; No deformity. Neuro:  Nonfocal  Psych: Normal affect   Labs    Chemistry Recent Labs Lab 05/16/17 0349 05/20/17 0132 05/20/17 0357  NA 132* 132* 133*  K 4.6 3.5 3.7  CL 102 100* 99*  CO2 24 25 27   GLUCOSE 155* 98 106*  BUN 8 6 <5*  CREATININE 0.89 0.78 0.76  CALCIUM 8.4* 8.6* 8.6*  PROT  --   --  5.7*  ALBUMIN  --   --  2.9*  AST  --   --  23  ALT  --   --  17  ALKPHOS  --   --  64  BILITOT  --   --  1.2  GFRNONAA >60 >60 >60  GFRAA >60 >60 >60  ANIONGAP 6 7 7       Hematology Recent Labs Lab 05/18/17 0916 05/20/17 0132 05/20/17 0357  WBC 10.0 8.6 8.8  RBC 2.76* 2.93* 2.77*  HGB 8.2* 8.7* 8.2*  HCT 25.3* 26.5* 25.2*  MCV 91.7 90.4 91.0  MCH 29.7 29.7 29.6  MCHC 32.4 32.8 32.5  RDW 13.2 13.0 13.0  PLT 225 273 281    Cardiac Enzymes Recent Labs Lab 05/20/17 0132 05/20/17 0331 05/20/17 0938  TROPONINI 0.34* 0.37* 0.40*    Recent Labs Lab 05/20/17 0155  TROPIPOC 0.20*     BNP Recent Labs Lab 05/20/17 0357  BNP 514.7*     DDimer No results for input(s): DDIMER in the last 168 hours.   Radiology    Dg Chest Portable 1 View  Result Date: 05/20/2017 CLINICAL DATA:  Chest pain. Angioplasty last week. Previous coronary stent placement. EXAM: PORTABLE CHEST 1 VIEW COMPARISON:  05/15/2017 FINDINGS: Lordotic technique is demonstrated. Lungs are adequately inflated without focal airspace consolidation or effusion. Stable cardiomegaly. Minimal degenerate change of the spine. IMPRESSION: No acute cardiopulmonary disease. Stable cardiomegaly. Electronically Signed   By: Marin Olp M.D.   On: 05/20/2017 01:21    Cardiac Studies   Cath with microperforation causing effusion and pericardial effusion  Patient Profile     71 y.o. male with CAD s/p pericardial window a week ago  Assessment & Plan    1) CAD:  Flat troponin trend.  Doubt ACS.  Elevated troponin most likely due to recent trauma, and CP may be more related to pericarditis, although he does not have the classic ECG changes.  Stopped heparin IV given recent bleeding issue.  Stitch still in place from drain placement.    Hyperlipidemia: Continue lipid lowering therapy.  For questions or updates, please contact [place] Please consult [email or web address] for contact info under Cardiology/STEMI.      Signed, Larae Grooms, MD  05/20/2017, 5:42 PM

## 2017-05-20 NOTE — Progress Notes (Signed)
[facility] for Heparin  Indication: chest pain/ACS  Allergies  Allergen Reactions  . Statins Other (See Comments)    Patient experienced myalgias after trying one statin several years ago. Not interested in trying another statin.     Patient Measurements: Height: 5\' 11"  (180.3 cm) Weight: 225 lb (102.1 kg) IBW/kg (Calculated) : 75.3  Vital Signs: BP: 141/79 (10/02 1345) Pulse Rate: 82 (10/02 1345)  Labs:  Recent Labs  05/18/17 0916 05/20/17 0132 05/20/17 0331 05/20/17 0357 05/20/17 0938 05/20/17 1307  HGB 8.2* 8.7*  --  8.2*  --   --   HCT 25.3* 26.5*  --  25.2*  --   --   PLT 225 273  --  281  --   --   LABPROT  --  14.6  --  14.2  --   --   INR  --  1.15  --  1.11  --   --   HEPARINUNFRC  --   --   --   --   --  0.19*  CREATININE  --  0.78  --  0.76  --   --   TROPONINI  --  0.34* 0.37*  --  0.40*  --     Estimated Creatinine Clearance: 103 mL/min (by C-G formula based on SCr of 0.76 mg/dL).  Assessment: 71 y/o M with known CAD s/p unsuccessful PCI on 4/78 that was complicated by coronary perforation and recurrent pericardial effusion. Plan was to re-start DAPT today as outpatient. Hgb 8.7. Discussed with cardiology risk vs benefit of starting heparin with recent effusion, wishes to proceed for now and monitor closely. Initial heparin level is low at 0.19. No bleeding noted.   Goal of Therapy:  Heparin level 0.3-0.5 units/ml, in setting of recent pericardial effusion Monitor platelets by anticoagulation protocol: Yes   Plan:  Increase heparin gtt to 1300 units/hr - NO BOLUS Check an 8 hr heparin level Daily heparin level and CBC  Kymia Simi, Rande Lawman 05/20/2017,2:11 PM

## 2017-05-20 NOTE — ED Triage Notes (Addendum)
Pt had angioplasty on Wed; released on Sunday.  100% occlusion s/p MI in July; unable to place stent. Took 2 nitro at home for 5/10 chest pressure that starts on L and radiates across, decreased to 2/10. Shoulders and neck hurt when pain increases.   20G R AC. Per EMS, abnormal EKG rhythm; pt has no history of such.

## 2017-05-20 NOTE — ED Provider Notes (Signed)
[place] DEPT Provider Note   CSN: [id number] Arrival date & time: 05/20/17  0009     History   Chief Complaint Chief Complaint  Patient presents with  . Chest Pain    HPI  HPI Joseph Mercado is a 71 y.o. male. Presents with "squeezing-pressure" substernal chest pain with severity of 5/10.  Radiated to shoulders (bilateral) and neck.  Pain begin at 10:30 pm.  Patient took 3 x  nitroglycerin tablet 0.4 mg.  Pain stabilized at 2/10 but did not abate and is getting worse again. Chest pain was accompanied shortness of breath .  States pain is similar to the sensation felt on 03/10/17 when he had NSTEMI. No nausea, vomiting, LOC, PND, fever, tachypnea or diaphoresis associated with  pain  PMH is significant for 100% unrepaired mid nondominant circumflex arterty stenosis, Pericardial effusion, Pericardial tamponade Subxyphoid pericardial window LVH diastolic dysfunction (grade 2), and HLD.   Past Medical History:  Diagnosis Date  . CAD (coronary artery disease)    a. 02/2017: NSTEMI with cath showing 100% Prox Cx stenosis with 95% Mid Cx stenosis and 45% mid-LAD stenosis. Unsuccessful attempt at crossing an occluded mid nondominant circumflex --> plan for staged PCI in coming weeks  . Chest pain 02/2017  . Dyspnea   . GERD (gastroesophageal reflux disease)   . High cholesterol   . History of gout   . Hypertension   . Macular degeneration   . NSTEMI (non-ST elevated myocardial infarction) ([place]) 02/2017    Patient Active Problem List   Diagnosis Date Noted  . Anemia 05/20/2017  . Pericardial effusion 05/15/2017  . Cardiac/pericardial tamponade 05/14/2017  . Pericardial tamponade   . CAD (coronary artery disease) 03/11/2017  . Hyperlipidemia LDL goal <70 03/11/2017  . NSTEMI (non-ST elevated myocardial infarction) ([place]) 03/10/2017  . Essential hypertension 02/05/2017  . Dyspnea on exertion 02/05/2017  . Angina pectoris ([place]) 02/05/2017    Past Surgical History:  Procedure  Laterality Date  . CATARACT EXTRACTION W/ INTRAOCULAR LENS  IMPLANT, BILATERAL Bilateral   . CORONARY ANGIOPLASTY    . CORONARY BALLOON ANGIOPLASTY N/A 03/10/2017   Procedure: Coronary Balloon Angioplasty;  Surgeon: Lorretta Harp, MD;  Location: [facility];  Service: Cardiovascular;  Laterality: N/A;  CFX  . CORONARY CTO INTERVENTION N/A 05/14/2017   Procedure: CORONARY CTO INTERVENTION;  Surgeon: Martinique, Peter M, MD;  Location: [facility];  Service: Cardiovascular;  Laterality: N/A;  . LEFT HEART CATH AND CORONARY ANGIOGRAPHY N/A 03/10/2017   Procedure: Left Heart Cath and Coronary Angiography;  Surgeon: Lorretta Harp, MD;  Location: [facility];  Service: Cardiovascular;  Laterality: N/A;  . PERICARDIOCENTESIS N/A 05/14/2017   Procedure: PERICARDIOCENTESIS;  Surgeon: Martinique, Peter M, MD;  Location: [facility];  Service: Cardiovascular;  Laterality: N/A;  . SUBXYPHOID PERICARDIAL WINDOW N/A 05/15/2017   Procedure: SUBXYPHOID PERICARDIAL WINDOW WITH DRAINAGE OF PERICARDIAL EFFUSION;  Surgeon: Gaye Pollack, MD;  Location: [facility];  Service: Thoracic;  Laterality: N/A;  . ULTRASOUND GUIDANCE FOR VASCULAR ACCESS  05/14/2017   Procedure: Ultrasound Guidance For Vascular Access;  Surgeon: Martinique, Peter M, MD;  Location: [facility];  Service: Cardiovascular;;       Home Medications    Prior to Admission medications   Medication Sig Start Date End Date Taking? Authorizing Provider  aspirin 81 MG EC tablet Take 1 tablet (81 mg total) by mouth daily. Resume on 05/20/17. 05/18/17  Yes Duke, Tami Lin, PA  clopidogrel (PLAVIX) 75 MG tablet Take 1 tablet (75 mg total) by mouth daily. Resume on 05/20/17. 05/18/17  Yes Duke, Tami Lin, PA  ezetimibe (ZETIA) 10 MG tablet Take 1 tablet (10 mg total) by mouth daily. Patient taking differently: Take 10 mg by mouth every morning.  03/11/17  Yes Strader, [place] M, PA-C  isosorbide mononitrate (IMDUR) 30 MG 24 hr  tablet Take 1 tablet (30 mg total) by mouth daily. 05/19/17  Yes Duke, Tami Lin, PA  metoprolol tartrate (LOPRESSOR) 50 MG tablet Take 25 mg by mouth 2 (two) times daily.  01/27/17  Yes [provider]  Multiple Vitamins-Minerals (PRESERVISION AREDS PO) Take 1 capsule by mouth 2 (two) times daily.   Yes [provider]  nitroGLYCERIN (NITROSTAT) 0.4 MG SL tablet Place 0.4 mg under the tongue every 5 (five) minutes as needed for chest pain.  01/07/17  Yes [provider]  pantoprazole (PROTONIX) 40 MG tablet Take 40 mg by mouth 2 (two) times daily.  01/07/17  Yes [provider]    Family History Family History  Problem Relation Age of Onset  . CAD Brother        CABG in his 15's    Social History Social History  Substance Use Topics  . Smoking status: Former Smoker    Packs/day: 3.00    Years: 20.00    Types: Cigarettes  . Smokeless tobacco: Former Systems developer    Types: Chew     Comment: "quit smoking in the 1980s; chewed 4-5 years after I quit smoking"  . Alcohol use 42.0 oz/week    70 Cans of beer per week     Comment: 05/14/2017 "6-10 beers/day"     Allergies   Statins   Review of Systems Review of Systems  Constitutional: Positive for activity change.  Respiratory: Positive for chest tightness.   Cardiovascular: Positive for chest pain.  Hematological: Does not bruise/bleed easily.  All other systems reviewed and are negative.    Physical Exam Updated Vital Signs BP (!) 176/91   Pulse (!) 102   Temp 98 F (36.7 C) (Oral)   Resp (!) 23   Ht 5\' 11"  (1.803 m)   Wt 102.1 kg (225 lb)   SpO2 95%   BMI 31.38 kg/m   Physical Exam  Constitutional: He is oriented to person, place, and time. He appears well-developed.  HENT:  Head: Atraumatic.  Neck: Neck supple.  Cardiovascular: Normal rate and intact distal pulses.   Pulmonary/Chest: Effort normal.  Musculoskeletal: He exhibits no edema or tenderness.  Neurological: He is alert  and oriented to person, place, and time.  Skin: Skin is warm.  Nursing note and vitals reviewed.    ED Treatments / Results  Labs (all labs ordered are listed, but only abnormal results are displayed) Labs Reviewed  BASIC METABOLIC PANEL - Abnormal; Notable for the following:       Result Value   Sodium 132 (*)    Chloride 100 (*)    Calcium 8.6 (*)    All other components within normal limits  CBC - Abnormal; Notable for the following:    RBC 2.93 (*)    Hemoglobin 8.7 (*)    HCT 26.5 (*)    All other components within normal limits  TROPONIN I - Abnormal; Notable for the following:    Troponin I 0.34 (*)    All other components within normal limits  I-STAT TROPONIN, ED - Abnormal; Notable for the following:  Troponin i, poc 0.20 (*)    All other components within normal limits  PROTIME-INR  TROPONIN I  BRAIN NATRIURETIC PEPTIDE  TSH  TROPONIN I  TROPONIN I  TROPONIN I  HEMOGLOBIN A1C  COMPREHENSIVE METABOLIC PANEL  CBC  PROTIME-INR  IRON AND TIBC  FERRITIN  CBG MONITORING, ED    EKG  EKG Interpretation  Date/Time:  Tuesday May 20 2017 00:10:02 EDT Ventricular Rate:  102 PR Interval:    QRS Duration: 100 QT Interval:  352 QTC Calculation: 459 R Axis:   -58 Text Interpretation:  Sinus tachycardia Incomplete RBBB and LAFB Abnormal R-wave progression, late transition No acute changes No significant change since last tracing Confirmed by Varney Biles [phone]) on 05/20/2017 1:12:15 AM       Radiology Dg Chest Portable 1 View  Result Date: 05/20/2017 CLINICAL DATA:  Chest pain. Angioplasty last week. Previous coronary stent placement. EXAM: PORTABLE CHEST 1 VIEW COMPARISON:  05/15/2017 FINDINGS: Lordotic technique is demonstrated. Lungs are adequately inflated without focal airspace consolidation or effusion. Stable cardiomegaly. Minimal degenerate change of the spine. IMPRESSION: No acute cardiopulmonary disease. Stable cardiomegaly. Electronically  Signed   By: Marin Olp M.D.   On: 05/20/2017 01:21    Procedures Procedures (including critical care time) CRITICAL CARE Performed by: Varney Biles   Total critical care time: 37 minutes  Critical care time was exclusive of separately billable procedures and treating other patients.  Critical care was necessary to treat or prevent imminent or life-threatening deterioration.  Critical care was time spent personally by me on the following activities: development of treatment plan with patient and/or surrogate as well as nursing, discussions with consultants, evaluation of patient's response to treatment, examination of patient, obtaining history from patient or surrogate, ordering and performing treatments and interventions, ordering and review of laboratory studies, ordering and review of radiographic studies, pulse oximetry and re-evaluation of patient's condition.   Medications Ordered in ED Medications  nitroGLYCERIN (NITROSTAT) SL tablet 0.4 mg (not administered)  nitroGLYCERIN (NITROGLYN) 2 % ointment 1 inch (1 inch Topical Given 05/20/17 0145)  metoprolol tartrate (LOPRESSOR) tablet 25 mg (not administered)  nitroGLYCERIN (NITROSTAT) SL tablet 0.4 mg (not administered)  pantoprazole (PROTONIX) EC tablet 40 mg (not administered)  ezetimibe (ZETIA) tablet 10 mg (not administered)  aspirin EC tablet 81 mg (not administered)  clopidogrel (PLAVIX) tablet 75 mg (not administered)  isosorbide mononitrate (IMDUR) 24 hr tablet 30 mg (not administered)  aspirin EC tablet 81 mg (not administered)  atorvastatin (LIPITOR) tablet 80 mg (not administered)  ferrous sulfate tablet 325 mg (not administered)  morphine 4 MG/ML injection 4 mg (4 mg Intravenous Given 05/20/17 0233)     Initial Impression / Assessment and Plan / ED Course  I have reviewed the triage vital signs and the nursing notes.  Pertinent labs & imaging results that were available during my care of the patient were  reviewed by me and considered in my medical decision making (see chart for details).     Differential diagnosis includes: ACS syndrome Aortic dissection Pericarditis Endocarditis Pericardial effusion / tamponade PE Pneumothorax Esophageal spasm  Pt with known CAD being managed medically and recent complications of pericardial effusion requiring pericardial window comes in with cc of chest pain. Chest pain is typical of ischemia - as it is pressure like pain radiating to the shoulders. Pt has intact distal pulses and due to recent cath, dissection considered in the ddx but the pretest probability is lower than ACS. Trops are elevated. Called  Cards - they will see there patient and start anticoagulation and antiplatelets. EKG is reassuring.  Final Clinical Impressions(s) / ED Diagnoses   Final diagnoses:  NSTEMI (non-ST elevated myocardial infarction) [facility])    New Prescriptions New Prescriptions   No medications on file     Varney Biles, MD 05/20/17 [phone]

## 2017-05-21 ENCOUNTER — Encounter (HOSPITAL_COMMUNITY): Payer: Self-pay | Admitting: General Practice

## 2017-05-21 ENCOUNTER — Inpatient Hospital Stay (HOSPITAL_COMMUNITY): Payer: Medicare Other

## 2017-05-21 DIAGNOSIS — D649 Anemia, unspecified: Secondary | ICD-10-CM | POA: Diagnosis not present

## 2017-05-21 DIAGNOSIS — I5030 Unspecified diastolic (congestive) heart failure: Secondary | ICD-10-CM | POA: Diagnosis not present

## 2017-05-21 DIAGNOSIS — E785 Hyperlipidemia, unspecified: Secondary | ICD-10-CM | POA: Diagnosis not present

## 2017-05-21 DIAGNOSIS — I110 Hypertensive heart disease with heart failure: Secondary | ICD-10-CM | POA: Diagnosis not present

## 2017-05-21 DIAGNOSIS — I309 Acute pericarditis, unspecified: Secondary | ICD-10-CM

## 2017-05-21 DIAGNOSIS — R748 Abnormal levels of other serum enzymes: Secondary | ICD-10-CM | POA: Diagnosis not present

## 2017-05-21 DIAGNOSIS — I340 Nonrheumatic mitral (valve) insufficiency: Secondary | ICD-10-CM | POA: Diagnosis not present

## 2017-05-21 DIAGNOSIS — I25119 Atherosclerotic heart disease of native coronary artery with unspecified angina pectoris: Secondary | ICD-10-CM | POA: Diagnosis not present

## 2017-05-21 DIAGNOSIS — I214 Non-ST elevation (NSTEMI) myocardial infarction: Secondary | ICD-10-CM | POA: Diagnosis not present

## 2017-05-21 LAB — BASIC METABOLIC PANEL
Anion gap: 8 (ref 5–15)
BUN: 9 mg/dL (ref 6–20)
CALCIUM: 8.9 mg/dL (ref 8.9–10.3)
CO2: 27 mmol/L (ref 22–32)
CREATININE: 0.88 mg/dL (ref 0.61–1.24)
Chloride: 98 mmol/L — ABNORMAL LOW (ref 101–111)
GFR calc non Af Amer: >60 mL/min (ref >60)
Glucose, Bld: 98 mg/dL (ref 65–99)
Potassium: 4.7 mmol/L (ref 3.5–5.1)
SODIUM: 133 mmol/L — AB (ref 135–145)

## 2017-05-21 LAB — CBC
HEMATOCRIT: 25.7 % — AB (ref 39.0–52.0)
HEMOGLOBIN: 8.4 g/dL — AB (ref 13.0–17.0)
MCH: 29.9 pg (ref 26.0–34.0)
MCHC: 32.7 g/dL (ref 30.0–36.0)
MCV: 91.5 fL (ref 78.0–100.0)
Platelets: 306 10*3/uL (ref 150–400)
RBC: 2.81 MIL/uL — ABNORMAL LOW (ref 4.22–5.81)
RDW: 13.2 % (ref 11.5–15.5)
WBC: 8.6 10*3/uL (ref 4.0–10.5)

## 2017-05-21 LAB — ECHOCARDIOGRAM COMPLETE
HEIGHTINCHES: 71 in
WEIGHTICAEL: 3553.6 [oz_av]

## 2017-05-21 LAB — GLUCOSE, CAPILLARY: Glucose-Capillary: 96 mg/dL (ref 65–99)

## 2017-05-21 MED ORDER — FERROUS SULFATE 325 (65 FE) MG PO TABS: 325.0000 mg | ORAL_TABLET | Freq: Three times a day (TID) | ORAL | 1 refills | Status: DC

## 2017-05-21 NOTE — Discharge Summary (Addendum)
Discharge Summary    Patient ID: Joseph Mercado,  MRN: [id number], DOB/AGE: November 09, 1945 71 y.o.  Admit date: 05/20/2017 Discharge date: 05/21/2017  Primary Care Provider: Abigail Miyamoto Primary Cardiologist: Allyson Sabal   Discharge Diagnoses    Active Problems:   Essential hypertension   Angina pectoris [facility])   NSTEMI (non-ST elevated myocardial infarction) ([facility])   CAD (coronary artery disease)   Hyperlipidemia LDL goal <70   Anemia   Allergies Allergies  Allergen Reactions  . Statins Other (See Comments)    Patient experienced myalgias after trying one statin several years ago. Not interested in trying another statin.     Diagnostic Studies/Procedures     _____________   History of Present Illness     71 y.o. male with known CAD and CTO of Lcx, HTN, GERD, diastolic heart failure, who presented with chest pain.  Patient initially presented in 7/18 with NSTEMI at which time he was found to have occluded mid non dominant LCx which was unsuccessfully attempted. He was again seen in clinic on 9/6 and due to persistence of symptoms had a 2nd CTO PCI attempt on 9/26 complicated by coronary perforation and development of a cardiac tamponade that was treated with a pigtail drain. When the drain was removed the next day he had reaccumulation of his pericardial effusion and hence required subxyphoid pericardial window on 05/15/17 with 100 cc bloody fluid drained. Drain was removed on 05/17/17 and his systolics maintained in the 120s. On 05/18/17, he was stable. Repeat CBC on 9/30 showed Hb of 8.2. Bedside echocardiogram prior to d/c was negative for pericardial effusion. However patient went home and developed substernal chest pain around 10 pm with radiation to back and he came right back to get evaluated. He took  3 x NTG with partial relief. He came via EMS. In the ED he reported chest pain at 2/10.  No bleeding. History of intolerance to statins. Troponin was 0.34. He was started on IV  heparin by cardiology fellow and admitted for observation.    Hospital Course     Troponin with flat trend and peaked at 0.40. IV heparin was stopped. He was treated with Ibuprofen with improvement in symptoms. No further chest pain. Follow up echo viewed by Dr. Eldridge Dace with small effusion. He was ambulatory without any symptoms.  Joseph Mercado was seen by Dr. Eldridge Dace and determined stable for discharge home. Follow up in the office has been arranged. Medications are listed below.  _____________  Discharge Vitals Blood pressure (!) 155/69, pulse 80, temperature 98.1 F (36.7 C), temperature source Oral, resp. rate 20, height 5\' 11"  (1.803 m), weight 222 lb 1.6 oz (100.7 kg), SpO2 97 %.  Filed Weights   05/20/17 0023 05/21/17 0512  Weight: 225 lb (102.1 kg) 222 lb 1.6 oz (100.7 kg)    Labs & Radiologic Studies    CBC  Recent Labs  05/20/17 0357 05/21/17 1108  WBC 8.8 8.6  HGB 8.2* 8.4*  HCT 25.2* 25.7*  MCV 91.0 91.5  PLT 281 306   Basic Metabolic Panel  Recent Labs  05/20/17 0357 05/21/17 0323  NA 133* 133*  K 3.7 4.7  CL 99* 98*  CO2 27 27  GLUCOSE 106* 98  BUN <5* 9  CREATININE 0.76 0.88  CALCIUM 8.6* 8.9   Liver Function Tests  Recent Labs  05/20/17 0357  AST 23  ALT 17  ALKPHOS 64  BILITOT 1.2  PROT 5.7*  ALBUMIN 2.9*   No results  for input(s): LIPASE, AMYLASE in the last 72 hours. Cardiac Enzymes  Recent Labs  05/20/17 0331 05/20/17 0938 05/20/17 1722  TROPONINI 0.37* 0.40* 0.33*   BNP Invalid input(s): POCBNP D-Dimer No results for input(s): DDIMER in the last 72 hours. Hemoglobin A1C  Recent Labs  05/20/17 0357  HGBA1C 4.7*   Fasting Lipid Panel No results for input(s): CHOL, HDL, LDLCALC, TRIG, CHOLHDL, LDLDIRECT in the last 72 hours. Thyroid Function Tests  Recent Labs  05/20/17 0357  TSH 3.881   _____________  Dg Chest Portable 1 View  Result Date: 05/20/2017 CLINICAL DATA:  Chest pain. Angioplasty last week.  Previous coronary stent placement. EXAM: PORTABLE CHEST 1 VIEW COMPARISON:  05/15/2017 FINDINGS: Lordotic technique is demonstrated. Lungs are adequately inflated without focal airspace consolidation or effusion. Stable cardiomegaly. Minimal degenerate change of the spine. IMPRESSION: No acute cardiopulmonary disease. Stable cardiomegaly. Electronically Signed   By: Elberta Fortis M.D.   On: 05/20/2017 01:21   Dg Chest Port 1 View  Result Date: 05/15/2017 CLINICAL DATA:  Sub xiphoid pericardial window for drainage of hemopericardium. EXAM: PORTABLE CHEST 1 VIEW COMPARISON:  03/10/2017 chest radiograph FINDINGS: Upper limits normal heart size again noted. This is a low volume film with mild bibasilar atelectasis. There is no evidence of pneumothorax. A right IJ central venous catheter sheath is present. No acute bony abnormalities are identified. IMPRESSION: Low volume film with mild bibasilar atelectasis. Electronically Signed   By: Harmon Pier M.D.   On: 05/15/2017 18:14   Disposition   Pt is being discharged home today in good condition.  Follow-up Plans & Appointments    Follow-up Information    Alleen Borne, MD Follow up on 05/28/2017.   Specialty:  Cardiothoracic Surgery Why:  at 1pm for your follow up appt.  Contact information: [address] [place] [address] [phone]        [place], Peter M, MD Follow up on 06/03/2017.   Specialty:  Cardiology Contact information: [address] [place] [address] [phone]          Discharge Instructions    Diet - low sodium heart healthy    Complete by:  As directed    Increase activity slowly    Complete by:  As directed       Discharge Medications     Medication List    TAKE these medications   aspirin 81 MG EC tablet Take 1 tablet (81 mg total) by mouth daily. Resume on 05/20/17.   clopidogrel 75 MG tablet Commonly known as:  PLAVIX Take 1 tablet (75 mg total) by mouth daily.  Resume on 05/20/17.   ezetimibe 10 MG tablet Commonly known as:  ZETIA Take 1 tablet (10 mg total) by mouth daily. What changed:  when to take this   ferrous sulfate 325 (65 FE) MG tablet Take 1 tablet (325 mg total) by mouth 3 (three) times daily with meals.   isosorbide mononitrate 30 MG 24 hr tablet Commonly known as:  IMDUR Take 1 tablet (30 mg total) by mouth daily.   metoprolol tartrate 50 MG tablet Commonly known as:  LOPRESSOR Take 25 mg by mouth 2 (two) times daily.   nitroGLYCERIN 0.4 MG SL tablet Commonly known as:  NITROSTAT Place 0.4 mg under the tongue every 5 (five) minutes as needed for chest pain.   pantoprazole 40 MG tablet Commonly known as:  PROTONIX Take 40 mg by mouth 2 (two) times daily.   PRESERVISION  AREDS PO Take 1 capsule by mouth 2 (two) times daily.         Outstanding Labs/Studies   N/a  Duration of Discharge Encounter   Greater than 30 minutes including physician time.  Signed, Laverda Page NP-C 05/21/2017, 1:51 PM   I have examined the patient and reviewed assessment and plan and discussed with patient.  Agree with above as stated.  Pain improved.  Can use antiinflammatory drugs as needed for chest pain, which was likely from pericarditis.  He has follow up with CT surgery and his stitches will be managed at that time.  Anemia from blood loss during prior admission.  Please see my note from earlier today.    Lance Muss

## 2017-05-21 NOTE — Progress Notes (Signed)
Progress Note  Patient Name: Joseph Mercado Date of Encounter: 05/21/2017  Primary Cardiologist: Gwenlyn Found  Subjective   No further chest pain. Feeling well today. Echo at the bedside.   Inpatient Medications    Scheduled Meds: . aspirin EC  81 mg Oral Daily  . atorvastatin  80 mg Oral q1800  . clopidogrel  75 mg Oral Daily  . ezetimibe  10 mg Oral Daily  . ferrous sulfate  325 mg Oral TID WC  . ibuprofen  400 mg Oral TID  . isosorbide mononitrate  30 mg Oral Daily  . metoprolol tartrate  25 mg Oral BID  . pantoprazole  40 mg Oral BID   Continuous Infusions:  PRN Meds: nitroGLYCERIN   Vital Signs    Vitals:   05/20/17 1715 05/20/17 2031 05/21/17 0035 05/21/17 0512  BP: (!) 146/76 140/81 (!) 133/58 (!) 149/79  Pulse: 89 91 73 85  Resp: (!) 23 18 18 18   Temp:  98.6 F (37 C) 98.4 F (36.9 C) 98.4 F (36.9 C)  TempSrc:  Oral Oral Oral  SpO2: 95% 97% 97% 98%  Weight:    222 lb 1.6 oz (100.7 kg)  Height:        Intake/Output Summary (Last 24 hours) at 05/21/17 1026 Last data filed at 05/21/17 0943  Gross per 24 hour  Intake                0 ml  Output              800 ml  Net             -800 ml   Filed Weights   05/20/17 0023 05/21/17 0512  Weight: 225 lb (102.1 kg) 222 lb 1.6 oz (100.7 kg)    Telemetry    SR with brief intermittent episodes of ST - Personally Reviewed  ECG    N/a - Personally Reviewed  Physical Exam   General: Well developed, well nourished, male appearing in no acute distress. Head: Normocephalic, atraumatic.  Neck: Supple without bruits, JVD. Lungs:  Resp regular and unlabored, CTA. Heart: RRR, S1, S2, no S3, S4, or murmur; no rub. Abdomen: Soft, non-tender, non-distended with normoactive bowel sounds. No hepatomegaly. No rebound/guarding. No obvious abdominal masses. Extremities: No clubbing, cyanosis, edema. Distal pedal pulses are 2+ bilaterally. Neuro: Alert and oriented X 3. Moves all extremities spontaneously. Psych: Normal  affect.  Labs    Chemistry Recent Labs Lab 05/20/17 0132 05/20/17 0357 05/21/17 0323  NA 132* 133* 133*  K 3.5 3.7 4.7  CL 100* 99* 98*  CO2 25 27 27   GLUCOSE 98 106* 98  BUN 6 <5* 9  CREATININE 0.78 0.76 0.88  CALCIUM 8.6* 8.6* 8.9  PROT  --  5.7*  --   ALBUMIN  --  2.9*  --   AST  --  23  --   ALT  --  17  --   ALKPHOS  --  64  --   BILITOT  --  1.2  --   GFRNONAA >60 >60 >60  GFRAA >60 >60 >60  ANIONGAP 7 7 8      Hematology Recent Labs Lab 05/18/17 0916 05/20/17 0132 05/20/17 0357  WBC 10.0 8.6 8.8  RBC 2.76* 2.93* 2.77*  HGB 8.2* 8.7* 8.2*  HCT 25.3* 26.5* 25.2*  MCV 91.7 90.4 91.0  MCH 29.7 29.7 29.6  MCHC 32.4 32.8 32.5  RDW 13.2 13.0 13.0  PLT 225 273 281  Cardiac Enzymes Recent Labs Lab 05/20/17 0132 05/20/17 0331 05/20/17 0938 05/20/17 1722  TROPONINI 0.34* 0.37* 0.40* 0.33*    Recent Labs Lab 05/20/17 0155  TROPIPOC 0.20*     BNP Recent Labs Lab 05/20/17 0357  BNP 514.7*     DDimer No results for input(s): DDIMER in the last 168 hours.    Radiology    Dg Chest Portable 1 View  Result Date: 05/20/2017 CLINICAL DATA:  Chest pain. Angioplasty last week. Previous coronary stent placement. EXAM: PORTABLE CHEST 1 VIEW COMPARISON:  05/15/2017 FINDINGS: Lordotic technique is demonstrated. Lungs are adequately inflated without focal airspace consolidation or effusion. Stable cardiomegaly. Minimal degenerate change of the spine. IMPRESSION: No acute cardiopulmonary disease. Stable cardiomegaly. Electronically Signed   By: Marin Olp M.D.   On: 05/20/2017 01:21    Cardiac Studies   TTE: pending   Patient Profile     71 y.o. male with PMH of CAD with CTO of Lcx, HTN, and recent cath with microperforation and effusion/pericardial effusion who presented back with chest pain.   Assessment & Plan    1. Chest pain: Recently admitted for CTO and developed a microperforation causing pericardial effusion requiring a pericardial  window. Presented back with mild chest pain. Trop with low flat trend in the setting of recent trauma. Briefly on heparin. No further chest pain.  -- echo in process now  2. Anemia: Noted with blood loss at recent admission. Discharged home on Iron.  -- check CBC today  3. HL: on statin therapy  4. HTN: Borderline controlled.   Signed, Reino Bellis, NP  05/21/2017, 10:26 AM  Pager # [phone]   For questions or updates, please contact [place] Please consult [email or web address] for contact info under Cardiology/STEMI. Daytime calls, contact the Day Call APP (6a-8a) or assigned team (Teams A-D) provider (7:30a - 5p). All other daytime calls (7:30-5p), contact the Card Master @ [phone].   Nighttime calls, contact the assigned APP (5p-8p) or MD (6:30p-8p). Overnight calls (8p-6a), contact the on call Fellow @ [phone].  I have examined the patient and reviewed assessment and plan and discussed with patient.  Agree with above as stated.  Doing well today.  I personally reviewed echo.  Effusion looks small.  Will have surgery look at his stitches from pericardial window.  Likely discharge later today.  Larae Grooms

## 2017-05-21 NOTE — Progress Notes (Signed)
  Echocardiogram 2D Echocardiogram has been performed.  Joseph Mercado 05/21/2017, 11:01 AM

## 2017-05-27 ENCOUNTER — Telehealth: Payer: Self-pay

## 2017-05-27 NOTE — Telephone Encounter (Signed)
Received a request from [place] requesting lower cost alternative statin.Dr.Jordan advised patient intolerant to statins.Advised to have patient call office to discuss.Form faxed back to [facility] at fax # [phone].

## 2017-05-28 ENCOUNTER — Ambulatory Visit (INDEPENDENT_AMBULATORY_CARE_PROVIDER_SITE_OTHER): Payer: Self-pay | Admitting: Surgery

## 2017-05-28 VITALS — BP 122/77 | HR 68 | Ht 71.0 in | Wt 223.0 lb

## 2017-05-28 DIAGNOSIS — Z9889 Other specified postprocedural states: Secondary | ICD-10-CM

## 2017-05-29 ENCOUNTER — Other Ambulatory Visit: Payer: Self-pay | Admitting: Student

## 2017-05-29 LAB — ECHO INTRAOPERATIVE TEE
HEIGHTINCHES: 71 in
WEIGHTICAEL: 3597.91 [oz_av]

## 2017-05-30 ENCOUNTER — Encounter: Payer: Self-pay | Admitting: Surgery

## 2017-05-30 NOTE — Progress Notes (Signed)
     HPI: Patient returns for routine postoperative follow-up having undergone subxyphoid pericardial window for drainage of hemopericardium on 05/15/2017. The patient's early postoperative recovery while in the hospital was notable for an uncomplicated postop course. Since hospital discharge the patient reports that he feels well. He has occasional brief episodes of left chest pain which he was having before. He has mild incisional soreness. He denies any shortness of breath.   Current Outpatient Prescriptions  Medication Sig Dispense Refill  . aspirin 81 MG EC tablet Take 1 tablet (81 mg total) by mouth daily. Resume on 05/20/17. 30 tablet 12  . clopidogrel (PLAVIX) 75 MG tablet Take 1 tablet (75 mg total) by mouth daily. Resume on 05/20/17. 90 tablet 3  . ezetimibe (ZETIA) 10 MG tablet Take 1 tablet (10 mg total) by mouth daily. (Patient taking differently: Take 10 mg by mouth every morning. ) 30 tablet 6  . ferrous sulfate 325 (65 FE) MG tablet Take 1 tablet (325 mg total) by mouth 3 (three) times daily with meals. 90 tablet 1  . isosorbide mononitrate (IMDUR) 30 MG 24 hr tablet Take 1 tablet (30 mg total) by mouth daily. 90 tablet 4  . metoprolol tartrate (LOPRESSOR) 50 MG tablet Take 25 mg by mouth 2 (two) times daily.     . Multiple Vitamins-Minerals (PRESERVISION AREDS PO) Take 1 capsule by mouth 2 (two) times daily.    . nitroGLYCERIN (NITROSTAT) 0.4 MG SL tablet Place 0.4 mg under the tongue every 5 (five) minutes as needed for chest pain.     . pantoprazole (PROTONIX) 40 MG tablet Take 40 mg by mouth 2 (two) times daily.      No current facility-administered medications for this visit.     Physical Exam: BP 122/77   Pulse 68   Ht 5\' 11"  (1.803 m)   Wt 223 lb (101.2 kg)   SpO2 97%   BMI 31.10 kg/m  He looks well. Lung exam is clear. Cardiac exam shows a regular rate and rhythm with normal heart sounds. Chest incision is healing well    Diagnostic Tests:  None  today  Impression:  He is recovering well following his surgery for drainage of a hemopericardium with hypotension after failed PCI of a CTO of the LCX with coronary perforation. I told him that he can return to driving but should refrain from heavy lifting of more than 10 lbs for 2 months postop.  Plan:  He will continue to follow up with cardiology and will return to see me if he develops any problems with his incision.   Gaye Pollack, MD [facility] [phone]

## 2017-06-02 NOTE — Progress Notes (Signed)
Cardiology Office Note   Date:  06/03/2017   ID:  Joseph Mercado, DOB Aug 20, 1945, MRN [id number]  PCP:  Abigail Miyamoto, MD  Cardiologist:  Nanetta Batty MD  Chief Complaint  Patient presents with  . Follow-up    Post hospital.  . Coronary Artery Disease      History of Present Illness: Joseph Mercado is a 71 y.o. male who is seen for post hospital follow up. He has a history of HTN and family history of CAD. Apparently had a stress test 2 years ago that was OK. Seen in June by Dr. Allyson Sabal with a several month history of progressive DOE and chest pain on exertion. Echo showed mild LVH with grade 2 diastolic dysfunction. Valves were OK. Myoview study showed inferolateral ischemia. This led to a cardiac cath on 03/10/17 which showed CTO of the LCx with collaterals to a fairly large OM. PCI was attempted but unable to cross completely into the large OM so unable to recanalize the vessel. It was noted that this was a long and difficult procedure with radiation dose of 4 gray. When seen in follow up last month he was still having some intermittent chest pain and after reviewing options decided to proceed with attempt at CTO PCI. This was performed on 05/14/17. We were unable to complete the procedure. We were able to cross the lesion in subintimal tract but never able to reenter the true lumen distally. He had no problems during the procedure but about 4 hours later developed acute hypotension and chest pain. Echo revealed a pericardial effusion. He underwent emergent pericardiocentesis with removal of 500 cc of blood. He stabilized and was observed overnight. No drainage from catheter and Echo showed minimal effusion. The catheter was withdrawn and the patient again developed acute hemodynamic instability. He was taken to the OR and a pericardial window was performed. There was no active bleeding site identified. His post op course was uncomplicated. He returned on 05/20/17 with recurrent chest pain.  Troponin was mildly elevated with flat trend. Symptoms resolved with Ibuprofen. It was felt that symptoms were more related to some pericardial irritation. Echo showed a small effusion. He was started on iron supplement for anemia.  On follow up today he states he is doing OK. He denies any chest pain. Complains of bowel issues with iron therapy. He has some SOB on exertion but similar to prior. Wife notes BP running high now.     Past Medical History:  Diagnosis Date  . Angina pectoris (HCC)   . CAD (coronary artery disease)    a. 02/2017: NSTEMI with cath showing 100% Prox Cx stenosis with 95% Mid Cx stenosis and 45% mid-LAD stenosis. Unsuccessful attempt at crossing an occluded mid nondominant circumflex --> plan for staged PCI in coming weeks  . Chest pain 02/2017  . Dyspnea   . GERD (gastroesophageal reflux disease)   . High cholesterol   . History of gout   . Hypertension   . Macular degeneration   . NSTEMI (non-ST elevated myocardial infarction) (HCC) 02/2017  . Pericardial effusion 04/2017    Past Surgical History:  Procedure Laterality Date  . CATARACT EXTRACTION W/ INTRAOCULAR LENS  IMPLANT, BILATERAL Bilateral   . CORONARY ANGIOPLASTY    . CORONARY BALLOON ANGIOPLASTY N/A 03/10/2017   Procedure: Coronary Balloon Angioplasty;  Surgeon: Runell Gess, MD;  Location: [facility] INVASIVE CV LAB;  Service: Cardiovascular;  Laterality: N/A;  CFX  . CORONARY CTO INTERVENTION N/A 05/14/2017  Procedure: CORONARY CTO INTERVENTION;  Surgeon: Swaziland, Peter M, MD;  Location: [facility] INVASIVE CV LAB;  Service: Cardiovascular;  Laterality: N/A;  . LEFT HEART CATH AND CORONARY ANGIOGRAPHY N/A 03/10/2017   Procedure: Left Heart Cath and Coronary Angiography;  Surgeon: Runell Gess, MD;  Location: [facility] INVASIVE CV LAB;  Service: Cardiovascular;  Laterality: N/A;  . PERICARDIOCENTESIS N/A 05/14/2017   Procedure: PERICARDIOCENTESIS;  Surgeon: Swaziland, Peter M, MD;  Location: [facility] INVASIVE CV LAB;   Service: Cardiovascular;  Laterality: N/A;  . SUBXYPHOID PERICARDIAL WINDOW N/A 05/15/2017   Procedure: SUBXYPHOID PERICARDIAL WINDOW WITH DRAINAGE OF PERICARDIAL EFFUSION;  Surgeon: Alleen Borne, MD;  Location: [facility] OR;  Service: Thoracic;  Laterality: N/A;  . ULTRASOUND GUIDANCE FOR VASCULAR ACCESS  05/14/2017   Procedure: Ultrasound Guidance For Vascular Access;  Surgeon: Swaziland, Peter M, MD;  Location: [facility] INVASIVE CV LAB;  Service: Cardiovascular;;     Current Outpatient Prescriptions  Medication Sig Dispense Refill  . aspirin 81 MG EC tablet Take 1 tablet (81 mg total) by mouth daily. Resume on 05/20/17. 30 tablet 12  . ezetimibe (ZETIA) 10 MG tablet Take 1 tablet (10 mg total) by mouth daily. (Patient taking differently: Take 10 mg by mouth every morning. ) 30 tablet 6  . ferrous sulfate 325 (65 FE) MG tablet Take 1 tablet (325 mg total) by mouth daily with breakfast. 90 tablet 1  . isosorbide mononitrate (IMDUR) 30 MG 24 hr tablet Take 1 tablet (30 mg total) by mouth daily. 90 tablet 4  . metoprolol tartrate (LOPRESSOR) 50 MG tablet Take 25 mg by mouth 2 (two) times daily.     . Multiple Vitamins-Minerals (PRESERVISION AREDS PO) Take 1 capsule by mouth 2 (two) times daily.    . nitroGLYCERIN (NITROSTAT) 0.4 MG SL tablet Place 0.4 mg under the tongue every 5 (five) minutes as needed for chest pain.     . pantoprazole (PROTONIX) 40 MG tablet Take 40 mg by mouth 2 (two) times daily.     [name] lisinopril (PRINIVIL,ZESTRIL) 20 MG tablet Take 1 tablet (20 mg total) by mouth daily. 90 tablet 3   No current facility-administered medications for this visit.     Allergies:   Statins    Social History:  The patient  reports that he has quit smoking. His smoking use included Cigarettes. He has a 60.00 pack-year smoking history. He has quit using smokeless tobacco. His smokeless tobacco use included Chew. He reports that he drinks about 42.0 oz of alcohol per week . He reports that he does not use  drugs.   Family History:  The patient's family history includes CAD in his brother.    ROS:  Please see the history of present illness.   Otherwise, review of systems are positive for none.   All other systems are reviewed and negative.    PHYSICAL EXAM: VS:  BP (!) 144/90   Pulse 78   Ht 5\' 11"  (1.803 m)   Wt 224 lb (101.6 kg)   BMI 31.24 kg/m  , BMI Body mass index is 31.24 kg/m. GEN: Well nourished, well developed, in no acute distress  HEENT: normal  Neck: no JVD, carotid bruits, or masses Cardiac: RRR; no murmurs, rubs, or gallops,no edema Subxiphoid incision is healing well. Respiratory:  clear to auscultation bilaterally, normal work of breathing. GI: soft, nontender, nondistended, + BS Ext;  Radial and pedal pulses  2+  Skin: warm and dry, no rash Neuro:  Strength and sensation are intact  Psych: euthymic mood, full affect   EKG:  EKG is not ordered today. The ekg ordered today demonstrates N/A   Recent Labs: 05/20/2017: ALT 17; B Natriuretic Peptide 514.7; TSH 3.881 05/21/2017: BUN 9; Creatinine, Ser 0.88; Hemoglobin 8.4; Platelets 306; Potassium 4.7; Sodium 133    Lipid Panel    Component Value Date/Time   CHOL 148 03/11/2017 0214   TRIG 72 03/11/2017 0214   HDL 45 03/11/2017 0214   CHOLHDL 3.3 03/11/2017 0214   VLDL 14 03/11/2017 0214   LDLCALC 89 03/11/2017 0214      Wt Readings from Last 3 Encounters:  06/03/17 224 lb (101.6 kg)  05/28/17 223 lb (101.2 kg)  05/21/17 222 lb 1.6 oz (100.7 kg)      Other studies Reviewed: Cardiac cath 03/10/17: CARDIAC CATHETERIZATION / PCI LCX    History obtained from chart review.Joseph Mercado is a very pleasant 71 year old moderately overweight divorced Caucasian male father of one child who is accompanied by his friend Olegario Messier today. He was referred by Dr. Marina Goodell in [facility] for cardiovascular evaluation because of chest pain and shortness of breath. His risk factors include family history a brother who had bypass  surgery age 33 and treated hypertension. He quit smoking 35 years ago. He is retired Music therapist. He did have a stress test 2 years ago which apparently was normal. Over the last 2 years she had progressive dyspnea and exertional chest pain as well as nocturnal chest pain. He was admitted in transfer from [facility] with chest pain. His troponins increased to 2. He presents now for cardiac catheterization to define his anatomy.   PROCEDURE DESCRIPTION:   The patient was brought to the second floor [facility] cath lab in the postabsorptive state. He was  premedicated with Valium 5 mg by mouth, IV Versed and fentanyl. His right wrist was prepped and shaved in usual sterile fashion. Xylocaine 1% was used for local anesthesia. A 6 French sheath was inserted into the right radial  artery using standard Seldinger technique. The patient received 5000 units  of heparin  intravenously.  A 5 [place] TIG catheter and pigtail catheter were used for selective coronary angiography and left ventriculography respectively. Isovue dye was used for the entirety of the case. Retrograde aortic and left ventricular and pullback pressures were recorded. Radial cocktail was administered via the SideArm sheath.  The patient received hydration and 80 mg Brilenta by mouth followed by Angiomax bolus and infusion with a therapeutic ACT demonstrated. Using a 6 [place] XB 3.5 cm guide catheter along with multiple guidewires including a 014 per water, Fielder XT and a Fighter guidewire I was able to get across the mid AV groove circumflex into the second moderate size marginal branch and angioplasty of the occlusion with a 2 mm x 12 mm balloon. Unfortunately I was unable to redirect the wire and the continuation of the AV groove circumflex in the to the larger third marginal branch. This was collateralized however. The excessive amount of contrast and x-ray dose utilized during the case I aborted the case and was staged  intervention tomorrow. Angiomax was turned off. The sheath was removed and a TR band was placed on the wrist to achieve hemostasis. The patient left the lab in stable condition.    IMPRESSION: Unsuccessful attempt at crossing an occluded mid nondominant circumflex into the AV groove continuation of the posterior lateral branch. Patient does have normal LV function. He was pain-free at the end of the case. He was  loaded with aspirin and Brilenta. We will restart heparin without a bolus 4 hours after sheath removal. The plan will be to reattempt circumflex re-intervention tomorrow. The Air Kerma  was 4014 mgr and the fluoroscopytime was 39.6 minutes. Total contrast administered and patient was 190 mL. The ACT was 560.  Nanetta Batty. MD, [facility] 03/10/2017 4:57 PM  Echo 02/26/17: Study Conclusions  - Left ventricle: There was moderate concentric hypertrophy.   Systolic function was normal. The estimated ejection fraction was   in the range of 55% to 60%. Features are consistent with a   pseudonormal left ventricular filling pattern, with concomitant   abnormal relaxation and increased filling pressure (grade 2   diastolic dysfunction). Doppler parameters are consistent with   high ventricular filling pressure. - Left atrium: The atrium was mildly dilated. - Right ventricle: The cavity size was normal. Wall thickness was   normal. Systolic function was normal. - Tricuspid valve: There was trivial regurgitation. - Pulmonary arteries: Systolic pressure was within the normal   range. PA peak pressure: 27 mm Hg (S). - Pericardium, extracardiac: A trivial pericardial effusion was   identified. - Global longitudinal strain -18.3%.  Myoview 02/26/17: Study Highlights    Inferior (basal) and inferolateral (base, mid) thinning with mild improvement in recovery Does not appear to be significant for ischemia probably reflects soft tissue attenuation (attenuation)  Nuclear stress EF: 61%.  Low  risk scan   Procedures   CORONARY CTO INTERVENTION  Conclusion     Mid LAD lesion, 45 %stenosed.  Mid Cx lesion, 100 %stenosed.  Post intervention, there is a 100% residual stenosis.  Prox Cx to Mid Cx lesion, 95 %stenosed.  Post intervention, there is a 30% residual stenosis.   1. Unsuccessful CTO PCI of the LCx. There is some improvement of flow into the small OM with PTCA.    Plan: continue medical management. Will follow and see how he does with medical therapy.    Echo 05/21/17: Study Conclusions  - Left ventricle: The cavity size was normal. Wall thickness was   increased in a pattern of mild LVH. Systolic function was normal.   The estimated ejection fraction was in the range of 60% to 65%.   Although no diagnostic regional wall motion abnormality was   identified, this possibility cannot be completely excluded on the   basis of this study. Features are consistent with a pseudonormal   left ventricular filling pattern, with concomitant abnormal   relaxation and increased filling pressure (grade 2 diastolic   dysfunction). - Aortic valve: There was no stenosis. - Mitral valve: There was mild regurgitation. - Right ventricle: The cavity size was normal. Systolic function   was normal. - Pulmonary arteries: No complete TR doppler jet so unable to   estimate PA systolic pressure. - Systemic veins: IVC not visualized. - Pericardium, extracardiac: There was no pericardial effusion.  Impressions:  - Normal LV size with mild LV hypertrophy. EF 60-65%. Moderate   diastolic dysfunction. Normal RV size and systolic function. Mild   MR.  ASSESSMENT AND PLAN:  1.  CAD with CTO of the LCx supplying one large OM branch. S/p failed attempt at CTO PCI with complication of acute pericardial tamponade requiring a pericardial window. He is recovering well now. May resume aerobic activity. Will continue metoprolol and Imdur. May stop Plavix and take ASA only. He will not  be a candidate from another attempt at PCI of this vessel.  2. HTN BP now elevated. Will resume  lisinopril 20 mg daily.    3. HLD intolerant of statins. On Zetia. LDL 89.   4. Acute blood loss anemia. Will reduce iron supplement to once a day. Repeat CBC in one month. Once Hgb recovered will stop iron.   Current medicines are reviewed at length with the patient today.  The patient does not have concerns regarding medicines.  The following changes have been made:  See above   Signed, Peter Swaziland, MD  06/03/2017 1:26 PM    [facility] [address] [place], [place], [address] Phone [phone], Fax [phone]

## 2017-06-03 ENCOUNTER — Encounter: Payer: Self-pay | Admitting: Cardiology

## 2017-06-03 ENCOUNTER — Ambulatory Visit (INDEPENDENT_AMBULATORY_CARE_PROVIDER_SITE_OTHER): Payer: Medicare Other | Admitting: Cardiology

## 2017-06-03 VITALS — BP 144/90 | HR 78 | Ht 71.0 in | Wt 224.0 lb

## 2017-06-03 DIAGNOSIS — I209 Angina pectoris, unspecified: Secondary | ICD-10-CM | POA: Diagnosis not present

## 2017-06-03 DIAGNOSIS — I10 Essential (primary) hypertension: Secondary | ICD-10-CM

## 2017-06-03 DIAGNOSIS — I200 Unstable angina: Secondary | ICD-10-CM | POA: Diagnosis not present

## 2017-06-03 DIAGNOSIS — I25110 Atherosclerotic heart disease of native coronary artery with unstable angina pectoris: Secondary | ICD-10-CM

## 2017-06-03 DIAGNOSIS — I208 Other forms of angina pectoris: Secondary | ICD-10-CM

## 2017-06-03 MED ORDER — LISINOPRIL 20 MG PO TABS: 20.0000 mg | ORAL_TABLET | Freq: Every day | ORAL | 3 refills | Status: DC

## 2017-06-03 MED ORDER — FERROUS SULFATE 325 (65 FE) MG PO TABS: 325.0000 mg | ORAL_TABLET | Freq: Every day | ORAL | 1 refills | Status: DC

## 2017-06-03 NOTE — Patient Instructions (Signed)
Stop Plavix  Reduce iron to once a day  Resume lisinopril 20 mg daily  I will see you in one month with lab work.

## 2017-07-15 DIAGNOSIS — I25110 Atherosclerotic heart disease of native coronary artery with unstable angina pectoris: Secondary | ICD-10-CM | POA: Diagnosis not present

## 2017-07-15 DIAGNOSIS — I208 Other forms of angina pectoris: Secondary | ICD-10-CM | POA: Diagnosis not present

## 2017-07-15 DIAGNOSIS — I10 Essential (primary) hypertension: Secondary | ICD-10-CM | POA: Diagnosis not present

## 2017-07-15 LAB — CBC WITH DIFFERENTIAL/PLATELET
Basophils Absolute: 0.1 10*3/uL (ref 0.0–0.2)
Basos: 1 %
EOS (ABSOLUTE): 0.3 10*3/uL (ref 0.0–0.4)
EOS: 3 %
HEMATOCRIT: 43.6 % (ref 37.5–51.0)
HEMOGLOBIN: 14.1 g/dL (ref 13.0–17.7)
IMMATURE GRANULOCYTES: 0 %
Immature Grans (Abs): 0 10*3/uL (ref 0.0–0.1)
Lymphocytes Absolute: 1.8 10*3/uL (ref 0.7–3.1)
Lymphs: 19 %
MCH: 29 pg (ref 26.6–33.0)
MCHC: 32.3 g/dL (ref 31.5–35.7)
MCV: 90 fL (ref 79–97)
MONOCYTES: 10 %
Monocytes Absolute: 0.9 10*3/uL (ref 0.1–0.9)
NEUTROS PCT: 67 %
Neutrophils Absolute: 6.3 10*3/uL (ref 1.4–7.0)
Platelets: 216 10*3/uL (ref 150–379)
RBC: 4.87 x10E6/uL (ref 4.14–5.80)
RDW: 13.9 % (ref 12.3–15.4)
WBC: 9.4 10*3/uL (ref 3.4–10.8)

## 2017-07-15 NOTE — Progress Notes (Signed)
Cardiology Office Note   Date:  07/18/2017   ID:  Joseph Mercado, DOB [birth year], MRN [id number]  PCP:  Abigail Miyamoto, MD  Cardiologist:  Nanetta Batty MD  Chief Complaint  Patient presents with  . Coronary Artery Disease      History of Present Illness: Joseph Mercado is a 71 y.o. male who is seen for follow up CAD. He has a history of HTN and family history of CAD. Apparently had a stress test 2 years ago that was OK. Seen in June by Dr. Allyson Sabal with a several month history of progressive DOE and chest pain on exertion. Echo showed mild LVH with grade 2 diastolic dysfunction. Valves were OK. Myoview study showed inferolateral ischemia. This led to a cardiac cath on 03/10/17 which showed CTO of the LCx with collaterals to a fairly large OM. PCI was attempted but unable to cross completely into the large OM so unable to recanalize the vessel. It was noted that this was a long and difficult procedure with radiation dose of 4 gray. When seen in follow up last month he was still having some intermittent chest pain and after reviewing options decided to proceed with attempt at CTO PCI. This was performed on 05/14/17. We were unable to complete the procedure. We were able to cross the lesion in subintimal tract but never able to reenter the true lumen distally. He had no problems during the procedure but about 4 hours later developed acute hypotension and chest pain. Echo revealed a pericardial effusion. He underwent emergent pericardiocentesis with removal of 500 cc of blood. He stabilized and was observed overnight. No drainage from catheter and Echo showed minimal effusion. The catheter was withdrawn and the patient again developed acute hemodynamic instability. He was taken to the OR and a pericardial window was performed. There was no active bleeding site identified. His post op course was uncomplicated. He returned on 05/20/17 with recurrent chest pain. Troponin was mildly elevated with flat  trend. Symptoms resolved with Ibuprofen. It was felt that symptoms were more related to some pericardial irritation. Echo showed a small effusion. He was started on iron supplement for anemia.  On follow up today he states he is doing well. He only notes angina when he really pushes it or tries to do something fast.  Wife notes BP in am is normal but it runs high in the evening up to 170-180 systolic. He does complain of ED.     Past Medical History:  Diagnosis Date  . Angina pectoris (HCC)   . CAD (coronary artery disease)    a. 02/2017: NSTEMI with cath showing 100% Prox Cx stenosis with 95% Mid Cx stenosis and 45% mid-LAD stenosis. Unsuccessful attempt at crossing an occluded mid nondominant circumflex --> plan for staged PCI in coming weeks  . Chest pain 02/2017  . Dyspnea   . GERD (gastroesophageal reflux disease)   . High cholesterol   . History of gout   . Hypertension   . Macular degeneration   . NSTEMI (non-ST elevated myocardial infarction) (HCC) 02/2017  . Pericardial effusion 04/2017    Past Surgical History:  Procedure Laterality Date  . CATARACT EXTRACTION W/ INTRAOCULAR LENS  IMPLANT, BILATERAL Bilateral   . CORONARY ANGIOPLASTY    . CORONARY BALLOON ANGIOPLASTY N/A 03/10/2017   Procedure: Coronary Balloon Angioplasty;  Surgeon: Runell Gess, MD;  Location[facility] INVASIVE CV LAB;  Service: Cardiovascular;  Laterality: N/A;  CFX  . CORONARY CTO INTERVENTION N/A 05/14/2017  Procedure: CORONARY CTO INTERVENTION;  Surgeon: [place], Analaya Hoey M, MD;  Location: [facility] INVASIVE CV LAB;  Service: Cardiovascular;  Laterality: N/A;  . LEFT HEART CATH AND CORONARY ANGIOGRAPHY N/A 03/10/2017   Procedure: Left Heart Cath and Coronary Angiography;  Surgeon: Runell Gess, MD;  Location: [facility] INVASIVE CV LAB;  Service: Cardiovascular;  Laterality: N/A;  . PERICARDIOCENTESIS N/A 05/14/2017   Procedure: PERICARDIOCENTESIS;  Surgeon: [place], Wallis Vancott M, MD;  Location: [facility] INVASIVE CV LAB;  Service:  Cardiovascular;  Laterality: N/A;  . SUBXYPHOID PERICARDIAL WINDOW N/A 05/15/2017   Procedure: SUBXYPHOID PERICARDIAL WINDOW WITH DRAINAGE OF PERICARDIAL EFFUSION;  Surgeon: Alleen Borne, MD;  Location: [facility] OR;  Service: Thoracic;  Laterality: N/A;  . ULTRASOUND GUIDANCE FOR VASCULAR ACCESS  05/14/2017   Procedure: Ultrasound Guidance For Vascular Access;  Surgeon: [place], Andrey Mccaskill M, MD;  Location: [facility] INVASIVE CV LAB;  Service: Cardiovascular;;     Current Outpatient Medications  Medication Sig Dispense Refill  . aspirin 81 MG EC tablet Take 1 tablet (81 mg total) by mouth daily. Resume on 05/20/17. 30 tablet 12  . ezetimibe (ZETIA) 10 MG tablet Take 1 tablet (10 mg total) by mouth daily. 30 tablet 6  . isosorbide mononitrate (IMDUR) 30 MG 24 hr tablet Take 1 tablet (30 mg total) by mouth daily. 90 tablet 4  . lisinopril (PRINIVIL,ZESTRIL) 20 MG tablet Take 1 tablet (20 mg total) by mouth daily. 90 tablet 3  . Multiple Vitamins-Minerals (PRESERVISION AREDS PO) Take 1 capsule by mouth 2 (two) times daily.    . nitroGLYCERIN (NITROSTAT) 0.4 MG SL tablet Place 0.4 mg under the tongue every 5 (five) minutes as needed for chest pain.     . pantoprazole (PROTONIX) 40 MG tablet Take 40 mg by mouth 2 (two) times daily.     . metoprolol succinate (TOPROL-XL) 50 MG 24 hr tablet Take 1 tablet (50 mg total) by mouth daily. Take with or immediately following a meal. 90 tablet 3   No current facility-administered medications for this visit.     Allergies:   Statins    Social History:  The patient  reports that he has quit smoking. His smoking use included cigarettes. He has a 60.00 pack-year smoking history. He has quit using smokeless tobacco. His smokeless tobacco use included chew. He reports that he drinks about 42.0 oz of alcohol per week. He reports that he does not use drugs.   Family History:  The patient's family history includes CAD in his brother.    ROS:  Please see the history of present  illness.   Otherwise, review of systems are positive for none.   All other systems are reviewed and negative.    PHYSICAL EXAM: VS:  BP 120/76   Pulse 60   Ht 5\' 11"  (1.803 m)   Wt 229 lb (103.9 kg)   SpO2 95%   BMI 31.94 kg/m  , BMI Body mass index is 31.94 kg/m. GENERAL:  Well appearing WM in NAD HEENT:  PERRL, EOMI, sclera are clear. Oropharynx is clear. NECK:  No jugular venous distention, carotid upstroke brisk and symmetric, no bruits, no thyromegaly or adenopathy LUNGS:  Clear to auscultation bilaterally CHEST:  Unremarkable HEART:  RRR,  PMI not displaced or sustained,S1 and S2 within normal limits, no S3, no S4: no clicks, no rubs, no murmurs ABD:  Soft, nontender. BS +, no masses or bruits. No hepatomegaly, no splenomegaly EXT:  2 + pulses throughout, no edema, no cyanosis no clubbing SKIN:  Warm and dry.  No rashes NEURO:  Alert and oriented x 3. Cranial nerves II through XII intact. PSYCH:  Cognitively intact     EKG:  EKG is not ordered today. The ekg ordered today demonstrates N/A   Recent Labs: 05/20/2017: ALT 17; B Natriuretic Peptide 514.7; TSH 3.881 05/21/2017: BUN 9; Creatinine, Ser 0.88; Potassium 4.7; Sodium 133 07/15/2017: Hemoglobin 14.1; Platelets 216    Lipid Panel    Component Value Date/Time   CHOL 148 03/11/2017 0214   TRIG 72 03/11/2017 0214   HDL 45 03/11/2017 0214   CHOLHDL 3.3 03/11/2017 0214   VLDL 14 03/11/2017 0214   LDLCALC 89 03/11/2017 0214      Wt Readings from Last 3 Encounters:  07/18/17 229 lb (103.9 kg)  06/03/17 224 lb (101.6 kg)  05/28/17 223 lb (101.2 kg)      Other studies Reviewed: Cardiac cath 03/10/17: CARDIAC CATHETERIZATION / PCI LCX    History obtained from chart review.Mr. Seay is a very pleasant 71 year old moderately overweight divorced Caucasian male father of one child who is accompanied by his friend Olegario Messier today. He was referred by Dr. Marina Goodell in [facility] for cardiovascular evaluation because of  chest pain and shortness of breath. His risk factors include family history a brother who had bypass surgery age 49 and treated hypertension. He quit smoking 35 years ago. He is retired Music therapist. He did have a stress test 2 years ago which apparently was normal. Over the last 2 years she had progressive dyspnea and exertional chest pain as well as nocturnal chest pain. He was admitted in transfer from [facility] with chest pain. His troponins increased to 2. He presents now for cardiac catheterization to define his anatomy.   PROCEDURE DESCRIPTION:   The patient was brought to the second floor [facility] cath lab in the postabsorptive state. He was  premedicated with Valium 5 mg by mouth, IV Versed and fentanyl. His right wrist was prepped and shaved in usual sterile fashion. Xylocaine 1% was used for local anesthesia. A 6 French sheath was inserted into the right radial  artery using standard Seldinger technique. The patient received 5000 units  of heparin  intravenously.  A 5 [place] TIG catheter and pigtail catheter were used for selective coronary angiography and left ventriculography respectively. Isovue dye was used for the entirety of the case. Retrograde aortic and left ventricular and pullback pressures were recorded. Radial cocktail was administered via the SideArm sheath.  The patient received hydration and 80 mg Brilenta by mouth followed by Angiomax bolus and infusion with a therapeutic ACT demonstrated. Using a 6 [place] XB 3.5 cm guide catheter along with multiple guidewires including a 014 per water, Fielder XT and a Fighter guidewire I was able to get across the mid AV groove circumflex into the second moderate size marginal branch and angioplasty of the occlusion with a 2 mm x 12 mm balloon. Unfortunately I was unable to redirect the wire and the continuation of the AV groove circumflex in the to the larger third marginal branch. This was collateralized however. The  excessive amount of contrast and x-ray dose utilized during the case I aborted the case and was staged intervention tomorrow. Angiomax was turned off. The sheath was removed and a TR band was placed on the wrist to achieve hemostasis. The patient left the lab in stable condition.    IMPRESSION: Unsuccessful attempt at crossing an occluded mid nondominant circumflex into the AV groove continuation of the posterior  lateral branch. Patient does have normal LV function. He was pain-free at the end of the case. He was loaded with aspirin and Brilenta. We will restart heparin without a bolus 4 hours after sheath removal. The plan will be to reattempt circumflex re-intervention tomorrow. The Air Kerma  was 4014 mgr and the fluoroscopytime was 39.6 minutes. Total contrast administered and patient was 190 mL. The ACT was 560.  Nanetta Batty. MD, [facility] 03/10/2017 4:57 PM  Echo 02/26/17: Study Conclusions  - Left ventricle: There was moderate concentric hypertrophy.   Systolic function was normal. The estimated ejection fraction was   in the range of 55% to 60%. Features are consistent with a   pseudonormal left ventricular filling pattern, with concomitant   abnormal relaxation and increased filling pressure (grade 2   diastolic dysfunction). Doppler parameters are consistent with   high ventricular filling pressure. - Left atrium: The atrium was mildly dilated. - Right ventricle: The cavity size was normal. Wall thickness was   normal. Systolic function was normal. - Tricuspid valve: There was trivial regurgitation. - Pulmonary arteries: Systolic pressure was within the normal   range. PA peak pressure: 27 mm Hg (S). - Pericardium, extracardiac: A trivial pericardial effusion was   identified. - Global longitudinal strain -18.3%.  Myoview 02/26/17: Study Highlights    Inferior (basal) and inferolateral (base, mid) thinning with mild improvement in recovery Does not appear to be significant  for ischemia probably reflects soft tissue attenuation (attenuation)  Nuclear stress EF: 61%.  Low risk scan   Procedures   CORONARY CTO INTERVENTION  Conclusion     Mid LAD lesion, 45 %stenosed.  Mid Cx lesion, 100 %stenosed.  Post intervention, there is a 100% residual stenosis.  Prox Cx to Mid Cx lesion, 95 %stenosed.  Post intervention, there is a 30% residual stenosis.   1. Unsuccessful CTO PCI of the LCx. There is some improvement of flow into the small OM with PTCA.    Plan: continue medical management. Will follow and see how he does with medical therapy.    Echo 05/21/17: Study Conclusions  - Left ventricle: The cavity size was normal. Wall thickness was   increased in a pattern of mild LVH. Systolic function was normal.   The estimated ejection fraction was in the range of 60% to 65%.   Although no diagnostic regional wall motion abnormality was   identified, this possibility cannot be completely excluded on the   basis of this study. Features are consistent with a pseudonormal   left ventricular filling pattern, with concomitant abnormal   relaxation and increased filling pressure (grade 2 diastolic   dysfunction). - Aortic valve: There was no stenosis. - Mitral valve: There was mild regurgitation. - Right ventricle: The cavity size was normal. Systolic function   was normal. - Pulmonary arteries: No complete TR doppler jet so unable to   estimate PA systolic pressure. - Systemic veins: IVC not visualized. - Pericardium, extracardiac: There was no pericardial effusion.  Impressions:  - Normal LV size with mild LV hypertrophy. EF 60-65%. Moderate   diastolic dysfunction. Normal RV size and systolic function. Mild   MR.  ASSESSMENT AND PLAN:  1.  CAD with CTO of the LCx supplying one large OM branch. S/p failed attempt at CTO PCI with complication of acute pericardial tamponade requiring a pericardial window. He has recovered completely. He has  stable class 2 angina.  Will continue metoprolol and Imdur. Continue ASA only. He will not be a  candidate from another attempt at PCI of this vessel.  2. HTN BP normal today but he has elevated readings in the PM. Will switch metoprolol to Toprol XL 50 mg and have him take this in the afternoon. Continue lisinopril and Imdur in the morning.   3. HLD intolerant of statins. On Zetia. LDL 89.   4. Acute blood loss anemia. Resolved. Hgb now normal. Iron supplement discontinued.  5. ED - not a candidate for Viagra, Cialis, levitra due to chronic nitrate use.    Current medicines are reviewed at length with the patient today.  The patient does not have concerns regarding medicines.  The following changes have been made:  See above   Signed, Xerxes Agrusa [place], MD  07/18/2017 10:21 AM    [facility] [address] [place], [place], [address] Phone [phone], Fax [phone]

## 2017-07-18 ENCOUNTER — Encounter: Payer: Self-pay | Admitting: Cardiology

## 2017-07-18 ENCOUNTER — Ambulatory Visit (INDEPENDENT_AMBULATORY_CARE_PROVIDER_SITE_OTHER): Payer: Medicare Other | Admitting: Cardiology

## 2017-07-18 VITALS — BP 120/76 | HR 60 | Ht 71.0 in | Wt 229.0 lb

## 2017-07-18 DIAGNOSIS — I10 Essential (primary) hypertension: Secondary | ICD-10-CM

## 2017-07-18 DIAGNOSIS — I209 Angina pectoris, unspecified: Secondary | ICD-10-CM

## 2017-07-18 DIAGNOSIS — I208 Other forms of angina pectoris: Secondary | ICD-10-CM

## 2017-07-18 DIAGNOSIS — E785 Hyperlipidemia, unspecified: Secondary | ICD-10-CM | POA: Diagnosis not present

## 2017-07-18 MED ORDER — METOPROLOL SUCCINATE ER 50 MG PO TB24: 50.0000 mg | ORAL_TABLET | Freq: Every day | ORAL | 3 refills | Status: DC

## 2017-07-18 NOTE — Patient Instructions (Signed)
We will switch metoprolol to Toprol XL 50 mg - take once a day in the afternoon  Continue your other therapy  I will see you in 6 months.

## 2017-07-30 DIAGNOSIS — I2510 Atherosclerotic heart disease of native coronary artery without angina pectoris: Secondary | ICD-10-CM | POA: Diagnosis not present

## 2017-07-30 DIAGNOSIS — E782 Mixed hyperlipidemia: Secondary | ICD-10-CM | POA: Diagnosis not present

## 2017-07-30 DIAGNOSIS — J449 Chronic obstructive pulmonary disease, unspecified: Secondary | ICD-10-CM | POA: Diagnosis not present

## 2017-07-30 DIAGNOSIS — K219 Gastro-esophageal reflux disease without esophagitis: Secondary | ICD-10-CM | POA: Diagnosis not present

## 2017-07-30 DIAGNOSIS — I10 Essential (primary) hypertension: Secondary | ICD-10-CM | POA: Diagnosis not present

## 2017-07-30 DIAGNOSIS — M1A00X0 Idiopathic chronic gout, unspecified site, without tophus (tophi): Secondary | ICD-10-CM | POA: Diagnosis not present

## 2017-07-31 DIAGNOSIS — L820 Inflamed seborrheic keratosis: Secondary | ICD-10-CM | POA: Diagnosis not present

## 2017-07-31 DIAGNOSIS — D0439 Carcinoma in situ of skin of other parts of face: Secondary | ICD-10-CM | POA: Diagnosis not present

## 2017-10-07 ENCOUNTER — Other Ambulatory Visit: Payer: Self-pay | Admitting: *Deleted

## 2017-10-07 MED ORDER — EZETIMIBE 10 MG PO TABS: 10.0000 mg | ORAL_TABLET | Freq: Every day | ORAL | 9 refills | Status: DC

## 2017-10-07 NOTE — Telephone Encounter (Signed)
Rx(s) sent to pharmacy electronically.  

## 2017-10-08 IMAGING — NM NM MISC PROCEDURE
9 series · 54 of 54 positions shown · non-contrast
Comparison: none

[Series 1: wbr rest · 6.40mm/px · 6 of 64 frames shown]
[frame 6/64]
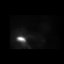
[frame 16/64]
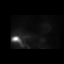
[frame 27/64]
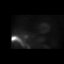
[frame 38/64]
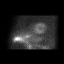
[frame 48/64]
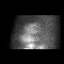
[frame 59/64]
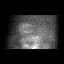

[Series 1: wbr_r-proj_st wbr rest · 6.40mm/px · 6 of 64 frames shown]
[frame 6/64]
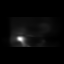
[frame 16/64]
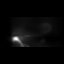
[frame 27/64]
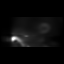
[frame 38/64]
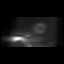
[frame 48/64]
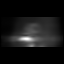
[frame 59/64]
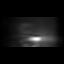

[Series 1: rest sax · 6.4mm · 6.40mm/px · 6 of 23 frames shown]
[frame 2/23]
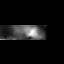
[frame 6/23]
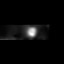
[frame 10/23]
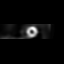
[frame 14/23]
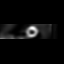
[frame 18/23]
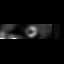
[frame 22/23]
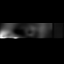

[Series 2: wbr stress-gsp · 6.40mm/px · 6 of 511 frames shown]
[frame 43/511]
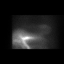
[frame 128/511]
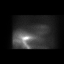
[frame 213/511]
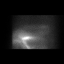
[frame 298/511]
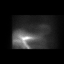
[frame 383/511]
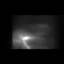
[frame 469/511]
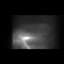

[Series 2: stress sax · 6.4mm · 6.40mm/px · 6 of 25 frames shown]
[frame 3/25]
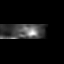
[frame 7/25]
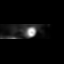
[frame 11/25]
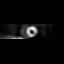
[frame 15/25]
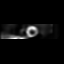
[frame 19/25]
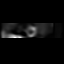
[frame 23/25]
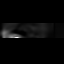

[Series 2: stress sax gs · 6.4mm · 6.40mm/px · 6 of 200 frames shown]
[frame 17/200]
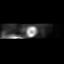
[frame 50/200]
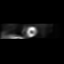
[frame 84/200]
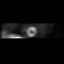
[frame 117/200]
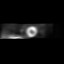
[frame 150/200]
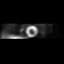
[frame 184/200]
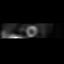

[Series 2: wbr_s-proj_st wbr stress-gsp · 6.40mm/px · 6 of 512 frames shown]
[frame 43/512]
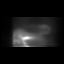
[frame 128/512]
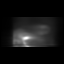
[frame 214/512]
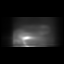
[frame 299/512]
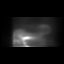
[frame 384/512]
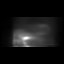
[frame 470/512]
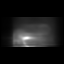

[Series 3: wbr stress-sum-em · 6.40mm/px · 6 of 64 frames shown]
[frame 6/64]
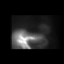
[frame 16/64]
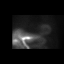
[frame 27/64]
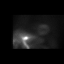
[frame 38/64]
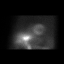
[frame 48/64]
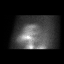
[frame 59/64]
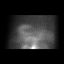

[Series 3: wbr_s-proj_st wbr stress-sum-em · 6.40mm/px · 6 of 64 frames shown]
[frame 6/64]
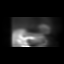
[frame 16/64]
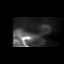
[frame 27/64]
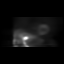
[frame 38/64]
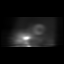
[frame 48/64]
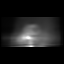
[frame 59/64]
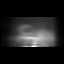

[54 of 54 positions shown; findings below may reference images not displayed]

Canned report from images found in remote index.

Refer to host system for actual result text.

## 2017-12-01 DIAGNOSIS — H353113 Nonexudative age-related macular degeneration, right eye, advanced atrophic without subfoveal involvement: Secondary | ICD-10-CM | POA: Diagnosis not present

## 2017-12-01 DIAGNOSIS — J449 Chronic obstructive pulmonary disease, unspecified: Secondary | ICD-10-CM | POA: Diagnosis not present

## 2017-12-01 DIAGNOSIS — M1A00X0 Idiopathic chronic gout, unspecified site, without tophus (tophi): Secondary | ICD-10-CM | POA: Diagnosis not present

## 2017-12-01 DIAGNOSIS — E782 Mixed hyperlipidemia: Secondary | ICD-10-CM | POA: Diagnosis not present

## 2017-12-01 DIAGNOSIS — K219 Gastro-esophageal reflux disease without esophagitis: Secondary | ICD-10-CM | POA: Diagnosis not present

## 2017-12-01 DIAGNOSIS — I10 Essential (primary) hypertension: Secondary | ICD-10-CM | POA: Diagnosis not present

## 2017-12-01 DIAGNOSIS — E291 Testicular hypofunction: Secondary | ICD-10-CM | POA: Diagnosis not present

## 2017-12-01 DIAGNOSIS — Z6832 Body mass index (BMI) 32.0-32.9, adult: Secondary | ICD-10-CM | POA: Diagnosis not present

## 2017-12-10 DIAGNOSIS — E875 Hyperkalemia: Secondary | ICD-10-CM | POA: Diagnosis not present

## 2017-12-11 DIAGNOSIS — H353132 Nonexudative age-related macular degeneration, bilateral, intermediate dry stage: Secondary | ICD-10-CM | POA: Diagnosis not present

## 2017-12-30 IMAGING — DX DG CHEST 1V PORT
1 series · 1 of 1 positions shown · non-contrast
Comparison: 05/15/2017

CLINICAL DATA: Chest pain. Angioplasty last week. Previous coronary
stent placement.

EXAM:
PORTABLE CHEST 1 VIEW

[chest ap]
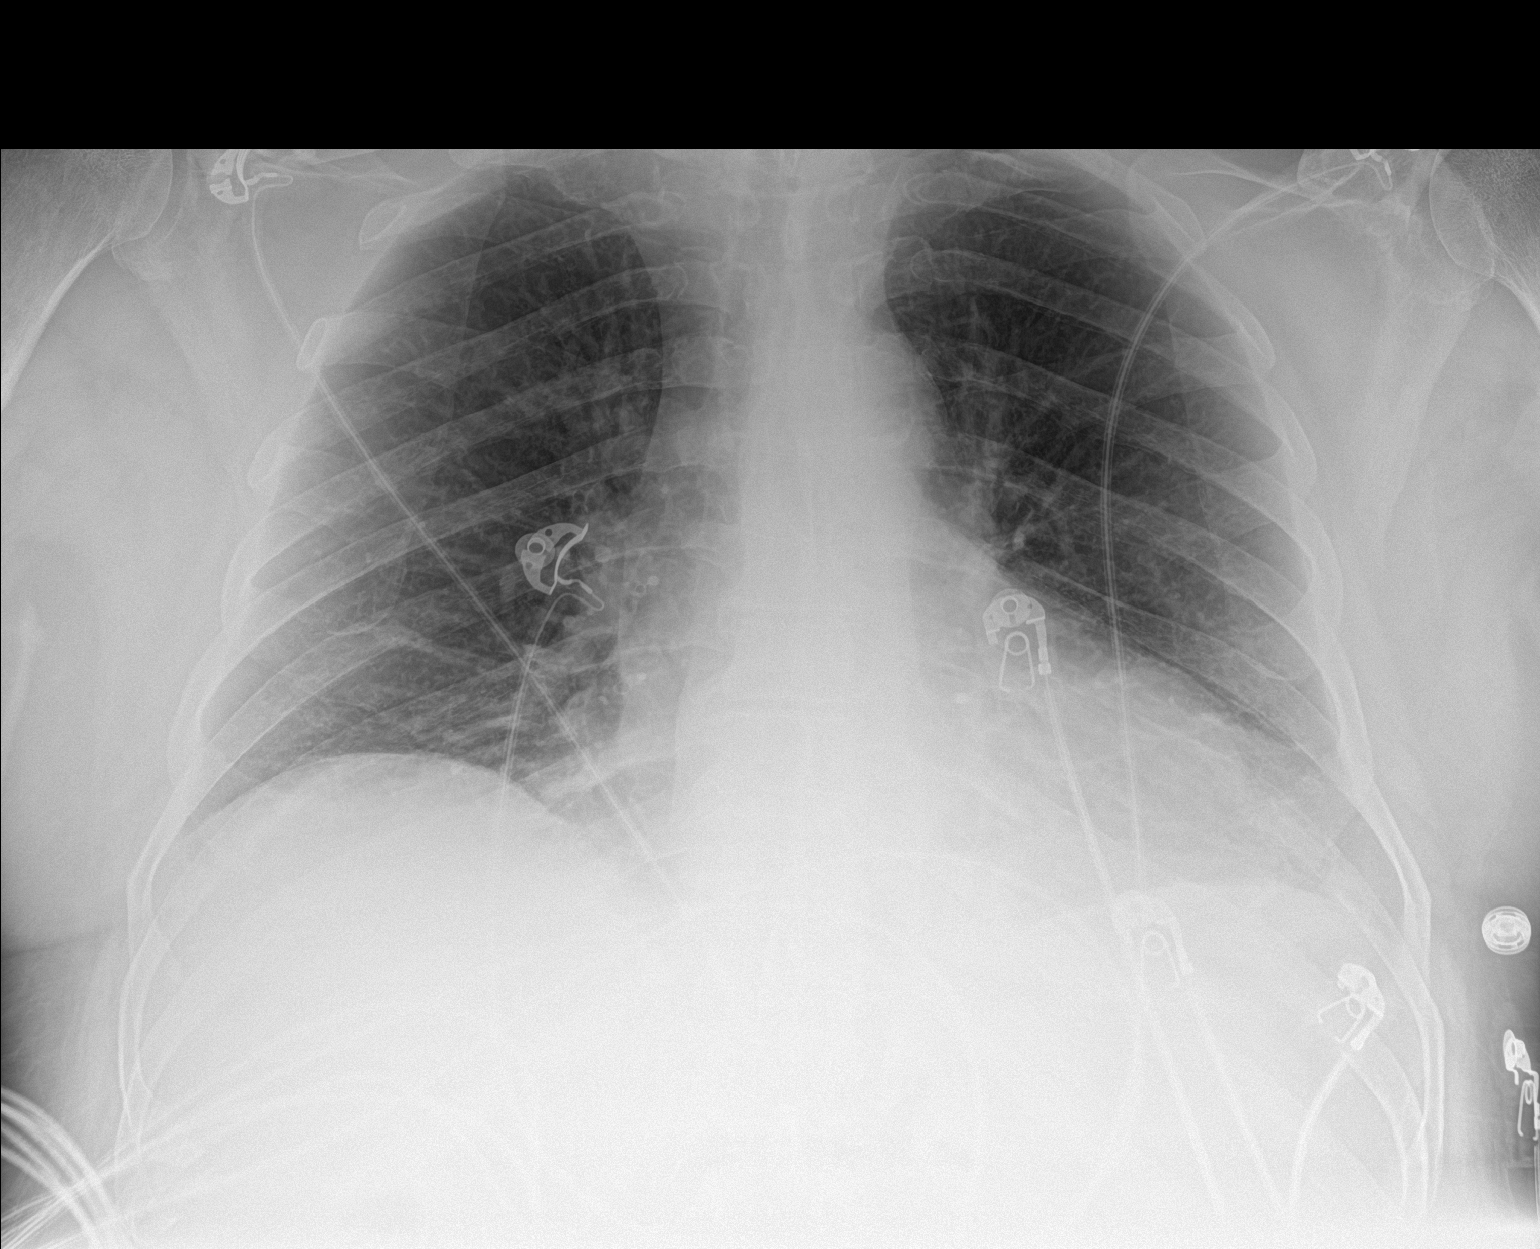

[1 of 1 positions shown; findings below may reference images not displayed]

FINDINGS: Lordotic technique is demonstrated. Lungs are adequately inflated
without focal airspace consolidation or effusion. Stable
cardiomegaly. Minimal degenerate change of the spine.
IMPRESSION: No acute cardiopulmonary disease.

Stable cardiomegaly.

## 2018-01-01 NOTE — Progress Notes (Signed)
Cardiology Office Note   Date:  01/06/2018   ID:  Joseph Mercado, DOB [birth year], MRN [id number]  PCP:  Abigail Miyamoto, MD  Cardiologist:  Ellawyn Wogan [place] MD  Chief Complaint  Patient presents with  . Follow-up    denies chest pains unless he is doing activites (walking long distance), denies SOB and swelling in hands/feet      History of Present Illness: [name] is a 72 y.o. male who is seen for follow up CAD. He has a history of HTN and family history of CAD. Apparently had a stress test 2 years ago that was OK. Seen in June by Dr. Allyson Sabal with a several month history of progressive DOE and chest pain on exertion. Echo showed mild LVH with grade 2 diastolic dysfunction. Valves were OK. Myoview study showed inferolateral ischemia. This led to a cardiac cath on 03/10/17 which showed CTO of the LCx with collaterals to a fairly large OM. PCI was attempted but unable to cross completely into the large OM so unable to recanalize the vessel. It was noted that this was a long and difficult procedure with radiation dose of 4 gray. When seen in follow up last month he was still having some intermittent chest pain and after reviewing options decided to proceed with attempt at CTO PCI. This was performed on 05/14/17. We were unable to complete the procedure. We were able to cross the lesion in subintimal tract but never able to reenter the true lumen distally. He had no problems during the procedure but about 4 hours later developed acute hypotension and chest pain. Echo revealed a pericardial effusion. He underwent emergent pericardiocentesis with removal of 500 cc of blood. He stabilized and was observed overnight. No drainage from catheter and Echo showed minimal effusion. The catheter was withdrawn and the patient again developed acute hemodynamic instability. He was taken to the OR and a pericardial window was performed. There was no active bleeding site identified. His post op course was  uncomplicated. He returned on 05/20/17 with recurrent chest pain. Troponin was mildly elevated with flat trend. Symptoms resolved with Ibuprofen. It was felt that symptoms were more related to some pericardial irritation. Echo showed a small effusion. He was started on iron supplement for anemia.  On follow up today he states he is doing well. He has really been working on his exercise and has doubled his exercise capacity in the last 6 months. He notes he still gets angina if his HR goes over 100. This usually occurs when he increases the speed on his treadmill to > 3 mph. Now on pravastatin for the past month and seems to be tolerating OK.     Past Medical History:  Diagnosis Date  . Angina pectoris (HCC)   . CAD (coronary artery disease)    a. 02/2017: NSTEMI with cath showing 100% Prox Cx stenosis with 95% Mid Cx stenosis and 45% mid-LAD stenosis. Unsuccessful attempt at crossing an occluded mid nondominant circumflex --> plan for staged PCI in coming weeks  . Chest pain 02/2017  . Dyspnea   . GERD (gastroesophageal reflux disease)   . High cholesterol   . History of gout   . Hypertension   . Macular degeneration   . NSTEMI (non-ST elevated myocardial infarction) (HCC) 02/2017  . Pericardial effusion 04/2017    Past Surgical History:  Procedure Laterality Date  . CATARACT EXTRACTION W/ INTRAOCULAR LENS  IMPLANT, BILATERAL Bilateral   . CORONARY ANGIOPLASTY    .  CORONARY BALLOON ANGIOPLASTY N/A 03/10/2017   Procedure: Coronary Balloon Angioplasty;  Surgeon: Runell Gess, MD;  Location: [facility] INVASIVE CV LAB;  Service: Cardiovascular;  Laterality: N/A;  CFX  . CORONARY CTO INTERVENTION N/A 05/14/2017   Procedure: CORONARY CTO INTERVENTION;  Surgeon: [place], Izrael Peak M, MD;  Location: [facility] INVASIVE CV LAB;  Service: Cardiovascular;  Laterality: N/A;  . LEFT HEART CATH AND CORONARY ANGIOGRAPHY N/A 03/10/2017   Procedure: Left Heart Cath and Coronary Angiography;  Surgeon: Runell Gess,  MD;  Location: [facility] INVASIVE CV LAB;  Service: Cardiovascular;  Laterality: N/A;  . PERICARDIOCENTESIS N/A 05/14/2017   Procedure: PERICARDIOCENTESIS;  Surgeon: [place], Andren Bethea M, MD;  Location: [facility] INVASIVE CV LAB;  Service: Cardiovascular;  Laterality: N/A;  . SUBXYPHOID PERICARDIAL WINDOW N/A 05/15/2017   Procedure: SUBXYPHOID PERICARDIAL WINDOW WITH DRAINAGE OF PERICARDIAL EFFUSION;  Surgeon: Alleen Borne, MD;  Location: [facility] OR;  Service: Thoracic;  Laterality: N/A;  . ULTRASOUND GUIDANCE FOR VASCULAR ACCESS  05/14/2017   Procedure: Ultrasound Guidance For Vascular Access;  Surgeon: [place], Khadir Roam M, MD;  Location: [facility] INVASIVE CV LAB;  Service: Cardiovascular;;     Current Outpatient Medications  Medication Sig Dispense Refill  . aspirin 81 MG EC tablet Take 1 tablet (81 mg total) by mouth daily. Resume on 05/20/17. 30 tablet 12  . ezetimibe (ZETIA) 10 MG tablet Take 1 tablet (10 mg total) by mouth daily. 30 tablet 9  . isosorbide mononitrate (IMDUR) 30 MG 24 hr tablet Take 1 tablet (30 mg total) by mouth daily. 90 tablet 4  . lisinopril (PRINIVIL,ZESTRIL) 20 MG tablet Take 1 tablet (20 mg total) by mouth daily. 90 tablet 3  . metoprolol succinate (TOPROL-XL) 50 MG 24 hr tablet Take 1 tablet (50 mg total) by mouth daily. Take with or immediately following a meal. 90 tablet 3  . Multiple Vitamins-Minerals (PRESERVISION AREDS PO) Take 1 capsule by mouth 2 (two) times daily.    . nitroGLYCERIN (NITROSTAT) 0.4 MG SL tablet Place 0.4 mg under the tongue every 5 (five) minutes as needed for chest pain.     . pantoprazole (PROTONIX) 40 MG tablet Take 40 mg by mouth 2 (two) times daily.     . pravastatin (PRAVACHOL) 20 MG tablet      No current facility-administered medications for this visit.     Allergies:   Statins    Social History:  The patient  reports that he has quit smoking. His smoking use included cigarettes. He has a 60.00 pack-year smoking history. He has quit using smokeless tobacco.  His smokeless tobacco use included chew. He reports that he drinks about 42.0 oz of alcohol per week. He reports that he does not use drugs.   Family History:  The patient's family history includes CAD in his brother.    ROS:  Please see the history of present illness.   Otherwise, review of systems are positive for none.   All other systems are reviewed and negative.    PHYSICAL EXAM: VS:  BP 140/82 (BP Location: Left Arm)   Pulse 60   Ht 5\' 11"  (1.803 m)   Wt 231 lb 12.8 oz (105.1 kg)   BMI 32.33 kg/m  , BMI Body mass index is 32.33 kg/m. GENERAL:  Well appearing, overweight WM in NAD HEENT:  PERRL, EOMI, sclera are clear. Oropharynx is clear. NECK:  No jugular venous distention, carotid upstroke brisk and symmetric, no bruits, no thyromegaly or adenopathy LUNGS:  Clear to auscultation bilaterally CHEST:  Unremarkable HEART:  RRR,  PMI not displaced or sustained,S1 and S2 within normal limits, no S3, no S4: no clicks, no rubs, no murmurs ABD:  Soft, nontender. BS +, no masses or bruits. No hepatomegaly, no splenomegaly EXT:  2 + pulses throughout, no edema, no cyanosis no clubbing SKIN:  Warm and dry.  No rashes NEURO:  Alert and oriented x 3. Cranial nerves II through XII intact. PSYCH:  Cognitively intact   EKG:  EKG is not ordered today. The ekg ordered today demonstrates N/A   Recent Labs: 05/20/2017: ALT 17; B Natriuretic Peptide 514.7; TSH 3.881 05/21/2017: BUN 9; Creatinine, Ser 0.88; Potassium 4.7; Sodium 133 07/15/2017: Hemoglobin 14.1; Platelets 216    Lipid Panel    Component Value Date/Time   CHOL 148 03/11/2017 0214   TRIG 72 03/11/2017 0214   HDL 45 03/11/2017 0214   CHOLHDL 3.3 03/11/2017 0214   VLDL 14 03/11/2017 0214   LDLCALC 89 03/11/2017 0214    Labs dated 12/01/17: cholesterol 187, triglycerides 133, HDL 45, LDL 115. Creatinine 1.07. Otherwise CBC and chemistries normal.  Wt Readings from Last 3 Encounters:  01/06/18 231 lb 12.8 oz (105.1 kg)    07/18/17 229 lb (103.9 kg)  06/03/17 224 lb (101.6 kg)      Other studies Reviewed: Cardiac cath 03/10/17: CARDIAC CATHETERIZATION / PCI LCX    History obtained from chart review.Mr. Leseberg is a very pleasant 73 year old moderately overweight divorced Caucasian male father of one child who is accompanied by his friend Olegario Messier today. He was referred by Dr. Marina Goodell in [facility] for cardiovascular evaluation because of chest pain and shortness of breath. His risk factors include family history a brother who had bypass surgery age 45 and treated hypertension. He quit smoking 35 years ago. He is retired Music therapist. He did have a stress test 2 years ago which apparently was normal. Over the last 2 years she had progressive dyspnea and exertional chest pain as well as nocturnal chest pain. He was admitted in transfer from [facility] with chest pain. His troponins increased to 2. He presents now for cardiac catheterization to define his anatomy.   PROCEDURE DESCRIPTION:   The patient was brought to the second floor [facility]iac cath lab in the postabsorptive state. He was  premedicated with Valium 5 mg by mouth, IV Versed and fentanyl. His right wrist was prepped and shaved in usual sterile fashion. Xylocaine 1% was used for local anesthesia. A 6 French sheath was inserted into the right radial  artery using standard Seldinger technique. The patient received 5000 units  of heparin  intravenously.  A 5 [place] TIG catheter and pigtail catheter were used for selective coronary angiography and left ventriculography respectively. Isovue dye was used for the entirety of the case. Retrograde aortic and left ventricular and pullback pressures were recorded. Radial cocktail was administered via the SideArm sheath.  The patient received hydration and 80 mg Brilenta by mouth followed by Angiomax bolus and infusion with a therapeutic ACT demonstrated. Using a 6 [place] XB 3.5 cm guide catheter along with  multiple guidewires including a 014 per water, Fielder XT and a Fighter guidewire I was able to get across the mid AV groove circumflex into the second moderate size marginal branch and angioplasty of the occlusion with a 2 mm x 12 mm balloon. Unfortunately I was unable to redirect the wire and the continuation of the AV groove circumflex in the to the larger third marginal branch. This was collateralized  however. The excessive amount of contrast and x-ray dose utilized during the case I aborted the case and was staged intervention tomorrow. Angiomax was turned off. The sheath was removed and a TR band was placed on the wrist to achieve hemostasis. The patient left the lab in stable condition.    IMPRESSION: Unsuccessful attempt at crossing an occluded mid nondominant circumflex into the AV groove continuation of the posterior lateral branch. Patient does have normal LV function. He was pain-free at the end of the case. He was loaded with aspirin and Brilenta. We will restart heparin without a bolus 4 hours after sheath removal. The plan will be to reattempt circumflex re-intervention tomorrow. The Air Kerma  was 4014 mgr and the fluoroscopytime was 39.6 minutes. Total contrast administered and patient was 190 mL. The ACT was 560.  Nanetta Batty. MD, [facility] 03/10/2017 4:57 PM  Echo 02/26/17: Study Conclusions  - Left ventricle: There was moderate concentric hypertrophy.   Systolic function was normal. The estimated ejection fraction was   in the range of 55% to 60%. Features are consistent with a   pseudonormal left ventricular filling pattern, with concomitant   abnormal relaxation and increased filling pressure (grade 2   diastolic dysfunction). Doppler parameters are consistent with   high ventricular filling pressure. - Left atrium: The atrium was mildly dilated. - Right ventricle: The cavity size was normal. Wall thickness was   normal. Systolic function was normal. - Tricuspid valve:  There was trivial regurgitation. - Pulmonary arteries: Systolic pressure was within the normal   range. PA peak pressure: 27 mm Hg (S). - Pericardium, extracardiac: A trivial pericardial effusion was   identified. - Global longitudinal strain -18.3%.  Myoview 02/26/17: Study Highlights    Inferior (basal) and inferolateral (base, mid) thinning with mild improvement in recovery Does not appear to be significant for ischemia probably reflects soft tissue attenuation (attenuation)  Nuclear stress EF: 61%.  Low risk scan   Procedures   CORONARY CTO INTERVENTION  Conclusion     Mid LAD lesion, 45 %stenosed.  Mid Cx lesion, 100 %stenosed.  Post intervention, there is a 100% residual stenosis.  Prox Cx to Mid Cx lesion, 95 %stenosed.  Post intervention, there is a 30% residual stenosis.   1. Unsuccessful CTO PCI of the LCx. There is some improvement of flow into the small OM with PTCA.    Plan: continue medical management. Will follow and see how he does with medical therapy.    Echo 05/21/17: Study Conclusions  - Left ventricle: The cavity size was normal. Wall thickness was   increased in a pattern of mild LVH. Systolic function was normal.   The estimated ejection fraction was in the range of 60% to 65%.   Although no diagnostic regional wall motion abnormality was   identified, this possibility cannot be completely excluded on the   basis of this study. Features are consistent with a pseudonormal   left ventricular filling pattern, with concomitant abnormal   relaxation and increased filling pressure (grade 2 diastolic   dysfunction). - Aortic valve: There was no stenosis. - Mitral valve: There was mild regurgitation. - Right ventricle: The cavity size was normal. Systolic function   was normal. - Pulmonary arteries: No complete TR doppler jet so unable to   estimate PA systolic pressure. - Systemic veins: IVC not visualized. - Pericardium, extracardiac: There  was no pericardial effusion.  Impressions:  - Normal LV size with mild LV hypertrophy. EF 60-65%. Moderate  diastolic dysfunction. Normal RV size and systolic function. Mild   MR.  ASSESSMENT AND PLAN:  1.  CAD with CTO of the LCx supplying one large OM branch. S/p failed attempt at CTO PCI with complication of acute pericardial tamponade requiring a pericardial window. He has recovered completely. He has stable class 2 angina.  Will continue metoprolol and Imdur. Continue ASA only. He will not be a candidate from another attempt at PCI of this vessel. I am encouraged with his improved exercise tolerance with his exercise program.  2. HTN - under fair control.  3. HLD intolerant of statins. On Zetia. LDL increased to 115. Now added pravastatin.  Current medicines are reviewed at length with the patient today.  The patient does not have concerns regarding medicines.  The following changes have been made:  See above   Signed, Teale Goodgame [place], MD  01/06/2018 11:12 AM    [facility] [address], [place], [place], [address] Phone [phone], Fax [phone]

## 2018-01-06 ENCOUNTER — Encounter: Payer: Self-pay | Admitting: Cardiology

## 2018-01-06 ENCOUNTER — Ambulatory Visit (INDEPENDENT_AMBULATORY_CARE_PROVIDER_SITE_OTHER): Payer: Medicare Other | Admitting: Cardiology

## 2018-01-06 VITALS — BP 140/82 | HR 60 | Ht 71.0 in | Wt 231.8 lb

## 2018-01-06 DIAGNOSIS — E785 Hyperlipidemia, unspecified: Secondary | ICD-10-CM

## 2018-01-06 DIAGNOSIS — I10 Essential (primary) hypertension: Secondary | ICD-10-CM

## 2018-01-06 DIAGNOSIS — I208 Other forms of angina pectoris: Secondary | ICD-10-CM

## 2018-01-06 NOTE — Patient Instructions (Signed)
Continue your current therapy  I will see you in 6 months.   

## 2018-03-30 DIAGNOSIS — I222 Subsequent non-ST elevation (NSTEMI) myocardial infarction: Secondary | ICD-10-CM | POA: Diagnosis not present

## 2018-03-30 DIAGNOSIS — I25110 Atherosclerotic heart disease of native coronary artery with unstable angina pectoris: Secondary | ICD-10-CM | POA: Diagnosis not present

## 2018-03-30 DIAGNOSIS — K219 Gastro-esophageal reflux disease without esophagitis: Secondary | ICD-10-CM | POA: Diagnosis not present

## 2018-03-30 DIAGNOSIS — M1A00X0 Idiopathic chronic gout, unspecified site, without tophus (tophi): Secondary | ICD-10-CM | POA: Diagnosis not present

## 2018-03-30 DIAGNOSIS — L300 Nummular dermatitis: Secondary | ICD-10-CM | POA: Diagnosis not present

## 2018-03-30 DIAGNOSIS — D0439 Carcinoma in situ of skin of other parts of face: Secondary | ICD-10-CM | POA: Diagnosis not present

## 2018-03-30 DIAGNOSIS — L570 Actinic keratosis: Secondary | ICD-10-CM | POA: Diagnosis not present

## 2018-03-30 DIAGNOSIS — E782 Mixed hyperlipidemia: Secondary | ICD-10-CM | POA: Diagnosis not present

## 2018-03-30 DIAGNOSIS — I10 Essential (primary) hypertension: Secondary | ICD-10-CM | POA: Diagnosis not present

## 2018-03-30 DIAGNOSIS — J449 Chronic obstructive pulmonary disease, unspecified: Secondary | ICD-10-CM | POA: Diagnosis not present

## 2018-05-25 DIAGNOSIS — Z23 Encounter for immunization: Secondary | ICD-10-CM | POA: Diagnosis not present

## 2018-06-12 ENCOUNTER — Encounter: Payer: Self-pay | Admitting: Physician Assistant

## 2018-06-12 ENCOUNTER — Ambulatory Visit (INDEPENDENT_AMBULATORY_CARE_PROVIDER_SITE_OTHER): Payer: Medicare Other | Admitting: Physician Assistant

## 2018-06-12 VITALS — BP 152/76 | HR 76 | Ht 71.0 in | Wt 234.0 lb

## 2018-06-12 DIAGNOSIS — I10 Essential (primary) hypertension: Secondary | ICD-10-CM

## 2018-06-12 DIAGNOSIS — E785 Hyperlipidemia, unspecified: Secondary | ICD-10-CM | POA: Diagnosis not present

## 2018-06-12 DIAGNOSIS — R002 Palpitations: Secondary | ICD-10-CM | POA: Diagnosis not present

## 2018-06-12 DIAGNOSIS — I25119 Atherosclerotic heart disease of native coronary artery with unspecified angina pectoris: Secondary | ICD-10-CM

## 2018-06-12 MED ORDER — METOPROLOL SUCCINATE ER 50 MG PO TB24: 50.0000 mg | ORAL_TABLET | Freq: Two times a day (BID) | ORAL | 3 refills | Status: DC

## 2018-06-12 NOTE — Patient Instructions (Addendum)
Your physician has recommended you make the following change in your medication:  INCREASE METOPROLOL SUCC TO 50 MG TWICE DAILY   ALSO TAKE ISOSORBIDE IN AM  AND LISINOPRIL IN THE PM   Your physician wants you to follow-up in: 3 MONTHS WITH DR [place] You will receive a reminder letter in the mail two months in advance. If you don't receive a letter, please call our office to schedule the follow-up appointment.

## 2018-06-12 NOTE — Progress Notes (Signed)
Cardiology Office Note    Date:  06/14/2018   ID:  Joseph Mercado, DOB June 28, 1946, MRN [id number]  PCP:  Abigail Miyamoto, MD  Cardiologist:  Dr. Swaziland   Chief Complaint  Patient presents with  . Follow-up    seen for Dr. Swaziland.    History of Present Illness:  Joseph Mercado is a 72 y.o. male with PMH of CAD, HLD, and HTN.  Due to progressive dyspnea, patient underwent echocardiogram and Myoview in 2018.  Myoview showed inferolateral ischemia.  This led to a cardiac catheterization on 03/10/2017 that showed a CTO of left circumflex with collaterals to a fairly large OM.  PCI was attempted but unable to cross completely into the large OM.  Patient was brought back to the hospital in September 2018 for CTO PCI, this was unsuccessful either.  Procedure was complicated by perforation and the development of cardiac tamponade requiring pericardiocentesis.  He had 450 cc of hemorrhagic fluid removed.  Repeat echo showed minimal effusion.  The pericardial catheter was withdrawn, however patient again developed acute hemodynamic instability.  He was taken to the OR and had a pericardial window performed.  Postop course was uncomplicated.  He returned to the hospital in October 2018 with recurrent chest pain.  Troponin was mildly elevated with flat trend.  Symptom resolved with ibuprofen.  It was felt his symptom was more related to pericardial irritation.  Echocardiogram at that time showed small pericardial effusion.  He was last seen by Dr. Swaziland in May 2019, he has been doing well at the time.  Patient has chronic class II stable angina.  He noticed angina only if his heart rate goes over 100.  Patient presents today for cardiology office visit with his male friend.  Over 2 weeks ago, he did notice some occasional skipped heartbeat.  I think it is likely he was having PACs and PVCs.  His blood pressure has also been running high recently as well.  Over a month ago, he had a episode where he felt  his heart rate was over 100 and he was having a lot of chest pain.  I will increase his Toprol-XL to 50 mg twice daily for better blood pressure control and heart rate control.  I also instructed him to start taking lisinopril at night and to take the Imdur in the morning.  Otherwise he has no lower extremity edema, orthopnea or PND.  Past Medical History:  Diagnosis Date  . Angina pectoris (HCC)   . CAD (coronary artery disease)    a. 02/2017: NSTEMI with cath showing 100% Prox Cx stenosis with 95% Mid Cx stenosis and 45% mid-LAD stenosis. Unsuccessful attempt at crossing an occluded mid nondominant circumflex --> plan for staged PCI in coming weeks  . Chest pain 02/2017  . Dyspnea   . GERD (gastroesophageal reflux disease)   . High cholesterol   . History of gout   . Hypertension   . Macular degeneration   . NSTEMI (non-ST elevated myocardial infarction) (HCC) 02/2017  . Pericardial effusion 04/2017    Past Surgical History:  Procedure Laterality Date  . CATARACT EXTRACTION W/ INTRAOCULAR LENS  IMPLANT, BILATERAL Bilateral   . CORONARY ANGIOPLASTY    . CORONARY BALLOON ANGIOPLASTY N/A 03/10/2017   Procedure: Coronary Balloon Angioplasty;  Surgeon: Runell Gess, MD;  Location: [facility] INVASIVE CV LAB;  Service: Cardiovascular;  Laterality: N/A;  CFX  . CORONARY CTO INTERVENTION N/A 05/14/2017   Procedure: CORONARY CTO INTERVENTION;  Surgeon: Swaziland,  Demetria Pore, MD;  Location: [facility] INVASIVE CV LAB;  Service: Cardiovascular;  Laterality: N/A;  . LEFT HEART CATH AND CORONARY ANGIOGRAPHY N/A 03/10/2017   Procedure: Left Heart Cath and Coronary Angiography;  Surgeon: Runell Gess, MD;  Location: [facility] INVASIVE CV LAB;  Service: Cardiovascular;  Laterality: N/A;  . PERICARDIOCENTESIS N/A 05/14/2017   Procedure: PERICARDIOCENTESIS;  Surgeon: Swaziland, Peter M, MD;  Location: [facility] INVASIVE CV LAB;  Service: Cardiovascular;  Laterality: N/A;  . SUBXYPHOID PERICARDIAL WINDOW N/A 05/15/2017   Procedure:  SUBXYPHOID PERICARDIAL WINDOW WITH DRAINAGE OF PERICARDIAL EFFUSION;  Surgeon: Alleen Borne, MD;  Location: [facility] OR;  Service: Thoracic;  Laterality: N/A;  . ULTRASOUND GUIDANCE FOR VASCULAR ACCESS  05/14/2017   Procedure: Ultrasound Guidance For Vascular Access;  Surgeon: Swaziland, Peter M, MD;  Location: [facility] INVASIVE CV LAB;  Service: Cardiovascular;;    Current Medications: Outpatient Medications Prior to Visit  Medication Sig Dispense Refill  . aspirin 81 MG EC tablet Take 1 tablet (81 mg total) by mouth daily. Resume on 05/20/17. 30 tablet 12  . ezetimibe (ZETIA) 10 MG tablet Take 1 tablet (10 mg total) by mouth daily. 30 tablet 9  . isosorbide mononitrate (IMDUR) 30 MG 24 hr tablet Take 1 tablet (30 mg total) by mouth daily. 90 tablet 4  . lisinopril (PRINIVIL,ZESTRIL) 20 MG tablet Take 1 tablet (20 mg total) by mouth daily. 90 tablet 3  . Multiple Vitamins-Minerals (PRESERVISION AREDS PO) Take 1 capsule by mouth 2 (two) times daily.    . nitroGLYCERIN (NITROSTAT) 0.4 MG SL tablet Place 0.4 mg under the tongue every 5 (five) minutes as needed for chest pain.     . pantoprazole (PROTONIX) 40 MG tablet Take 40 mg by mouth 2 (two) times daily.     . pravastatin (PRAVACHOL) 20 MG tablet Take 20 mg by mouth daily. Per patient he takes this 1 po every other day    . metoprolol succinate (TOPROL-XL) 50 MG 24 hr tablet Take 1 tablet (50 mg total) by mouth daily. Take with or immediately following a meal. 90 tablet 3  . pravastatin (PRAVACHOL) 20 MG tablet      No facility-administered medications prior to visit.      Allergies:   Statins   Social History   Socioeconomic History  . Marital status: Divorced    Spouse name: Not on file  . Number of children: Not on file  . Years of education: Not on file  . Highest education level: Not on file  Occupational History  . Not on file  Social Needs  . Financial resource strain: Not on file  . Food insecurity:    Worry: Not on file     Inability: Not on file  . Transportation needs:    Medical: Not on file    Non-medical: Not on file  Tobacco Use  . Smoking status: Former Smoker    Packs/day: 3.00    Years: 20.00    Pack years: 60.00    Types: Cigarettes  . Smokeless tobacco: Former Neurosurgeon    Types: Chew  . Tobacco comment: "quit smoking in the 1980s; chewed 4-5 years after I quit smoking"  Substance and Sexual Activity  . Alcohol use: Yes    Alcohol/week: 70.0 standard drinks    Types: 70 Cans of beer per week    Comment: 05/14/2017 "6-10 beers/day"  . Drug use: No  . Sexual activity: Not on file  Lifestyle  . Physical activity:  Days per week: Not on file    Minutes per session: Not on file  . Stress: Not on file  Relationships  . Social connections:    Talks on phone: Not on file    Gets together: Not on file    Attends religious service: Not on file    Active member of club or organization: Not on file    Attends meetings of clubs or organizations: Not on file    Relationship status: Not on file  Other Topics Concern  . Not on file  Social History Narrative  . Not on file     Family History:  The patient's family history includes CAD in his brother.   ROS:   Please see the history of present illness.    ROS All other systems reviewed and are negative.   PHYSICAL EXAM:   VS:  BP (!) 152/76   Pulse 76   Ht 5\' 11"  (1.803 m)   Wt 234 lb (106.1 kg)   SpO2 96%   BMI 32.64 kg/m    GEN: Well nourished, well developed, in no acute distress  HEENT: normal  Neck: no JVD, carotid bruits, or masses Cardiac: RRR; no murmurs, rubs, or gallops,no edema  Respiratory:  clear to auscultation bilaterally, normal work of breathing GI: soft, nontender, nondistended, + BS MS: no deformity or atrophy  Skin: warm and dry, no rash Neuro:  Alert and Oriented x 3, Strength and sensation are intact Psych: euthymic mood, full affect  Wt Readings from Last 3 Encounters:  06/12/18 234 lb (106.1 kg)  01/06/18  231 lb 12.8 oz (105.1 kg)  07/18/17 229 lb (103.9 kg)      Studies/Labs Reviewed:   EKG:  EKG is ordered today.  The ekg ordered today demonstrates normal sinus rhythm without significant ST-T wave changes.  Recent Labs: 07/15/2017: Hemoglobin 14.1; Platelets 216   Lipid Panel    Component Value Date/Time   CHOL 148 03/11/2017 0214   TRIG 72 03/11/2017 0214   HDL 45 03/11/2017 0214   CHOLHDL 3.3 03/11/2017 0214   VLDL 14 03/11/2017 0214   LDLCALC 89 03/11/2017 0214    Additional studies/ records that were reviewed today include:   Cath 05/14/2017  Mid LAD lesion, 45 %stenosed.  Mid Cx lesion, 100 %stenosed.  Post intervention, there is a 100% residual stenosis.  Prox Cx to Mid Cx lesion, 95 %stenosed.  Post intervention, there is a 30% residual stenosis.   1. Unsuccessful CTO PCI of the LCx. There is some improvement of flow into the small OM with PTCA.    Plan: continue medical management. Will follow and see how he does with medical therapy.     Pericardiocentesis 05/14/2017 Successful pericardiocentesis for cardiac tamponade related to coronary perforation post attempted PCI. Removal of 450 cc of hemorrhagic fluid.   Plan: leave drain in place overnight. Observe amount of drainage. Hold antiplatelet therapy and antihypertensive therapy for now. Will repeat limited Echo in am. Will plan to remove pericardial drain when bleeding has ceased   ASSESSMENT:    1. Palpitation   2. Essential hypertension   3. Coronary artery disease involving native coronary artery of native heart with angina pectoris (HCC)   4. Hyperlipidemia, unspecified hyperlipidemia type      PLAN:  In order of problems listed above:  1. Palpitation: He has been having some skipped heartbeat and occasional palpitation.  He has a history of chest discomfort if his heart rate increased over 100 bpm.  I will increase his metoprolol succinate to 50 mg twice daily.  2. CAD: Known CTO of  the left circumflex, he had attempted CTO PCI in September 2018 which was unsuccessful resulting in perforation and tamponade which required urgent pericardiocentesis.  3. Hypertension: Blood pressure stable  4. Hyperlipidemia: Continue Zetia and Pravachol.    Medication Adjustments/Labs and Tests Ordered: Current medicines are reviewed at length with the patient today.  Concerns regarding medicines are outlined above.  Medication changes, Labs and Tests ordered today are listed in the Patient Instructions below. Patient Instructions  Your physician has recommended you make the following change in your medication:  INCREASE METOPROLOL SUCC TO 50 MG TWICE DAILY   ALSO TAKE ISOSORBIDE IN AM  AND LISINOPRIL IN THE PM   Your physician wants you to follow-up in: 3 MONTHS WITH DR Swaziland You will receive a reminder letter in the mail two months in advance. If you don't receive a letter, please call our office to schedule the follow-up appointment.     Ramond Dial, [place]  06/14/2018 11:54 PM    [facility] [address] [place], [place], [place]  [address] Phone: [phone]; Fax: [phone]

## 2018-06-14 ENCOUNTER — Encounter: Payer: Self-pay | Admitting: Physician Assistant

## 2018-06-26 ENCOUNTER — Other Ambulatory Visit: Payer: Self-pay | Admitting: Cardiology

## 2018-07-06 ENCOUNTER — Ambulatory Visit: Payer: Medicare Other | Admitting: Cardiology

## 2018-08-03 DIAGNOSIS — L720 Epidermal cyst: Secondary | ICD-10-CM | POA: Diagnosis not present

## 2018-08-03 DIAGNOSIS — L821 Other seborrheic keratosis: Secondary | ICD-10-CM | POA: Diagnosis not present

## 2018-08-04 ENCOUNTER — Other Ambulatory Visit: Payer: Self-pay | Admitting: Cardiology

## 2018-08-05 NOTE — Telephone Encounter (Signed)
 *  STAT* If patient is at the pharmacy, call can be transferred to refill team.   1. Which medications need to be refilled? (please list name of each medication and dose if known) ezetimibe (ZETIA) 10 MG tablet  isosorbide mononitrate (IMDUR) 30 MG 24 hr tablet  2. Which pharmacy/location (including street and city if local pharmacy) is medication to be sent to?  [facility]  3. Do they need a 30 day or 90 day supply? [place]

## 2018-08-06 MED ORDER — EZETIMIBE 10 MG PO TABS: 10.0000 mg | ORAL_TABLET | Freq: Every day | ORAL | 3 refills | Status: DC

## 2018-08-06 NOTE — Telephone Encounter (Signed)
Pharmacy called stating they sent fax and patient called several times. Patient is currently waiting at the pharmacy.

## 2018-09-13 NOTE — Progress Notes (Signed)
Cardiology Office Note   Date:  09/17/2018   ID:  Joseph Mercado, DOB [birth year], MRN [id number]  PCP:  Joseph Miyamoto, MD  Cardiologist:   [place] MD  Chief Complaint  Patient presents with  . Coronary Artery Disease  . Hypertension      History of Present Illness: Joseph Mercado is a 73 y.o. male who is seen for follow up CAD. He has a history of HTN and family history of CAD. Apparently had a stress test 2 years ago that was OK. Seen in June 2018 with a several month history of progressive DOE and chest pain on exertion. Echo showed mild LVH with grade 2 diastolic dysfunction. Valves were OK. Myoview study showed inferolateral ischemia. This led to a cardiac cath on 03/10/17 which showed CTO of the LCx with collaterals to a fairly large OM. PCI was attempted but unable to cross completely into the large OM so unable to recanalize the vessel. It was noted that this was a long and difficult procedure with radiation dose of 4 gray. When seen in follow up last month he was still having some intermittent chest pain and after reviewing options decided to proceed with attempt at CTO PCI. This was performed on 05/14/17. We were unable to complete the procedure. We were able to cross the lesion in subintimal tract but never able to reenter the true lumen distally. He had no problems during the procedure but about 4 hours later developed acute hypotension and chest pain. Echo revealed a pericardial effusion. He underwent emergent pericardiocentesis with removal of 500 cc of blood. He stabilized and was observed overnight. No drainage from catheter and Echo showed minimal effusion. The catheter was withdrawn and the patient again developed acute hemodynamic instability. He was taken to the OR and a pericardial window was performed. There was no active bleeding site identified. His post op course was uncomplicated. He returned on 05/20/17 with recurrent chest pain. Troponin was mildly elevated with  flat trend. Symptoms resolved with Ibuprofen. It was felt that symptoms were more related to some pericardial irritation.   On his last visit in October 2019  he was seen by Azalee Course PA-C. Notes some chest pain when HR elevated. Also noted some palpitations. Toprol XL dose was increased.   Since then he still notes he gets chest tightness if his HR increases over 94. This has improved with increased Toprol. He is able to walk 40 minutes on a treadmill daily. Resting HR has decreased from 76 to 61. He notes he takes pravastatin every other day. If he takes it every day he feels awful.     Past Medical History:  Diagnosis Date  . Angina pectoris (HCC)   . CAD (coronary artery disease)    a. 02/2017: NSTEMI with cath showing 100% Prox Cx stenosis with 95% Mid Cx stenosis and 45% mid-LAD stenosis. Unsuccessful attempt at crossing an occluded mid nondominant circumflex --> plan for staged PCI in coming weeks  . Chest pain 02/2017  . Dyspnea   . GERD (gastroesophageal reflux disease)   . High cholesterol   . History of gout   . Hypertension   . Macular degeneration   . NSTEMI (non-ST elevated myocardial infarction) (HCC) 02/2017  . Pericardial effusion 04/2017    Past Surgical History:  Procedure Laterality Date  . CATARACT EXTRACTION W/ INTRAOCULAR LENS  IMPLANT, BILATERAL Bilateral   . CORONARY ANGIOPLASTY    . CORONARY BALLOON ANGIOPLASTY N/A 03/10/2017   Procedure:  Coronary Balloon Angioplasty;  Surgeon: Runell Gess, MD;  Location: [facility] INVASIVE CV LAB;  Service: Cardiovascular;  Laterality: N/A;  CFX  . CORONARY CTO INTERVENTION N/A 05/14/2017   Procedure: CORONARY CTO INTERVENTION;  Surgeon: Joseph Mercado,  M, MD;  Location: [facility] INVASIVE CV LAB;  Service: Cardiovascular;  Laterality: N/A;  . LEFT HEART CATH AND CORONARY ANGIOGRAPHY N/A 03/10/2017   Procedure: Left Heart Cath and Coronary Angiography;  Surgeon: Runell Gess, MD;  Location: [facility] INVASIVE CV LAB;  Service:  Cardiovascular;  Laterality: N/A;  . PERICARDIOCENTESIS N/A 05/14/2017   Procedure: PERICARDIOCENTESIS;  Surgeon: Joseph Mercado,  M, MD;  Location: [facility] INVASIVE CV LAB;  Service: Cardiovascular;  Laterality: N/A;  . SUBXYPHOID PERICARDIAL WINDOW N/A 05/15/2017   Procedure: SUBXYPHOID PERICARDIAL WINDOW WITH DRAINAGE OF PERICARDIAL EFFUSION;  Surgeon: Alleen Borne, MD;  Location: [facility] OR;  Service: Thoracic;  Laterality: N/A;  . ULTRASOUND GUIDANCE FOR VASCULAR ACCESS  05/14/2017   Procedure: Ultrasound Guidance For Vascular Access;  Surgeon: Joseph Mercado,  M, MD;  Location: [facility] INVASIVE CV LAB;  Service: Cardiovascular;;     Current Outpatient Medications  Medication Sig Dispense Refill  . aspirin 81 MG EC tablet Take 1 tablet (81 mg total) by mouth daily. Resume on 05/20/17. 30 tablet 12  . ezetimibe (ZETIA) 10 MG tablet Take 1 tablet (10 mg total) by mouth daily. 90 tablet 3  . isosorbide mononitrate (IMDUR) 30 MG 24 hr tablet TAKE ONE TABLET BY MOUTH ONCE DAILY 90 tablet 3  . lisinopril (PRINIVIL,ZESTRIL) 20 MG tablet TAKE ONE TABLET BY MOUTH ONCE DAILY 90 tablet 2  . metoprolol succinate (TOPROL-XL) 50 MG 24 hr tablet Take 1 tablet (50 mg total) by mouth 2 (two) times daily. Take with or immediately following a meal. 180 tablet 3  . Multiple Vitamins-Minerals (PRESERVISION AREDS PO) Take 1 capsule by mouth 2 (two) times daily.    . nitroGLYCERIN (NITROSTAT) 0.4 MG SL tablet Place 0.4 mg under the tongue every 5 (five) minutes as needed for chest pain.     . pantoprazole (PROTONIX) 40 MG tablet Take 40 mg by mouth 2 (two) times daily.     . pravastatin (PRAVACHOL) 20 MG tablet Take 20 mg by mouth daily. Per patient he takes this 1 po every other day     No current facility-administered medications for this visit.     Allergies:   Statins    Social History:  The patient  reports that he has quit smoking. His smoking use included cigarettes. He has a 60.00 pack-year smoking history. He has quit  using smokeless tobacco.  His smokeless tobacco use included chew. He reports current alcohol use of about 70.0 standard drinks of alcohol per week. He reports that he does not use drugs.   Family History:  The patient's family history includes CAD in his brother.    ROS:  Please see the history of present illness.   Otherwise, review of systems are positive for none.   All other systems are reviewed and negative.    PHYSICAL EXAM: VS:  BP 126/70   Pulse 61   Ht 5\' 11"  (1.803 m)   Wt 237 lb (107.5 kg)   BMI 33.05 kg/m  , BMI Body mass index is 33.05 kg/m. GENERAL:  Well appearing obese WM in NAD HEENT:  PERRL, EOMI, sclera are clear. Oropharynx is clear. NECK:  No jugular venous distention, carotid upstroke brisk and symmetric, no bruits, no thyromegaly or adenopathy LUNGS:  Clear to  auscultation bilaterally CHEST:  Unremarkable HEART:  RRR,  PMI not displaced or sustained,S1 and S2 within normal limits, no S3, no S4: no clicks, no rubs, no murmurs ABD:  Soft, nontender. BS +, no masses or bruits. No hepatomegaly, no splenomegaly EXT:  2 + pulses throughout, no edema, no cyanosis no clubbing SKIN:  Warm and dry.  No rashes NEURO:  Alert and oriented x 3. Cranial nerves II through XII intact. PSYCH:  Cognitively intact     EKG:  EKG is not ordered today. The ekg ordered today demonstrates N/A   Recent Labs: No results found for requested labs within last 8760 hours.    Lipid Panel    Component Value Date/Time   CHOL 148 03/11/2017 0214   TRIG 72 03/11/2017 0214   HDL 45 03/11/2017 0214   CHOLHDL 3.3 03/11/2017 0214   VLDL 14 03/11/2017 0214   LDLCALC 89 03/11/2017 0214    Labs dated 12/01/17: cholesterol 187, triglycerides 133, HDL 45, LDL 115. Creatinine 1.07. Otherwise CBC and chemistries normal. Dated 03/30/18: cholesterol 160, triglycerides 123, HDL 46, LDL 89. Creatinine 1.11, potassium 5.3. CBC and ALT normal.   Wt Readings from Last 3 Encounters:  09/17/18 237  lb (107.5 kg)  06/12/18 234 lb (106.1 kg)  01/06/18 231 lb 12.8 oz (105.1 kg)      Other studies Reviewed:  Echo 02/26/17: Study Conclusions  - Left ventricle: There was moderate concentric hypertrophy.   Systolic function was normal. The estimated ejection fraction was   in the range of 55% to 60%. Features are consistent with a   pseudonormal left ventricular filling pattern, with concomitant   abnormal relaxation and increased filling pressure (grade 2   diastolic dysfunction). Doppler parameters are consistent with   high ventricular filling pressure. - Left atrium: The atrium was mildly dilated. - Right ventricle: The cavity size was normal. Wall thickness was   normal. Systolic function was normal. - Tricuspid valve: There was trivial regurgitation. - Pulmonary arteries: Systolic pressure was within the normal   range. PA peak pressure: 27 mm Hg (S). - Pericardium, extracardiac: A trivial pericardial effusion was   identified. - Global longitudinal strain -18.3%.  Myoview 02/26/17: Study Highlights    Inferior (basal) and inferolateral (base, mid) thinning with mild improvement in recovery Does not appear to be significant for ischemia probably reflects soft tissue attenuation (attenuation)  Nuclear stress EF: 61%.  Low risk scan   Procedures   CORONARY CTO INTERVENTION  Conclusion     Mid LAD lesion, 45 %stenosed.  Mid Cx lesion, 100 %stenosed.  Post intervention, there is a 100% residual stenosis.  Prox Cx to Mid Cx lesion, 95 %stenosed.  Post intervention, there is a 30% residual stenosis.   1. Unsuccessful CTO PCI of the LCx. There is some improvement of flow into the small OM with PTCA.    Plan: continue medical management. Will follow and see how he does with medical therapy.    Echo 05/21/17: Study Conclusions  - Left ventricle: The cavity size was normal. Wall thickness was   increased in a pattern of mild LVH. Systolic function was  normal.   The estimated ejection fraction was in the range of 60% to 65%.   Although no diagnostic regional wall motion abnormality was   identified, this possibility cannot be completely excluded on the   basis of this study. Features are consistent with a pseudonormal   left ventricular filling pattern, with concomitant abnormal   relaxation  and increased filling pressure (grade 2 diastolic   dysfunction). - Aortic valve: There was no stenosis. - Mitral valve: There was mild regurgitation. - Right ventricle: The cavity size was normal. Systolic function   was normal. - Pulmonary arteries: No complete TR doppler jet so unable to   estimate PA systolic pressure. - Systemic veins: IVC not visualized. - Pericardium, extracardiac: There was no pericardial effusion.  Impressions:  - Normal LV size with mild LV hypertrophy. EF 60-65%. Moderate   diastolic dysfunction. Normal RV size and systolic function. Mild   MR.  ASSESSMENT AND PLAN:  1.  CAD with CTO of the LCx supplying one large OM branch. S/p failed attempt at CTO PCI with complication of acute pericardial tamponade requiring a pericardial window.  He has stable class 2 angina. Improved with increase in Toprol dose.  Will continue metoprolol and Imdur. Continue ASA. Continue exercise program.  2. HTN - under good control.  3. HLD intolerant of statins. On Zetia and pravastatin every other day. Scheduled for lab with primary care next month. Ideally goal LDL < 70. If lipids look poor may consider a PCSK 9 inhibitor.   Current medicines are reviewed at length with the patient today.  The patient does not have concerns regarding medicines.  The following changes have been made:  See above   Signed,  [place], MD  09/17/2018 8:54 AM    [facility] [address], [place], [place], [address] Phone [phone], Fax [phone]

## 2018-09-17 ENCOUNTER — Encounter (INDEPENDENT_AMBULATORY_CARE_PROVIDER_SITE_OTHER): Payer: Self-pay

## 2018-09-17 ENCOUNTER — Ambulatory Visit (INDEPENDENT_AMBULATORY_CARE_PROVIDER_SITE_OTHER): Payer: Medicare Other | Admitting: Cardiology

## 2018-09-17 ENCOUNTER — Encounter: Payer: Self-pay | Admitting: Cardiology

## 2018-09-17 VITALS — BP 126/70 | HR 61 | Ht 71.0 in | Wt 237.0 lb

## 2018-09-17 DIAGNOSIS — I10 Essential (primary) hypertension: Secondary | ICD-10-CM

## 2018-09-17 DIAGNOSIS — E785 Hyperlipidemia, unspecified: Secondary | ICD-10-CM | POA: Diagnosis not present

## 2018-09-17 DIAGNOSIS — I25119 Atherosclerotic heart disease of native coronary artery with unspecified angina pectoris: Secondary | ICD-10-CM | POA: Diagnosis not present

## 2018-09-17 MED ORDER — NITROGLYCERIN 0.4 MG SL SUBL: 0.4000 mg | SUBLINGUAL_TABLET | SUBLINGUAL | 6 refills | Status: DC | PRN

## 2018-09-30 DIAGNOSIS — Z6833 Body mass index (BMI) 33.0-33.9, adult: Secondary | ICD-10-CM | POA: Diagnosis not present

## 2018-09-30 DIAGNOSIS — I2510 Atherosclerotic heart disease of native coronary artery without angina pectoris: Secondary | ICD-10-CM | POA: Diagnosis not present

## 2018-09-30 DIAGNOSIS — K219 Gastro-esophageal reflux disease without esophagitis: Secondary | ICD-10-CM | POA: Diagnosis not present

## 2018-09-30 DIAGNOSIS — I10 Essential (primary) hypertension: Secondary | ICD-10-CM | POA: Diagnosis not present

## 2018-09-30 DIAGNOSIS — H353113 Nonexudative age-related macular degeneration, right eye, advanced atrophic without subfoveal involvement: Secondary | ICD-10-CM | POA: Diagnosis not present

## 2018-09-30 DIAGNOSIS — E782 Mixed hyperlipidemia: Secondary | ICD-10-CM | POA: Diagnosis not present

## 2018-09-30 DIAGNOSIS — M1A00X0 Idiopathic chronic gout, unspecified site, without tophus (tophi): Secondary | ICD-10-CM | POA: Diagnosis not present

## 2018-09-30 DIAGNOSIS — J449 Chronic obstructive pulmonary disease, unspecified: Secondary | ICD-10-CM | POA: Diagnosis not present

## 2019-01-13 DIAGNOSIS — Z6833 Body mass index (BMI) 33.0-33.9, adult: Secondary | ICD-10-CM | POA: Diagnosis not present

## 2019-01-13 DIAGNOSIS — Z1159 Encounter for screening for other viral diseases: Secondary | ICD-10-CM | POA: Diagnosis not present

## 2019-01-13 DIAGNOSIS — Z0000 Encounter for general adult medical examination without abnormal findings: Secondary | ICD-10-CM | POA: Diagnosis not present

## 2019-03-25 ENCOUNTER — Other Ambulatory Visit: Payer: Self-pay | Admitting: Cardiology

## 2019-03-25 ENCOUNTER — Other Ambulatory Visit: Payer: Self-pay

## 2019-03-25 MED ORDER — LISINOPRIL 20 MG PO TABS: 20.0000 mg | ORAL_TABLET | Freq: Every day | ORAL | 1 refills | Status: DC

## 2019-03-25 NOTE — Telephone Encounter (Signed)
°*  STAT* If patient is at the pharmacy, call can be transferred to refill team.   1. Which medications need to be refilled? (please list name of each medication and dose if known) lisinopril (PRINIVIL,ZESTRIL) 20 MG tablet  2. Which pharmacy/location (including street and city if local pharmacy) is medication to be sent to? [facility] [address], [place], [place]  3. Do they need a 30 day or 90 day supply? 90 day

## 2019-03-25 NOTE — Telephone Encounter (Signed)
Rx(s) sent to pharmacy electronically.  

## 2019-03-26 NOTE — Telephone Encounter (Signed)
Medication refilled yesterday, 03/25/19.

## 2019-03-31 DIAGNOSIS — M1A00X0 Idiopathic chronic gout, unspecified site, without tophus (tophi): Secondary | ICD-10-CM | POA: Diagnosis not present

## 2019-03-31 DIAGNOSIS — I25110 Atherosclerotic heart disease of native coronary artery with unstable angina pectoris: Secondary | ICD-10-CM | POA: Diagnosis not present

## 2019-03-31 DIAGNOSIS — J449 Chronic obstructive pulmonary disease, unspecified: Secondary | ICD-10-CM | POA: Diagnosis not present

## 2019-03-31 DIAGNOSIS — E782 Mixed hyperlipidemia: Secondary | ICD-10-CM | POA: Diagnosis not present

## 2019-03-31 DIAGNOSIS — K219 Gastro-esophageal reflux disease without esophagitis: Secondary | ICD-10-CM | POA: Diagnosis not present

## 2019-03-31 DIAGNOSIS — I10 Essential (primary) hypertension: Secondary | ICD-10-CM | POA: Diagnosis not present

## 2019-03-31 DIAGNOSIS — E291 Testicular hypofunction: Secondary | ICD-10-CM | POA: Diagnosis not present

## 2019-03-31 DIAGNOSIS — Z6833 Body mass index (BMI) 33.0-33.9, adult: Secondary | ICD-10-CM | POA: Diagnosis not present

## 2019-04-01 ENCOUNTER — Encounter: Payer: Self-pay | Admitting: Cardiology

## 2019-04-06 DIAGNOSIS — E875 Hyperkalemia: Secondary | ICD-10-CM | POA: Diagnosis not present

## 2019-04-25 NOTE — Progress Notes (Signed)
Cardiology Office Note   Date:  04/30/2019   ID:  Joseph Mercado, DOB 1946-02-20, MRN [id number]  PCP:  Abigail Miyamoto, MD  Cardiologist:  Nelsy Madonna [place] MD  Chief Complaint  Patient presents with   Coronary Artery Disease      History of Present Illness: Joseph Mercado is a 73 y.o. male who is seen for follow up CAD. He has a history of HTN and family history of CAD.  Seen in June 2018 with a several month history of progressive DOE and chest pain on exertion. Echo showed mild LVH with grade 2 diastolic dysfunction. Valves were OK. Myoview study showed inferolateral ischemia. This led to a cardiac cath on 03/10/17 which showed CTO of the LCx with collaterals to a fairly large OM. PCI was attempted but unable to cross completely into the large OM so unable to recanalize the vessel. It was noted that this was a long and difficult procedure with radiation dose of 4 gray. When seen in follow up he was still having some intermittent chest pain and after reviewing options decided to proceed with attempt at CTO PCI. This was performed on 05/14/17. We were unable to complete the procedure. We were able to cross the lesion in subintimal tract but never able to reenter the true lumen distally. He had no problems during the procedure but about 4 hours later developed acute hypotension and chest pain. Echo revealed a pericardial effusion. He underwent emergent pericardiocentesis with removal of 500 cc of blood. He stabilized and was observed overnight. No drainage from catheter and Echo showed minimal effusion. The catheter was withdrawn and the patient again developed acute hemodynamic instability. He was taken to the OR and a pericardial window was performed. There was no active bleeding site identified. His post op course was uncomplicated. He returned on 05/20/17 with recurrent chest pain. Troponin was mildly elevated with flat trend. Symptoms resolved with Ibuprofen. It was felt that symptoms were more  related to some pericardial irritation.   On follow up today he is feeling OK. He still notes he gets chest tightness if his HR increases over 90. Tries to walk some. Hasn't been to gym since pandemic began. This has improved with increased Toprol. He notes he takes pravastatin every other day. If he takes it every day he feels awful. No increase in chest pain or dyspnea.     Past Medical History:  Diagnosis Date   Angina pectoris (HCC)    CAD (coronary artery disease)    a. 02/2017: NSTEMI with cath showing 100% Prox Cx stenosis with 95% Mid Cx stenosis and 45% mid-LAD stenosis. Unsuccessful attempt at crossing an occluded mid nondominant circumflex --> plan for staged PCI in coming weeks   Chest pain 02/2017   Dyspnea    GERD (gastroesophageal reflux disease)    High cholesterol    History of gout    Hypertension    Macular degeneration    NSTEMI (non-ST elevated myocardial infarction) (HCC) 02/2017   Pericardial effusion 04/2017    Past Surgical History:  Procedure Laterality Date   CATARACT EXTRACTION W/ INTRAOCULAR LENS  IMPLANT, BILATERAL Bilateral    CORONARY ANGIOPLASTY     CORONARY BALLOON ANGIOPLASTY N/A 03/10/2017   Procedure: Coronary Balloon Angioplasty;  Surgeon: Runell Gess, MD;  Location: [facility] INVASIVE CV LAB;  Service: Cardiovascular;  Laterality: N/A;  CFX   CORONARY CTO INTERVENTION N/A 05/14/2017   Procedure: CORONARY CTO INTERVENTION;  Surgeon: [place], Jordin Vicencio M, MD;  Location: [facility] INVASIVE CV [facility];  Service: Cardiovascular;  Laterality: N/A;   LEFT HEART CATH AND CORONARY ANGIOGRAPHY N/A 03/10/2017   Procedure: Left Heart Cath and Coronary Angiography;  Surgeon: Runell Gess, MD;  Location: [facility] INVASIVE CV LAB;  Service: Cardiovascular;  Laterality: N/A;   PERICARDIOCENTESIS N/A 05/14/2017   Procedure: PERICARDIOCENTESIS;  Surgeon: [place], Khyson Sebesta M, MD;  Location: [facility] INVASIVE CV LAB;  Service: Cardiovascular;  Laterality: N/A;   SUBXYPHOID  PERICARDIAL WINDOW N/A 05/15/2017   Procedure: SUBXYPHOID PERICARDIAL WINDOW WITH DRAINAGE OF PERICARDIAL EFFUSION;  Surgeon: Alleen Borne, MD;  Location: [facility] OR;  Service: Thoracic;  Laterality: N/A;   ULTRASOUND GUIDANCE FOR VASCULAR ACCESS  05/14/2017   Procedure: Ultrasound Guidance For Vascular Access;  Surgeon: [place], Giannis Corpuz M, MD;  Location: [facility] INVASIVE CV LAB;  Service: Cardiovascular;;     Current Outpatient Medications  Medication Sig Dispense Refill   allopurinol (ZYLOPRIM) 100 MG tablet Take 100 mg by mouth 2 (two) times daily.     aspirin 81 MG EC tablet Take 1 tablet (81 mg total) by mouth daily. Resume on 05/20/17. 30 tablet 12   ezetimibe (ZETIA) 10 MG tablet Take 1 tablet (10 mg total) by mouth daily. 90 tablet 3   isosorbide mononitrate (IMDUR) 30 MG 24 hr tablet TAKE ONE TABLET BY MOUTH ONCE DAILY 90 tablet 3   lisinopril (ZESTRIL) 20 MG tablet Take 1 tablet (20 mg total) by mouth daily. 90 tablet 1   metoprolol succinate (TOPROL-XL) 50 MG 24 hr tablet Take 1 tablet (50 mg total) by mouth 2 (two) times daily. Take with or immediately following a meal. 180 tablet 3   Multiple Vitamins-Minerals (PRESERVISION AREDS PO) Take 1 capsule by mouth 2 (two) times daily.     nitroGLYCERIN (NITROSTAT) 0.4 MG SL tablet Place 1 tablet (0.4 mg total) under the tongue every 5 (five) minutes as needed for chest pain. 25 tablet 6   pantoprazole (PROTONIX) 40 MG tablet Take 40 mg by mouth 2 (two) times daily.      pravastatin (PRAVACHOL) 20 MG tablet Take 20 mg by mouth daily. Per patient he takes this 1 po every other day     No current facility-administered medications for this visit.     Allergies:   Statins    Social History:  The patient  reports that he has quit smoking. His smoking use included cigarettes. He has a 60.00 pack-year smoking history. He has quit using smokeless tobacco.  His smokeless tobacco use included chew. He reports current alcohol use of about 70.0  standard drinks of alcohol per week. He reports that he does not use drugs.   Family History:  The patient's family history includes CAD in his brother.    ROS:  Please see the history of present illness.   Otherwise, review of systems are positive for none.   All other systems are reviewed and negative.    PHYSICAL EXAM: VS:  BP 140/80    Pulse 65    Ht 5\' 11"  (1.803 m)    Wt 238 lb 4 oz (108.1 kg)    BMI 33.23 kg/m  , BMI Body mass index is 33.23 kg/m. GENERAL:  Well appearing obese WM in NAD HEENT:  PERRL, EOMI, sclera are clear. Oropharynx is clear. NECK:  No jugular venous distention, carotid upstroke brisk and symmetric, no bruits, no thyromegaly or adenopathy LUNGS:  Clear to auscultation bilaterally CHEST:  Unremarkable HEART:  RRR,  PMI not displaced or sustained,S1  and S2 within normal limits, no S3, no S4: no clicks, no rubs, no murmurs ABD:  Soft, nontender. BS +, no masses or bruits. No hepatomegaly, no splenomegaly EXT:  2 + pulses throughout, no edema, no cyanosis no clubbing SKIN:  Warm and dry.  No rashes NEURO:  Alert and oriented x 3. Cranial nerves II through XII intact. PSYCH:  Cognitively intact     EKG:  EKG ordered today. The ekg ordered today demonstrates NSR with LAD. No changed. I have personally reviewed and interpreted this study.    Recent Labs: No results found for requested labs within last 8760 hours.    Lipid Panel    Component Value Date/Time   CHOL 148 03/11/2017 0214   TRIG 72 03/11/2017 0214   HDL 45 03/11/2017 0214   CHOLHDL 3.3 03/11/2017 0214   VLDL 14 03/11/2017 0214   LDLCALC 89 03/11/2017 0214    Labs dated 12/01/17: cholesterol 187, triglycerides 133, HDL 45, LDL 115. Creatinine 1.07. Otherwise CBC and chemistries normal. Dated 03/30/18: cholesterol 160, triglycerides 123, HDL 46, LDL 89. Creatinine 1.11, potassium 5.3. CBC and ALT normal.  Dated 03/31/19: cholesterol 148, triglycerides 99, HDL 41, LDL 87. Creatinine 1.09. CMET  and CBC normal.   Wt Readings from Last 3 Encounters:  04/30/19 238 lb 4 oz (108.1 kg)  09/17/18 237 lb (107.5 kg)  06/12/18 234 lb (106.1 kg)      Other studies Reviewed:  Echo 02/26/17: Study Conclusions  - Left ventricle: There was moderate concentric hypertrophy.   Systolic function was normal. The estimated ejection fraction was   in the range of 55% to 60%. Features are consistent with a   pseudonormal left ventricular filling pattern, with concomitant   abnormal relaxation and increased filling pressure (grade 2   diastolic dysfunction). Doppler parameters are consistent with   high ventricular filling pressure. - Left atrium: The atrium was mildly dilated. - Right ventricle: The cavity size was normal. Wall thickness was   normal. Systolic function was normal. - Tricuspid valve: There was trivial regurgitation. - Pulmonary arteries: Systolic pressure was within the normal   range. PA peak pressure: 27 mm Hg (S). - Pericardium, extracardiac: A trivial pericardial effusion was   identified. - Global longitudinal strain -18.3%.  Myoview 02/26/17: Study Highlights    Inferior (basal) and inferolateral (base, mid) thinning with mild improvement in recovery Does not appear to be significant for ischemia probably reflects soft tissue attenuation (attenuation)  Nuclear stress EF: 61%.  Low risk scan   Procedures   CORONARY CTO INTERVENTION  Conclusion     Mid LAD lesion, 45 %stenosed.  Mid Cx lesion, 100 %stenosed.  Post intervention, there is a 100% residual stenosis.  Prox Cx to Mid Cx lesion, 95 %stenosed.  Post intervention, there is a 30% residual stenosis.   1. Unsuccessful CTO PCI of the LCx. There is some improvement of flow into the small OM with PTCA.    Plan: continue medical management. Will follow and see how he does with medical therapy.    Echo 05/21/17: Study Conclusions  - Left ventricle: The cavity size was normal. Wall thickness  was   increased in a pattern of mild LVH. Systolic function was normal.   The estimated ejection fraction was in the range of 60% to 65%.   Although no diagnostic regional wall motion abnormality was   identified, this possibility cannot be completely excluded on the   basis of this study. Features are consistent with  a pseudonormal   left ventricular filling pattern, with concomitant abnormal   relaxation and increased filling pressure (grade 2 diastolic   dysfunction). - Aortic valve: There was no stenosis. - Mitral valve: There was mild regurgitation. - Right ventricle: The cavity size was normal. Systolic function   was normal. - Pulmonary arteries: No complete TR doppler jet so unable to   estimate PA systolic pressure. - Systemic veins: IVC not visualized. - Pericardium, extracardiac: There was no pericardial effusion.  Impressions:  - Normal LV size with mild LV hypertrophy. EF 60-65%. Moderate   diastolic dysfunction. Normal RV size and systolic function. Mild   MR.  ASSESSMENT AND PLAN:  1.  CAD with CTO of the LCx supplying one large OM branch. S/p failed attempt at CTO PCI with complication of acute pericardial tamponade requiring a pericardial window.  He has stable class 2 angina.  Will continue metoprolol and Imdur. Use Ntg sl prn. Continue ASA. Continue exercise program.  2. HTN - under good control.  3. HLD On Zetia and pravastatin every other day. Unable to tolerate higher doses.  LDL 87.   Current medicines are reviewed at length with the patient today.  The patient does not have concerns regarding medicines.  Follow up 6 months   Signed, Jaquelynn Wanamaker [place], MD  04/30/2019 8:38 AM    [facility] [address] [place], [place], [address] Phone [phone], Fax [phone]

## 2019-04-30 ENCOUNTER — Other Ambulatory Visit: Payer: Self-pay

## 2019-04-30 ENCOUNTER — Encounter: Payer: Self-pay | Admitting: Cardiology

## 2019-04-30 ENCOUNTER — Ambulatory Visit (INDEPENDENT_AMBULATORY_CARE_PROVIDER_SITE_OTHER): Payer: Medicare Other | Admitting: Cardiology

## 2019-04-30 VITALS — BP 140/80 | HR 65 | Ht 71.0 in | Wt 238.2 lb

## 2019-04-30 DIAGNOSIS — E785 Hyperlipidemia, unspecified: Secondary | ICD-10-CM

## 2019-04-30 DIAGNOSIS — I10 Essential (primary) hypertension: Secondary | ICD-10-CM | POA: Diagnosis not present

## 2019-04-30 DIAGNOSIS — I25119 Atherosclerotic heart disease of native coronary artery with unspecified angina pectoris: Secondary | ICD-10-CM

## 2019-05-03 ENCOUNTER — Other Ambulatory Visit: Payer: Self-pay

## 2019-05-03 DIAGNOSIS — I10 Essential (primary) hypertension: Secondary | ICD-10-CM

## 2019-05-03 DIAGNOSIS — I25119 Atherosclerotic heart disease of native coronary artery with unspecified angina pectoris: Secondary | ICD-10-CM

## 2019-05-03 DIAGNOSIS — R002 Palpitations: Secondary | ICD-10-CM

## 2019-05-03 DIAGNOSIS — E785 Hyperlipidemia, unspecified: Secondary | ICD-10-CM

## 2019-05-03 DIAGNOSIS — I209 Angina pectoris, unspecified: Secondary | ICD-10-CM

## 2019-05-07 DIAGNOSIS — Z23 Encounter for immunization: Secondary | ICD-10-CM | POA: Diagnosis not present

## 2019-06-23 DIAGNOSIS — R05 Cough: Secondary | ICD-10-CM | POA: Diagnosis not present

## 2019-06-23 DIAGNOSIS — J018 Other acute sinusitis: Secondary | ICD-10-CM | POA: Diagnosis not present

## 2019-06-26 ENCOUNTER — Other Ambulatory Visit: Payer: Self-pay | Admitting: Cardiology

## 2019-07-26 ENCOUNTER — Other Ambulatory Visit: Payer: Self-pay

## 2019-07-26 MED ORDER — EZETIMIBE 10 MG PO TABS: 10.0000 mg | ORAL_TABLET | Freq: Every day | ORAL | 2 refills | Status: DC

## 2019-07-29 ENCOUNTER — Other Ambulatory Visit: Payer: Self-pay | Admitting: Cardiology

## 2019-07-29 ENCOUNTER — Telehealth: Payer: Self-pay | Admitting: Cardiology

## 2019-07-29 ENCOUNTER — Other Ambulatory Visit: Payer: Self-pay

## 2019-07-29 MED ORDER — EZETIMIBE 10 MG PO TABS: 10.0000 mg | ORAL_TABLET | Freq: Every day | ORAL | 2 refills | Status: DC

## 2019-07-29 NOTE — Telephone Encounter (Signed)
Disp Refills Start End   ezetimibe (ZETIA) 10 MG tablet 90 tablet 2 07/29/2019    Sig - Route: Take 1 tablet (10 mg total) by mouth daily. - Oral   Sent to pharmacy as: ezetimibe (ZETIA) 10 MG tablet   E-Prescribing Status: Sent to pharmacy (07/29/2019  3:34 PM EST)   Pharmacy  [facility], [place] - [address] AT [place]

## 2019-07-29 NOTE — Telephone Encounter (Signed)
°*  STAT* If patient is at the pharmacy, call can be transferred to refill team.   1. Which medications need to be refilled? (please list name of each medication and dose if known) ezetimibe (ZETIA) 10 MG tablet  2. Which pharmacy/location (including street and city if local pharmacy) is medication to be sent to? [id number] - [place], [place] - [address] AT [address]  3. Do they need a 30 day or 90 day supply? 90 day

## 2019-09-20 ENCOUNTER — Other Ambulatory Visit: Payer: Self-pay | Admitting: *Deleted

## 2019-09-20 MED ORDER — LISINOPRIL 20 MG PO TABS: 20.0000 mg | ORAL_TABLET | Freq: Every day | ORAL | 1 refills | Status: DC

## 2019-10-02 DIAGNOSIS — Z23 Encounter for immunization: Secondary | ICD-10-CM | POA: Diagnosis not present

## 2019-10-08 ENCOUNTER — Ambulatory Visit: Payer: Medicare Other | Admitting: Legal Medicine

## 2019-10-10 DIAGNOSIS — M1A9XX0 Chronic gout, unspecified, without tophus (tophi): Secondary | ICD-10-CM | POA: Insufficient documentation

## 2019-10-10 DIAGNOSIS — H353110 Nonexudative age-related macular degeneration, right eye, stage unspecified: Secondary | ICD-10-CM

## 2019-10-10 DIAGNOSIS — N529 Male erectile dysfunction, unspecified: Secondary | ICD-10-CM

## 2019-10-10 DIAGNOSIS — J449 Chronic obstructive pulmonary disease, unspecified: Secondary | ICD-10-CM | POA: Insufficient documentation

## 2019-10-10 DIAGNOSIS — M1A00X0 Idiopathic chronic gout, unspecified site, without tophus (tophi): Secondary | ICD-10-CM | POA: Insufficient documentation

## 2019-10-10 DIAGNOSIS — J4489 Other specified chronic obstructive pulmonary disease: Secondary | ICD-10-CM

## 2019-10-10 HISTORY — DX: Chronic gout, unspecified, without tophus (tophi): M1A.9XX0

## 2019-10-10 HISTORY — DX: Nonexudative age-related macular degeneration, right eye, stage unspecified: H35.3110

## 2019-10-10 HISTORY — DX: Idiopathic chronic gout, unspecified site, without tophus (tophi): M1A.00X0

## 2019-10-10 HISTORY — DX: Other specified chronic obstructive pulmonary disease: J44.89

## 2019-10-10 HISTORY — DX: Male erectile dysfunction, unspecified: N52.9

## 2019-10-11 ENCOUNTER — Ambulatory Visit (INDEPENDENT_AMBULATORY_CARE_PROVIDER_SITE_OTHER): Payer: Medicare Other | Admitting: Legal Medicine

## 2019-10-11 ENCOUNTER — Other Ambulatory Visit: Payer: Self-pay

## 2019-10-11 ENCOUNTER — Encounter: Payer: Self-pay | Admitting: Legal Medicine

## 2019-10-11 VITALS — BP 138/82 | HR 72 | Temp 97.1°F | Ht 71.0 in | Wt 246.0 lb

## 2019-10-11 DIAGNOSIS — H353112 Nonexudative age-related macular degeneration, right eye, intermediate dry stage: Secondary | ICD-10-CM | POA: Diagnosis not present

## 2019-10-11 DIAGNOSIS — J449 Chronic obstructive pulmonary disease, unspecified: Secondary | ICD-10-CM

## 2019-10-11 DIAGNOSIS — E785 Hyperlipidemia, unspecified: Secondary | ICD-10-CM | POA: Diagnosis not present

## 2019-10-11 DIAGNOSIS — M1A00X0 Idiopathic chronic gout, unspecified site, without tophus (tophi): Secondary | ICD-10-CM

## 2019-10-11 DIAGNOSIS — N529 Male erectile dysfunction, unspecified: Secondary | ICD-10-CM | POA: Diagnosis not present

## 2019-10-11 DIAGNOSIS — I10 Essential (primary) hypertension: Secondary | ICD-10-CM | POA: Diagnosis not present

## 2019-10-11 NOTE — Progress Notes (Signed)
Allergies  ROS Review of Systems  Constitutional: Negative.   HENT: Negative.   Eyes: Negative.   Respiratory: Negative.   Cardiovascular: Positive for chest pain.  Endocrine: Negative.   Genitourinary: Negative.   Musculoskeletal: Negative.   Allergic/Immunologic: Negative.   Neurological: Positive for numbness.  Hematological: Negative.       Objective:    Physical Exam  Constitutional: He is oriented to person, place, and time. He appears well-developed and well-nourished.  HENT:  Head: Normocephalic and atraumatic.  Eyes: Pupils are equal, round, and reactive to light. Conjunctivae and EOM are normal.  Cardiovascular: Normal rate, regular rhythm and normal heart sounds.  Pulmonary/Chest: Effort normal and breath sounds normal.  Abdominal: Soft. Bowel sounds are normal.  Musculoskeletal:        General: Normal range of motion.     Cervical back: Normal range of motion and neck supple.  Neurological: He is alert and oriented to person, place, and time. He has normal reflexes.  Skin: Skin is warm and dry.  Psychiatric: He has a normal mood and affect.    BP 138/82   Pulse 72   Temp (!) 97.1 F (36.2 C)   Ht 5\' 11"  (1.803 m)   Wt 246 lb (111.6 kg)   SpO2 97%   BMI 34.31 kg/m  Wt Readings from Last 3 Encounters:   10/11/19 246 lb (111.6 kg)  04/30/19 238 lb 4 oz (108.1 kg)  09/17/18 237 lb (107.5 kg)     Health Maintenance Due  Topic Date Due  . Hepatitis C Screening  11-03-1945  . TETANUS/TDAP  08/27/1964  . COLONOSCOPY  08/28/1995  . PNA vac Low Risk Adult (1 of 2 - PCV13) 08/27/2010    There are no preventive care reminders to display for this patient.  Lab Results  Component Value Date   TSH 3.881 05/20/2017   Lab Results  Component Value Date   WBC 9.4 07/15/2017   HGB 14.1 07/15/2017   HCT 43.6 07/15/2017   MCV 90 07/15/2017   PLT 216 07/15/2017   Lab Results  Component Value Date   NA 133 (L) 05/21/2017   K 4.7 05/21/2017   CO2 27 05/21/2017   GLUCOSE 98 05/21/2017   BUN 9 05/21/2017   CREATININE 0.88 05/21/2017   BILITOT 1.2 05/20/2017   ALKPHOS 64 05/20/2017   AST 23 05/20/2017   ALT 17 05/20/2017   PROT 5.7 (L) 05/20/2017   ALBUMIN 2.9 (L) 05/20/2017   CALCIUM 8.9 05/21/2017   ANIONGAP 8 05/21/2017   Lab Results  Component Value Date   CHOL 148 03/11/2017   Lab Results  Component Value Date   HDL 45 03/11/2017   Lab Results  Component Value Date   LDLCALC 89 03/11/2017   Lab Results  Component Value Date   TRIG 72 03/11/2017   Lab Results  Component Value Date   CHOLHDL 3.3 03/11/2017   Lab Results  Component Value Date   HGBA1C 4.7 (L) 05/20/2017      Assessment & Plan:   Problem List Items Addressed This Visit      Cardiovascular and Mediastinum   Essential hypertension - Primary    An individual care plan was established and reinforced today.  The patient's status was assessed using clinical findings onwell exam and labs or diagnostic tests. The patient's success at meeting treatment goals on disease specific evidence-based guidelines and found to be well controlled. SELF MANAGEMENT: The patient and I together assessed ways to personally  Established Patient Office Visit  Subjective:  Patient ID: Joseph Mercado, male    DOB: [birth year]  Age: 74 y.o. MRN: [id number]  CC:  Chief Complaint  Patient presents with  . Hypertension  . Hyperlipidemia  . COPD  . Gastroesophageal Reflux    HPI Joseph Mercado presents for Chronic visit.  Patient presents for follow up of hypertension.  Patient tolerating lisinopril, metoprolol well with side effects.  Patient was diagnosed with hypertension 2018 so has been treated for hypertension for 2 years.Patient is working on maintaining diet and exercise regimen and follows up as directed. Complication include MI.  Patient presents with hyperlipidemia.  Compliance with treatment has been good; patient takes medicines as directed, maintains low cholesterol diet, follows up as directed, and maintains exercise regimen.  Patient is using zetia. pravastatin without problems.  Patient presents with diagnosis of COPD.  It is not secondary to prolonged asthma.  Diagnosis 20102  Treatment includes albuterol.  The diagnosis has not been hospitalized for this diagnosis. Last nevwer  Patient is compliant with regular use of medicines.  Patient has gastroesophageal reflux symptoms withesophagitis and LTRD.  The symptoms are mild intensity.  Length of symptoms 5 years.  Medicines include pantoprazole.  Complications include none.    Past Medical History:  Diagnosis Date  . Angina pectoris ([place])   . CAD (coronary artery disease)    a. 02/2017: NSTEMI with cath showing 100% Prox Cx stenosis with 95% Mid Cx stenosis and 45% mid-LAD stenosis. Unsuccessful attempt at crossing an occluded mid nondominant circumflex --> plan for staged PCI in coming weeks  . Chest pain 02/2017  . Dyspnea   . GERD (gastroesophageal reflux disease)   . High cholesterol   . History of gout   . Hypertension   . Macular degeneration   . NSTEMI (non-ST elevated myocardial infarction) ([place]) 02/2017  . Pericardial effusion 04/2017      Past Surgical History:  Procedure Laterality Date  . CATARACT EXTRACTION W/ INTRAOCULAR LENS  IMPLANT, BILATERAL Bilateral   . CORONARY ANGIOPLASTY    . CORONARY BALLOON ANGIOPLASTY N/A 03/10/2017   Procedure: Coronary Balloon Angioplasty;  Surgeon: Lorretta Harp, MD;  Location: [facility];  Service: Cardiovascular;  Laterality: N/A;  CFX  . CORONARY CTO INTERVENTION N/A 05/14/2017   Procedure: CORONARY CTO INTERVENTION;  Surgeon: Martinique, Peter M, MD;  Location: [facility];  Service: Cardiovascular;  Laterality: N/A;  . LEFT HEART CATH AND CORONARY ANGIOGRAPHY N/A 03/10/2017   Procedure: Left Heart Cath and Coronary Angiography;  Surgeon: Lorretta Harp, MD;  Location: [facility];  Service: Cardiovascular;  Laterality: N/A;  . PERICARDIOCENTESIS N/A 05/14/2017   Procedure: PERICARDIOCENTESIS;  Surgeon: Martinique, Peter M, MD;  Location: [facility];  Service: Cardiovascular;  Laterality: N/A;  . SUBXYPHOID PERICARDIAL WINDOW N/A 05/15/2017   Procedure: SUBXYPHOID PERICARDIAL WINDOW WITH DRAINAGE OF PERICARDIAL EFFUSION;  Surgeon: Gaye Pollack, MD;  Location: [facility];  Service: Thoracic;  Laterality: N/A;  . ULTRASOUND GUIDANCE FOR VASCULAR ACCESS  05/14/2017   Procedure: Ultrasound Guidance For Vascular Access;  Surgeon: Martinique, Peter M, MD;  Location: [facility];  Service: Cardiovascular;;    Family History  Problem Relation Age of Onset  . CAD Brother        CABG in his 61's    Social History   Socioeconomic History  . Marital status: Divorced    Spouse name: Not on file  . Number of  Established Patient Office Visit  Subjective:  Patient ID: Joseph Mercado, male    DOB: [birth year]  Age: 74 y.o. MRN: [id number]  CC:  Chief Complaint  Patient presents with  . Hypertension  . Hyperlipidemia  . COPD  . Gastroesophageal Reflux    HPI Joseph Mercado presents for Chronic visit.  Patient presents for follow up of hypertension.  Patient tolerating lisinopril, metoprolol well with side effects.  Patient was diagnosed with hypertension 2018 so has been treated for hypertension for 2 years.Patient is working on maintaining diet and exercise regimen and follows up as directed. Complication include MI.  Patient presents with hyperlipidemia.  Compliance with treatment has been good; patient takes medicines as directed, maintains low cholesterol diet, follows up as directed, and maintains exercise regimen.  Patient is using zetia. pravastatin without problems.  Patient presents with diagnosis of COPD.  It is not secondary to prolonged asthma.  Diagnosis 20102  Treatment includes albuterol.  The diagnosis has not been hospitalized for this diagnosis. Last nevwer  Patient is compliant with regular use of medicines.  Patient has gastroesophageal reflux symptoms withesophagitis and LTRD.  The symptoms are mild intensity.  Length of symptoms 5 years.  Medicines include pantoprazole.  Complications include none.    Past Medical History:  Diagnosis Date  . Angina pectoris ([place])   . CAD (coronary artery disease)    a. 02/2017: NSTEMI with cath showing 100% Prox Cx stenosis with 95% Mid Cx stenosis and 45% mid-LAD stenosis. Unsuccessful attempt at crossing an occluded mid nondominant circumflex --> plan for staged PCI in coming weeks  . Chest pain 02/2017  . Dyspnea   . GERD (gastroesophageal reflux disease)   . High cholesterol   . History of gout   . Hypertension   . Macular degeneration   . NSTEMI (non-ST elevated myocardial infarction) ([place]) 02/2017  . Pericardial effusion 04/2017      Past Surgical History:  Procedure Laterality Date  . CATARACT EXTRACTION W/ INTRAOCULAR LENS  IMPLANT, BILATERAL Bilateral   . CORONARY ANGIOPLASTY    . CORONARY BALLOON ANGIOPLASTY N/A 03/10/2017   Procedure: Coronary Balloon Angioplasty;  Surgeon: Lorretta Harp, MD;  Location: [facility];  Service: Cardiovascular;  Laterality: N/A;  CFX  . CORONARY CTO INTERVENTION N/A 05/14/2017   Procedure: CORONARY CTO INTERVENTION;  Surgeon: Martinique, Peter M, MD;  Location: [facility];  Service: Cardiovascular;  Laterality: N/A;  . LEFT HEART CATH AND CORONARY ANGIOGRAPHY N/A 03/10/2017   Procedure: Left Heart Cath and Coronary Angiography;  Surgeon: Lorretta Harp, MD;  Location: [facility];  Service: Cardiovascular;  Laterality: N/A;  . PERICARDIOCENTESIS N/A 05/14/2017   Procedure: PERICARDIOCENTESIS;  Surgeon: Martinique, Peter M, MD;  Location: [facility];  Service: Cardiovascular;  Laterality: N/A;  . SUBXYPHOID PERICARDIAL WINDOW N/A 05/15/2017   Procedure: SUBXYPHOID PERICARDIAL WINDOW WITH DRAINAGE OF PERICARDIAL EFFUSION;  Surgeon: Gaye Pollack, MD;  Location: [facility];  Service: Thoracic;  Laterality: N/A;  . ULTRASOUND GUIDANCE FOR VASCULAR ACCESS  05/14/2017   Procedure: Ultrasound Guidance For Vascular Access;  Surgeon: Martinique, Peter M, MD;  Location: [facility];  Service: Cardiovascular;;    Family History  Problem Relation Age of Onset  . CAD Brother        CABG in his 61's    Social History   Socioeconomic History  . Marital status: Divorced    Spouse name: Not on file  . Number of  Allergies  ROS Review of Systems  Constitutional: Negative.   HENT: Negative.   Eyes: Negative.   Respiratory: Negative.   Cardiovascular: Positive for chest pain.  Endocrine: Negative.   Genitourinary: Negative.   Musculoskeletal: Negative.   Allergic/Immunologic: Negative.   Neurological: Positive for numbness.  Hematological: Negative.       Objective:    Physical Exam  Constitutional: He is oriented to person, place, and time. He appears well-developed and well-nourished.  HENT:  Head: Normocephalic and atraumatic.  Eyes: Pupils are equal, round, and reactive to light. Conjunctivae and EOM are normal.  Cardiovascular: Normal rate, regular rhythm and normal heart sounds.  Pulmonary/Chest: Effort normal and breath sounds normal.  Abdominal: Soft. Bowel sounds are normal.  Musculoskeletal:        General: Normal range of motion.     Cervical back: Normal range of motion and neck supple.  Neurological: He is alert and oriented to person, place, and time. He has normal reflexes.  Skin: Skin is warm and dry.  Psychiatric: He has a normal mood and affect.    BP 138/82   Pulse 72   Temp (!) 97.1 F (36.2 C)   Ht 5\' 11"  (1.803 m)   Wt 246 lb (111.6 kg)   SpO2 97%   BMI 34.31 kg/m  Wt Readings from Last 3 Encounters:   10/11/19 246 lb (111.6 kg)  04/30/19 238 lb 4 oz (108.1 kg)  09/17/18 237 lb (107.5 kg)     Health Maintenance Due  Topic Date Due  . Hepatitis C Screening  11-03-1945  . TETANUS/TDAP  08/27/1964  . COLONOSCOPY  08/28/1995  . PNA vac Low Risk Adult (1 of 2 - PCV13) 08/27/2010    There are no preventive care reminders to display for this patient.  Lab Results  Component Value Date   TSH 3.881 05/20/2017   Lab Results  Component Value Date   WBC 9.4 07/15/2017   HGB 14.1 07/15/2017   HCT 43.6 07/15/2017   MCV 90 07/15/2017   PLT 216 07/15/2017   Lab Results  Component Value Date   NA 133 (L) 05/21/2017   K 4.7 05/21/2017   CO2 27 05/21/2017   GLUCOSE 98 05/21/2017   BUN 9 05/21/2017   CREATININE 0.88 05/21/2017   BILITOT 1.2 05/20/2017   ALKPHOS 64 05/20/2017   AST 23 05/20/2017   ALT 17 05/20/2017   PROT 5.7 (L) 05/20/2017   ALBUMIN 2.9 (L) 05/20/2017   CALCIUM 8.9 05/21/2017   ANIONGAP 8 05/21/2017   Lab Results  Component Value Date   CHOL 148 03/11/2017   Lab Results  Component Value Date   HDL 45 03/11/2017   Lab Results  Component Value Date   LDLCALC 89 03/11/2017   Lab Results  Component Value Date   TRIG 72 03/11/2017   Lab Results  Component Value Date   CHOLHDL 3.3 03/11/2017   Lab Results  Component Value Date   HGBA1C 4.7 (L) 05/20/2017      Assessment & Plan:   Problem List Items Addressed This Visit      Cardiovascular and Mediastinum   Essential hypertension - Primary    An individual care plan was established and reinforced today.  The patient's status was assessed using clinical findings onwell exam and labs or diagnostic tests. The patient's success at meeting treatment goals on disease specific evidence-based guidelines and found to be well controlled. SELF MANAGEMENT: The patient and I together assessed ways to personally

## 2019-10-11 NOTE — Assessment & Plan Note (Signed)
An individual care plan was established and reinforced today.  The patient's status was assessed using clinical findings onwell exam and labs or diagnostic tests. The patient's success at meeting treatment goals on disease specific evidence-based guidelines and found to be well controlled. SELF MANAGEMENT: The patient and I together assessed ways to personally work towards obtaining the recommended goals. RECOMMENDATIONS: avoid decongestants found in common cold remedies, decrease consumption of alcohol, perform routine monitoring of BP with home BP cuff, exercise, reduction of dietary salt, take medicines as prescribed, try not to miss doses and quit smoking.  Regular exercise and maintaining a healthy weight is needed.  Stress reduction may help. A CLINICAL SUMMARY including written plan identify barriers to care unique to individual due to social or financial issues.  We attempt to mutually creat solutions for individual and family understanding.

## 2019-10-11 NOTE — Assessment & Plan Note (Signed)

## 2019-10-11 NOTE — Assessment & Plan Note (Signed)
AN INDIVIDUAL CARE PLAN was established and reinforced today.  The patient's status was assessed using clinical findings on exam, labs, and other diagnostic testing. Patient's success at meeting treatment goals based on disease specific evidence-bassed guidelines and found to be in good control. RECOMMENDATIONS include maintaining present medicines and treatment. No gouty attack flairs.

## 2019-10-11 NOTE — Assessment & Plan Note (Signed)
AN INDIVIDUAL CARE PLAN was established and reinforced today.  The patient's status was assessed using clinical findings on exam, labs, and other diagnostic testing. Patient's success at meeting treatment goals based on disease specific evidence-bassed guidelines and found to be in fair control. RECOMMENDATIONS include maintaining present medicines and treatment. 

## 2019-10-11 NOTE — Assessment & Plan Note (Signed)
AN INDIVIDUAL CARE PLAN was established and reinforced today.  The patient's status was assessed using clinical findings on exam, labs, and other diagnostic testing. Patient's success at meeting treatment goals based on disease specific evidence-bassed guidelines and found to be in good control. RECOMMENDATIONS include maintaining present medicines and treatment. 

## 2019-10-12 LAB — LIPID PANEL
Chol/HDL Ratio: 3.6 ratio (ref 0.0–5.0)
Cholesterol, Total: 154 mg/dL (ref 100–199)
HDL: 43 mg/dL (ref >39)
LDL Chol Calc (NIH): 85 mg/dL (ref 0–99)
Triglycerides: 147 mg/dL (ref 0–149)
VLDL Cholesterol Cal: 26 mg/dL (ref 5–40)

## 2019-10-12 LAB — CBC WITH DIFFERENTIAL/PLATELET
Basophils Absolute: 0.1 10*3/uL (ref 0.0–0.2)
Basos: 1 %
EOS (ABSOLUTE): 0.2 10*3/uL (ref 0.0–0.4)
Eos: 2 %
Hematocrit: 44.6 % (ref 37.5–51.0)
Hemoglobin: 13.9 g/dL (ref 13.0–17.7)
Immature Grans (Abs): 0 10*3/uL (ref 0.0–0.1)
Immature Granulocytes: 0 %
Lymphocytes Absolute: 2.5 10*3/uL (ref 0.7–3.1)
Lymphs: 24 %
MCH: 26.2 pg — ABNORMAL LOW (ref 26.6–33.0)
MCHC: 31.2 g/dL — ABNORMAL LOW (ref 31.5–35.7)
MCV: 84 fL (ref 79–97)
Monocytes Absolute: 0.8 10*3/uL (ref 0.1–0.9)
Monocytes: 8 %
Neutrophils Absolute: 6.6 10*3/uL (ref 1.4–7.0)
Neutrophils: 65 %
Platelets: 245 10*3/uL (ref 150–450)
RBC: 5.3 x10E6/uL (ref 4.14–5.80)
RDW: 14.4 % (ref 11.6–15.4)
WBC: 10.2 10*3/uL (ref 3.4–10.8)

## 2019-10-12 LAB — COMPREHENSIVE METABOLIC PANEL
ALT: 20 IU/L (ref 0–44)
AST: 24 IU/L (ref 0–40)
Albumin/Globulin Ratio: 1.3 (ref 1.2–2.2)
Albumin: 4 g/dL (ref 3.7–4.7)
Alkaline Phosphatase: 97 IU/L (ref 39–117)
BUN/Creatinine Ratio: 9 — ABNORMAL LOW (ref 10–24)
BUN: 10 mg/dL (ref 8–27)
Bilirubin Total: 0.7 mg/dL (ref 0.0–1.2)
CO2: 22 mmol/L (ref 20–29)
Calcium: 9.6 mg/dL (ref 8.6–10.2)
Chloride: 94 mmol/L — ABNORMAL LOW (ref 96–106)
Creatinine, Ser: 1.11 mg/dL (ref 0.76–1.27)
GFR calc Af Amer: 75 mL/min/{1.73_m2} (ref >59)
GFR calc non Af Amer: 65 mL/min/{1.73_m2} (ref >59)
Globulin, Total: 3 g/dL (ref 1.5–4.5)
Glucose: 96 mg/dL (ref 65–99)
Potassium: 5.1 mmol/L (ref 3.5–5.2)
Sodium: 131 mmol/L — ABNORMAL LOW (ref 134–144)
Total Protein: 7 g/dL (ref 6.0–8.5)

## 2019-10-12 LAB — CARDIOVASCULAR RISK ASSESSMENT

## 2019-10-12 LAB — URIC ACID: Uric Acid: 4.2 mg/dL (ref 3.8–8.4)

## 2019-10-12 NOTE — Progress Notes (Signed)
No anemia, kidney function normal, liver tests normal, Cholesterol normal, uric acid 4.2 good value lp

## 2019-10-28 ENCOUNTER — Ambulatory Visit: Payer: Medicare Other | Admitting: Cardiology

## 2019-10-30 DIAGNOSIS — Z23 Encounter for immunization: Secondary | ICD-10-CM | POA: Diagnosis not present

## 2019-11-11 ENCOUNTER — Telehealth: Payer: Self-pay | Admitting: Legal Medicine

## 2019-11-11 NOTE — Progress Notes (Signed)
Chronic Care Management   Note  11/11/2019 Name: Joseph Mercado MRN: [id number] DOB: May 23, 1946  Joseph Mercado is a 73 y.o. year old male who is a primary care patient of Abigail Miyamoto, MD. I reached out to Joseph Mercado by phone today in response to a referral sent by Joseph Mercado, Abigail Miyamoto, MD.   Joseph Mercado was given information about Chronic Care Management services today including:  1. CCM service includes personalized support from designated clinical staff supervised by his physician, including individualized plan of care and coordination with other care providers 2. 24/7 contact phone numbers for assistance for urgent and routine care needs. 3. Service will only be billed when office clinical staff spend 20 minutes or more in a month to coordinate care. 4. Only one practitioner may furnish and bill the service in a calendar month. 5. The patient may stop CCM services at any time (effective at the end of the month) by phone call to the office staff.   Patient did not agree to enrollment in care management services and does not wish to consider at this time.  Follow up plan:   Joseph Mercado Upstream Scheduler

## 2019-11-13 NOTE — Progress Notes (Signed)
Cardiology Office Note   Date:  11/18/2019   ID:  Joseph Mercado, DOB 06-22-1946, MRN [id number]  PCP:  Abigail Miyamoto, MD  Cardiologist:  Peter Swaziland MD  Chief Complaint  Patient presents with  . Coronary Artery Disease      History of Present Illness: Joseph Mercado is a 74 y.o. male who is seen for follow up CAD. He has a history of HTN and family history of CAD.  Seen in June 2018 with a several month history of progressive DOE and chest pain on exertion. Echo showed mild LVH with grade 2 diastolic dysfunction. Valves were OK. Myoview study showed inferolateral ischemia. This led to a cardiac cath on 03/10/17 which showed CTO of the LCx with collaterals to a fairly large OM. PCI was attempted but unable to cross completely into the large OM so unable to recanalize the vessel. It was noted that this was a long and difficult procedure with radiation dose of 4 gray. When seen in follow up he was still having some intermittent chest pain and after reviewing options decided to proceed with attempt at CTO PCI. This was performed on 05/14/17. We were unable to complete the procedure. We were able to cross the lesion in subintimal tract but never able to reenter the true lumen distally. He had no problems during the procedure but about 4 hours later developed acute hypotension and chest pain. Echo revealed a pericardial effusion. He underwent emergent pericardiocentesis with removal of 500 cc of blood. He stabilized and was observed overnight. No drainage from catheter and Echo showed minimal effusion. The catheter was withdrawn and the patient again developed acute hemodynamic instability. He was taken to the OR and a pericardial window was performed. There was no active bleeding site identified. His post op course was uncomplicated. He returned on 05/20/17 with recurrent chest pain. Troponin was mildly elevated with flat trend. Symptoms resolved with Ibuprofen. It was felt that symptoms were more  related to some pericardial irritation.   On follow up today he is feeling OK. He still notes he gets chest pain if his HR increases over 90. He does go to the gym daily and walks 20 minutes at 2 mi/hour. he can do house work and mowing but has to rest and pace himself. He has gained about 8 lbs.  He notes he takes pravastatin every other day. If he takes it every day he feels awful. No increase in chest pain or dyspnea.     Past Medical History:  Diagnosis Date  . Angina pectoris (HCC)   . CAD (coronary artery disease)    a. 02/2017: NSTEMI with cath showing 100% Prox Cx stenosis with 95% Mid Cx stenosis and 45% mid-LAD stenosis. Unsuccessful attempt at crossing an occluded mid nondominant circumflex --> plan for staged PCI in coming weeks  . Chest pain 02/2017  . Dyspnea   . GERD (gastroesophageal reflux disease)   . High cholesterol   . History of gout   . Hypertension   . Macular degeneration   . NSTEMI (non-ST elevated myocardial infarction) (HCC) 02/2017  . Pericardial effusion 04/2017    Past Surgical History:  Procedure Laterality Date  . CATARACT EXTRACTION W/ INTRAOCULAR LENS  IMPLANT, BILATERAL Bilateral   . CORONARY ANGIOPLASTY    . CORONARY BALLOON ANGIOPLASTY N/A 03/10/2017   Procedure: Coronary Balloon Angioplasty;  Surgeon: Runell Gess, MD;  Location: [facility] INVASIVE CV LAB;  Service: Cardiovascular;  Laterality: N/A;  CFX  .  CORONARY CTO INTERVENTION N/A 05/14/2017   Procedure: CORONARY CTO INTERVENTION;  Surgeon: Swaziland, Peter M, MD;  Location: [facility] INVASIVE CV LAB;  Service: Cardiovascular;  Laterality: N/A;  . LEFT HEART CATH AND CORONARY ANGIOGRAPHY N/A 03/10/2017   Procedure: Left Heart Cath and Coronary Angiography;  Surgeon: Runell Gess, MD;  Location: [facility] INVASIVE CV LAB;  Service: Cardiovascular;  Laterality: N/A;  . PERICARDIOCENTESIS N/A 05/14/2017   Procedure: PERICARDIOCENTESIS;  Surgeon: Swaziland, Peter M, MD;  Location: [facility] INVASIVE CV LAB;  Service:  Cardiovascular;  Laterality: N/A;  . SUBXYPHOID PERICARDIAL WINDOW N/A 05/15/2017   Procedure: SUBXYPHOID PERICARDIAL WINDOW WITH DRAINAGE OF PERICARDIAL EFFUSION;  Surgeon: Alleen Borne, MD;  Location: [facility] OR;  Service: Thoracic;  Laterality: N/A;  . ULTRASOUND GUIDANCE FOR VASCULAR ACCESS  05/14/2017   Procedure: Ultrasound Guidance For Vascular Access;  Surgeon: Swaziland, Peter M, MD;  Location: [facility] INVASIVE CV LAB;  Service: Cardiovascular;;     Current Outpatient Medications  Medication Sig Dispense Refill  . allopurinol (ZYLOPRIM) 100 MG tablet Take 100 mg by mouth 2 (two) times daily.    [name] aspirin 81 MG EC tablet Take 1 tablet (81 mg total) by mouth daily. Resume on 05/20/17. 30 tablet 12  . ezetimibe (ZETIA) 10 MG tablet Take 1 tablet (10 mg total) by mouth daily. 90 tablet 2  . isosorbide mononitrate (IMDUR) 30 MG 24 hr tablet TAKE 1 TABLET BY MOUTH DAILY 90 tablet 3  . lisinopril (ZESTRIL) 20 MG tablet Take 1 tablet (20 mg total) by mouth daily. 90 tablet 1  . metoprolol succinate (TOPROL-XL) 50 MG 24 hr tablet TAKE 1 TABLET BY MOUTH TWICE DAILY WITH FOOD 180 tablet 3  . Multiple Vitamins-Minerals (PRESERVISION AREDS PO) Take 1 capsule by mouth 2 (two) times daily.    . nitroGLYCERIN (NITROSTAT) 0.4 MG SL tablet Place 1 tablet (0.4 mg total) under the tongue every 5 (five) minutes as needed for chest pain. 25 tablet 6  . pantoprazole (PROTONIX) 40 MG tablet Take 40 mg by mouth 2 (two) times daily.     . pravastatin (PRAVACHOL) 20 MG tablet Take 20 mg by mouth daily. Per patient he takes this 1 po every other day     No current facility-administered medications for this visit.    Allergies:   Patient has no known allergies.    Social History:  The patient  reports that he has quit smoking. His smoking use included cigarettes. He has a 60.00 pack-year smoking history. He has quit using smokeless tobacco.  His smokeless tobacco use included chew. He reports current alcohol use of about  70.0 standard drinks of alcohol per week. He reports that he does not use drugs.   Family History:  The patient's family history includes CAD in his brother.    ROS:  Please see the history of present illness.   Otherwise, review of systems are positive for none.   All other systems are reviewed and negative.    PHYSICAL EXAM: VS:  BP (!) 164/82   Pulse 74   Ht 5\' 11"  (1.803 m)   Wt 246 lb (111.6 kg)   SpO2 97%   BMI 34.31 kg/m  , BMI Body mass index is 34.31 kg/m. GENERAL:  Well appearing obese WM in NAD HEENT:  PERRL, EOMI, sclera are clear. Oropharynx is clear. NECK:  No jugular venous distention, carotid upstroke brisk and symmetric, no bruits, no thyromegaly or adenopathy LUNGS:  Clear to auscultation bilaterally CHEST:  Unremarkable  HEART:  RRR,  PMI not displaced or sustained,S1 and S2 within normal limits, no S3, no S4: no clicks, no rubs, no murmurs ABD:  Soft, nontender. BS +, no masses or bruits. No hepatomegaly, no splenomegaly EXT:  2 + pulses throughout, no edema, no cyanosis no clubbing SKIN:  Warm and dry.  No rashes NEURO:  Alert and oriented x 3. Cranial nerves II through XII intact. PSYCH:  Cognitively intact     EKG:  EKG is not ordered today.    Recent Labs: 10/11/2019: ALT 20; BUN 10; Creatinine, Ser 1.11; Hemoglobin 13.9; Platelets 245; Potassium 5.1; Sodium 131    Lipid Panel    Component Value Date/Time   CHOL 154 10/11/2019 0819   TRIG 147 10/11/2019 0819   HDL 43 10/11/2019 0819   CHOLHDL 3.6 10/11/2019 0819   CHOLHDL 3.3 03/11/2017 0214   VLDL 14 03/11/2017 0214   LDLCALC 85 10/11/2019 0819    Labs dated 12/01/17: cholesterol 187, triglycerides 133, HDL 45, LDL 115. Creatinine 1.07. Otherwise CBC and chemistries normal. Dated 03/30/18: cholesterol 160, triglycerides 123, HDL 46, LDL 89. Creatinine 1.11, potassium 5.3. CBC and ALT normal.  Dated 03/31/19: cholesterol 148, triglycerides 99, HDL 41, LDL 87. Creatinine 1.09. CMET and CBC  normal.   Wt Readings from Last 3 Encounters:  11/18/19 246 lb (111.6 kg)  10/11/19 246 lb (111.6 kg)  04/30/19 238 lb 4 oz (108.1 kg)      Other studies Reviewed:  Echo 02/26/17: Study Conclusions  - Left ventricle: There was moderate concentric hypertrophy.   Systolic function was normal. The estimated ejection fraction was   in the range of 55% to 60%. Features are consistent with a   pseudonormal left ventricular filling pattern, with concomitant   abnormal relaxation and increased filling pressure (grade 2   diastolic dysfunction). Doppler parameters are consistent with   high ventricular filling pressure. - Left atrium: The atrium was mildly dilated. - Right ventricle: The cavity size was normal. Wall thickness was   normal. Systolic function was normal. - Tricuspid valve: There was trivial regurgitation. - Pulmonary arteries: Systolic pressure was within the normal   range. PA peak pressure: 27 mm Hg (S). - Pericardium, extracardiac: A trivial pericardial effusion was   identified. - Global longitudinal strain -18.3%.  Myoview 02/26/17: Study Highlights    Inferior (basal) and inferolateral (base, mid) thinning with mild improvement in recovery Does not appear to be significant for ischemia probably reflects soft tissue attenuation (attenuation)  Nuclear stress EF: 61%.  Low risk scan   Procedures   CORONARY CTO INTERVENTION  Conclusion     Mid LAD lesion, 45 %stenosed.  Mid Cx lesion, 100 %stenosed.  Post intervention, there is a 100% residual stenosis.  Prox Cx to Mid Cx lesion, 95 %stenosed.  Post intervention, there is a 30% residual stenosis.   1. Unsuccessful CTO PCI of the LCx. There is some improvement of flow into the small OM with PTCA.    Plan: continue medical management. Will follow and see how he does with medical therapy.    Echo 05/21/17: Study Conclusions  - Left ventricle: The cavity size was normal. Wall thickness was    increased in a pattern of mild LVH. Systolic function was normal.   The estimated ejection fraction was in the range of 60% to 65%.   Although no diagnostic regional wall motion abnormality was   identified, this possibility cannot be completely excluded on the   basis of this  study. Features are consistent with a pseudonormal   left ventricular filling pattern, with concomitant abnormal   relaxation and increased filling pressure (grade 2 diastolic   dysfunction). - Aortic valve: There was no stenosis. - Mitral valve: There was mild regurgitation. - Right ventricle: The cavity size was normal. Systolic function   was normal. - Pulmonary arteries: No complete TR doppler jet so unable to   estimate PA systolic pressure. - Systemic veins: IVC not visualized. - Pericardium, extracardiac: There was no pericardial effusion.  Impressions:  - Normal LV size with mild LV hypertrophy. EF 60-65%. Moderate   diastolic dysfunction. Normal RV size and systolic function. Mild   MR.  ASSESSMENT AND PLAN:  1.  CAD with CTO of the LCx supplying one large OM branch. S/p failed attempt at CTO PCI with complication of perforation with acute pericardial tamponade requiring a pericardial window.  He has stable class 2 angina.  Will continue metoprolol and Imdur. Use Ntg sl prn. Continue ASA. Continue exercise program. I think he would really benefit with weight loss. We discussed increasing his Toprol to blunt his HR more but he is comfortable with his current therapy.  2. HTN - elevated today but has been controlled.   3. HLD On Zetia and pravastatin every other day. Unable to tolerate higher doses.  LDL 87.   Current medicines are reviewed at length with the patient today.  The patient does not have concerns regarding medicines.  Follow up 6 months   Signed, Peter Swaziland, MD  11/18/2019 10:49 AM    [facility] [address], [place], [place], [address] Phone [phone],  Fax [phone]

## 2019-11-18 ENCOUNTER — Other Ambulatory Visit: Payer: Self-pay

## 2019-11-18 ENCOUNTER — Ambulatory Visit (INDEPENDENT_AMBULATORY_CARE_PROVIDER_SITE_OTHER): Payer: Medicare Other | Admitting: Cardiology

## 2019-11-18 ENCOUNTER — Encounter: Payer: Self-pay | Admitting: Cardiology

## 2019-11-18 VITALS — BP 164/82 | HR 74 | Ht 71.0 in | Wt 246.0 lb

## 2019-11-18 DIAGNOSIS — I25119 Atherosclerotic heart disease of native coronary artery with unspecified angina pectoris: Secondary | ICD-10-CM

## 2019-11-18 DIAGNOSIS — E785 Hyperlipidemia, unspecified: Secondary | ICD-10-CM | POA: Diagnosis not present

## 2019-11-18 DIAGNOSIS — I10 Essential (primary) hypertension: Secondary | ICD-10-CM

## 2019-12-14 ENCOUNTER — Other Ambulatory Visit: Payer: Self-pay

## 2019-12-14 MED ORDER — PANTOPRAZOLE SODIUM 40 MG PO TBEC: 40.0000 mg | DELAYED_RELEASE_TABLET | Freq: Every day | ORAL | 1 refills | Status: DC

## 2019-12-15 ENCOUNTER — Other Ambulatory Visit: Payer: Self-pay | Admitting: Legal Medicine

## 2020-03-19 DIAGNOSIS — R110 Nausea: Secondary | ICD-10-CM | POA: Diagnosis not present

## 2020-03-19 DIAGNOSIS — I10 Essential (primary) hypertension: Secondary | ICD-10-CM | POA: Diagnosis not present

## 2020-03-19 DIAGNOSIS — R9082 White matter disease, unspecified: Secondary | ICD-10-CM | POA: Diagnosis not present

## 2020-03-19 DIAGNOSIS — J90 Pleural effusion, not elsewhere classified: Secondary | ICD-10-CM | POA: Diagnosis not present

## 2020-03-19 DIAGNOSIS — R55 Syncope and collapse: Secondary | ICD-10-CM | POA: Diagnosis not present

## 2020-03-19 DIAGNOSIS — R0902 Hypoxemia: Secondary | ICD-10-CM | POA: Diagnosis not present

## 2020-03-19 DIAGNOSIS — R61 Generalized hyperhidrosis: Secondary | ICD-10-CM | POA: Diagnosis not present

## 2020-03-26 ENCOUNTER — Other Ambulatory Visit: Payer: Self-pay | Admitting: Cardiology

## 2020-03-27 ENCOUNTER — Encounter: Payer: Self-pay | Admitting: Legal Medicine

## 2020-03-27 ENCOUNTER — Ambulatory Visit (INDEPENDENT_AMBULATORY_CARE_PROVIDER_SITE_OTHER): Payer: Medicare Other | Admitting: Legal Medicine

## 2020-03-27 ENCOUNTER — Other Ambulatory Visit: Payer: Self-pay

## 2020-03-27 VITALS — BP 112/68 | HR 72 | Temp 97.2°F | Resp 16 | Ht 71.0 in | Wt 238.0 lb

## 2020-03-27 DIAGNOSIS — J449 Chronic obstructive pulmonary disease, unspecified: Secondary | ICD-10-CM

## 2020-03-27 DIAGNOSIS — E785 Hyperlipidemia, unspecified: Secondary | ICD-10-CM | POA: Diagnosis not present

## 2020-03-27 DIAGNOSIS — I10 Essential (primary) hypertension: Secondary | ICD-10-CM | POA: Diagnosis not present

## 2020-03-27 DIAGNOSIS — I25119 Atherosclerotic heart disease of native coronary artery with unspecified angina pectoris: Secondary | ICD-10-CM

## 2020-03-27 DIAGNOSIS — M1A00X0 Idiopathic chronic gout, unspecified site, without tophus (tophi): Secondary | ICD-10-CM | POA: Diagnosis not present

## 2020-03-27 NOTE — Progress Notes (Signed)
Subjective:  Patient ID: Joseph Mercado, male    DOB: [birth year]  Age: 74 y.o. MRN: [id number]  Chief Complaint  Patient presents with  . Hypertension  . Hyperlipidemia  . Loss of Consciousness  . Nausea    HPI: follow up from ER with nausea and vomiting.  Negative workup. No LOC, they were unable to get blood in ER.  Patient presents for follow up of hypertension.  Patient tolerating metoprolol well with side effects.  Patient was diagnosed with hypertension 2010 so has been treated for hypertension for 10 years.Patient is working on maintaining diet and exercise regimen and follows up as directed. Complication include cad.  Patient presents with hyperlipidemia.  Compliance with treatment has been good; patient takes medicines as directed, maintains low cholesterol diet, follows up as directed, and maintains exercise regimen.  Patient is using pravastatin, zetia without problems.  CORONARY ARTERY DISEASE  Patient presents in follow up of CAD. Patient was diagnosed in 2018. The patient has no associated CHF. The patient is currently taking a beta blocker, statin, and aspirin. CAD was diagnosed  years ago.  Patient is having no angina. Patient has used no NTG.  Patient is followed by cardiology.  Patient had none . Last angiography was 2018, last echocardiogram unknown  He has healing skin abrASION EFT ARM.   Current Outpatient Medications on File Prior to Visit  Medication Sig Dispense Refill  . allopurinol (ZYLOPRIM) 100 MG tablet TAKE 1 TABLET(100 MG) BY MOUTH TWICE DAILY 180 tablet 2  . aspirin 81 MG EC tablet Take 1 tablet (81 mg total) by mouth daily. Resume on 05/20/17. 30 tablet 12  . ezetimibe (ZETIA) 10 MG tablet Take 1 tablet (10 mg total) by mouth daily. 90 tablet 2  . isosorbide mononitrate (IMDUR) 30 MG 24 hr tablet TAKE 1 TABLET BY MOUTH DAILY 90 tablet 3  . metoprolol succinate (TOPROL-XL) 50 MG 24 hr tablet TAKE 1 TABLET BY MOUTH TWICE DAILY WITH FOOD 180 tablet 3  .  Multiple Vitamins-Minerals (PRESERVISION AREDS PO) Take 1 capsule by mouth 2 (two) times daily.    . nitroGLYCERIN (NITROSTAT) 0.4 MG SL tablet Place 1 tablet (0.4 mg total) under the tongue every 5 (five) minutes as needed for chest pain. 25 tablet 6  . pantoprazole (PROTONIX) 40 MG tablet Take 1 tablet (40 mg total) by mouth daily. 90 tablet 1  . pravastatin (PRAVACHOL) 20 MG tablet Take 20 mg by mouth daily. Per patient he takes this 1 po every other day    . lisinopril (ZESTRIL) 20 MG tablet TAKE 1 TABLET(20 MG) BY MOUTH DAILY 90 tablet 1   No current facility-administered medications on file prior to visit.   Past Medical History:  Diagnosis Date  . Angina pectoris (HCC)   . CAD (coronary artery disease)    a. 02/2017: NSTEMI with cath showing 100% Prox Cx stenosis with 95% Mid Cx stenosis and 45% mid-LAD stenosis. Unsuccessful attempt at crossing an occluded mid nondominant circumflex --> plan for staged PCI in coming weeks  . Chest pain 02/2017  . Dyspnea   . GERD (gastroesophageal reflux disease)   . High cholesterol   . History of gout   . Hypertension   . Macular degeneration   . NSTEMI (non-ST elevated myocardial infarction) (HCC) 02/2017  . Pericardial effusion 04/2017   Past Surgical History:  Procedure Laterality Date  . CATARACT EXTRACTION W/ INTRAOCULAR LENS  IMPLANT, BILATERAL Bilateral   . CORONARY ANGIOPLASTY    .  CORONARY BALLOON ANGIOPLASTY N/A 03/10/2017   Procedure: Coronary Balloon Angioplasty;  Surgeon: Runell Gess, MD;  Location: [facility] INVASIVE CV LAB;  Service: Cardiovascular;  Laterality: N/A;  CFX  . CORONARY CTO INTERVENTION N/A 05/14/2017   Procedure: CORONARY CTO INTERVENTION;  Surgeon: Swaziland, Peter M, MD;  Location: [facility] INVASIVE CV LAB;  Service: Cardiovascular;  Laterality: N/A;  . LEFT HEART CATH AND CORONARY ANGIOGRAPHY N/A 03/10/2017   Procedure: Left Heart Cath and Coronary Angiography;  Surgeon: Runell Gess, MD;  Location: [facility] INVASIVE CV  LAB;  Service: Cardiovascular;  Laterality: N/A;  . PERICARDIOCENTESIS N/A 05/14/2017   Procedure: PERICARDIOCENTESIS;  Surgeon: Swaziland, Peter M, MD;  Location: [facility] INVASIVE CV LAB;  Service: Cardiovascular;  Laterality: N/A;  . SUBXYPHOID PERICARDIAL WINDOW N/A 05/15/2017   Procedure: SUBXYPHOID PERICARDIAL WINDOW WITH DRAINAGE OF PERICARDIAL EFFUSION;  Surgeon: Alleen Borne, MD;  Location: [facility] OR;  Service: Thoracic;  Laterality: N/A;  . ULTRASOUND GUIDANCE FOR VASCULAR ACCESS  05/14/2017   Procedure: Ultrasound Guidance For Vascular Access;  Surgeon: Swaziland, Peter M, MD;  Location: [facility] INVASIVE CV LAB;  Service: Cardiovascular;;    Family History  Problem Relation Age of Onset  . CAD Brother        CABG in his 72's   Social History   Socioeconomic History  . Marital status: Divorced    Spouse name: Not on file  . Number of children: Not on file  . Years of education: Not on file  . Highest education level: Not on file  Occupational History  . Not on file  Tobacco Use  . Smoking status: Former Smoker    Packs/day: 3.00    Years: 20.00    Pack years: 60.00    Types: Cigarettes  . Smokeless tobacco: Former Neurosurgeon    Types: Chew  . Tobacco comment: "quit smoking in the 1980s; chewed 4-5 years after I quit smoking"  Vaping Use  . Vaping Use: Never used  Substance and Sexual Activity  . Alcohol use: Yes    Alcohol/week: 70.0 standard drinks    Types: 70 Cans of beer per week    Comment: 05/14/2017 "6-10 beers/day"  . Drug use: No  . Sexual activity: Not on file  Other Topics Concern  . Not on file  Social History Narrative  . Not on file   Social Determinants of Health   Financial Resource Strain:   . Difficulty of Paying Living Expenses:   Food Insecurity:   . Worried About Programme researcher, broadcasting/film/video in the Last Year:   . Barista in the Last Year:   Transportation Needs:   . Freight forwarder (Medical):   [name] Lack of Transportation (Non-Medical):   Physical  Activity:   . Days of Exercise per Week:   . Minutes of Exercise per Session:   Stress:   . Feeling of Stress :   Social Connections:   . Frequency of Communication with Friends and Family:   . Frequency of Social Gatherings with Friends and Family:   . Attends Religious Services:   . Active Member of Clubs or Organizations:   . Attends Banker Meetings:   [name] Marital Status:     Review of Systems  Constitutional: Negative.   HENT: Negative.   Eyes: Negative.   Respiratory: Negative.   Cardiovascular: Negative.   Gastrointestinal: Negative.   Genitourinary: Negative.   Musculoskeletal: Negative.   Skin: Positive for wound.  Neurological: Negative.  Psychiatric/Behavioral: Negative.      Objective:  BP 112/68   Pulse 72   Temp (!) 97.2 F (36.2 C)   Resp 16   Ht 5\' 11"  (1.803 m)   Wt 238 lb (108 kg)   SpO2 96%   BMI 33.19 kg/m   BP/Weight 03/27/2020 11/18/2019 10/11/2019  Systolic BP 112 164 138  Diastolic BP 68 82 82  Wt. (Lbs) 238 246 246  BMI 33.19 34.31 34.31    Physical Exam Vitals reviewed.  Constitutional:      Appearance: Normal appearance.  HENT:     Head: Normocephalic and atraumatic.     Right Ear: Tympanic membrane, ear canal and external ear normal.     Left Ear: Tympanic membrane, ear canal and external ear normal.     Nose: Nose normal.     Mouth/Throat:     Mouth: Mucous membranes are moist.     Pharynx: Oropharynx is clear.  Eyes:     Conjunctiva/sclera: Conjunctivae normal.     Pupils: Pupils are equal, round, and reactive to light.  Cardiovascular:     Rate and Rhythm: Normal rate and regular rhythm.     Pulses: Normal pulses.     Heart sounds: Normal heart sounds.  Pulmonary:     Effort: Pulmonary effort is normal.     Breath sounds: Normal breath sounds.  Abdominal:     General: Abdomen is flat. Bowel sounds are normal.     Palpations: Abdomen is soft.  Musculoskeletal:        General: Normal range of motion.      Cervical back: Normal range of motion and neck supple.  Skin:    General: Skin is warm and dry.     Capillary Refill: Capillary refill takes less than 2 seconds.     Findings: Bruising present.  Neurological:     General: No focal deficit present.     Mental Status: He is alert and oriented to person, place, and time. Mental status is at baseline.  Psychiatric:        Mood and Affect: Mood normal.        Thought Content: Thought content normal.        Judgment: Judgment normal.       Lab Results  Component Value Date   WBC 10.2 10/11/2019   HGB 13.9 10/11/2019   HCT 44.6 10/11/2019   PLT 245 10/11/2019   GLUCOSE 96 10/11/2019   CHOL 154 10/11/2019   TRIG 147 10/11/2019   HDL 43 10/11/2019   LDLCALC 85 10/11/2019   ALT 20 10/11/2019   AST 24 10/11/2019   NA 131 (L) 10/11/2019   K 5.1 10/11/2019   CL 94 (L) 10/11/2019   CREATININE 1.11 10/11/2019   BUN 10 10/11/2019   CO2 22 10/11/2019   TSH 3.881 05/20/2017   INR 1.11 05/20/2017   HGBA1C 4.7 (L) 05/20/2017      Assessment & Plan:   1. Hyperlipidemia LDL goal <70 - Lipid panel AN INDIVIDUAL CARE PLAN for hyperlipidemia/ cholesterol was established and reinforced today.  The patient's status was assessed using clinical findings on exam, lab and other diagnostic tests. The patient's disease status was assessed based on evidence-based guidelines and found to be well controlled. MEDICATIONS were reviewed. SELF MANAGEMENT GOALS have been discussed and patient's success at attaining the goal of low cholesterol was assessed. RECOMMENDATION given include regular exercise 3 days a week and low cholesterol/low fat diet. CLINICAL SUMMARY including written  plan to identify barriers unique to the patient due to social or economic  reasons was discussed.  2. Essential hypertension - CBC with Differential/Platelet - Comprehensive metabolic panel An individual hypertension care plan was established and reinforced today.  The  patient's status was assessed using clinical findings on exam and labs or diagnostic tests. The patient's success at meeting treatment goals on disease specific evidence-based guidelines and found to be well controlled. SELF MANAGEMENT: The patient and I together assessed ways to personally work towards obtaining the recommended goals. RECOMMENDATIONS: avoid decongestants found in common cold remedies, decrease consumption of alcohol, perform routine monitoring of BP with home BP cuff, exercise, reduction of dietary salt, take medicines as prescribed, try not to miss doses and quit smoking.  Regular exercise and maintaining a healthy weight is needed.  Stress reduction may help. A CLINICAL SUMMARY including written plan identify barriers to care unique to individual due to social or financial issues.  We attempt to mutually creat solutions for individual and family understanding.  3. Obstructive chronic bronchitis without exacerbation (HCC) An individualize plan was formulated for care of COPD.  Treatment is evidence based.  She will continue on inhalers, avoid smoking and smoke.  Regular exercise with help with dyspnea. Routine follow ups and medication compliance is needed.  4. Coronary artery disease involving native coronary artery of native heart with angina pectoris (HCC) Patient's CAD was assessed using history and physical along with other information to maximize treatment.  Evidence based criteria was use in deciding proper management for this disease process.  Patient's CAD is under good control.therapy continue present medicine. 5. Chronic gouty arthritis AN INDIVIDUAL CARE PLAN for goutwas established and reinforced today.  The patient's status was assessed using clinical findings on exam, labs, and other diagnostic testing. Patient's success at meeting treatment goals based on disease specific evidence-bassed guidelines and found to be in good control. RECOMMENDATIONS include maintaining  present medicines and treatment.      Orders Placed This Encounter  Procedures  . CBC with Differential/Platelet  . Comprehensive metabolic panel  . Lipid panel     Follow-up: Return in about 6 months (around 09/27/2020) for fasting.  An After Visit Summary was printed and given to the patient.  [facility] [phone]

## 2020-03-28 ENCOUNTER — Other Ambulatory Visit: Payer: Self-pay

## 2020-03-28 DIAGNOSIS — E875 Hyperkalemia: Secondary | ICD-10-CM

## 2020-03-28 LAB — CBC WITH DIFFERENTIAL/PLATELET
Basophils Absolute: 0.1 10*3/uL (ref 0.0–0.2)
Basos: 1 %
EOS (ABSOLUTE): 0.2 10*3/uL (ref 0.0–0.4)
Eos: 2 %
Hematocrit: 42.5 % (ref 37.5–51.0)
Hemoglobin: 12.9 g/dL — ABNORMAL LOW (ref 13.0–17.7)
Immature Grans (Abs): 0 10*3/uL (ref 0.0–0.1)
Immature Granulocytes: 0 %
Lymphocytes Absolute: 2.6 10*3/uL (ref 0.7–3.1)
Lymphs: 22 %
MCH: 25.6 pg — ABNORMAL LOW (ref 26.6–33.0)
MCHC: 30.4 g/dL — ABNORMAL LOW (ref 31.5–35.7)
MCV: 84 fL (ref 79–97)
Monocytes Absolute: 0.9 10*3/uL (ref 0.1–0.9)
Monocytes: 8 %
Neutrophils Absolute: 7.8 10*3/uL — ABNORMAL HIGH (ref 1.4–7.0)
Neutrophils: 67 %
Platelets: 250 10*3/uL (ref 150–450)
RBC: 5.04 x10E6/uL (ref 4.14–5.80)
RDW: 14.5 % (ref 11.6–15.4)
WBC: 11.5 10*3/uL — ABNORMAL HIGH (ref 3.4–10.8)

## 2020-03-28 LAB — COMPREHENSIVE METABOLIC PANEL
ALT: 15 IU/L (ref 0–44)
AST: 18 IU/L (ref 0–40)
Albumin/Globulin Ratio: 1.5 (ref 1.2–2.2)
Albumin: 4.3 g/dL (ref 3.7–4.7)
Alkaline Phosphatase: 104 IU/L (ref 48–121)
BUN/Creatinine Ratio: 9 — ABNORMAL LOW (ref 10–24)
BUN: 10 mg/dL (ref 8–27)
Bilirubin Total: 0.8 mg/dL (ref 0.0–1.2)
CO2: 25 mmol/L (ref 20–29)
Calcium: 9.6 mg/dL (ref 8.6–10.2)
Chloride: 94 mmol/L — ABNORMAL LOW (ref 96–106)
Creatinine, Ser: 1.07 mg/dL (ref 0.76–1.27)
GFR calc Af Amer: 79 mL/min/{1.73_m2} (ref >59)
GFR calc non Af Amer: 68 mL/min/{1.73_m2} (ref >59)
Globulin, Total: 2.8 g/dL (ref 1.5–4.5)
Glucose: 103 mg/dL — ABNORMAL HIGH (ref 65–99)
Potassium: 5.4 mmol/L — ABNORMAL HIGH (ref 3.5–5.2)
Sodium: 130 mmol/L — ABNORMAL LOW (ref 134–144)
Total Protein: 7.1 g/dL (ref 6.0–8.5)

## 2020-03-28 LAB — LIPID PANEL
Chol/HDL Ratio: 3.9 ratio (ref 0.0–5.0)
Cholesterol, Total: 152 mg/dL (ref 100–199)
HDL: 39 mg/dL — ABNORMAL LOW (ref >39)
LDL Chol Calc (NIH): 89 mg/dL (ref 0–99)
Triglycerides: 135 mg/dL (ref 0–149)
VLDL Cholesterol Cal: 24 mg/dL (ref 5–40)

## 2020-03-28 LAB — CARDIOVASCULAR RISK ASSESSMENT

## 2020-03-28 NOTE — Progress Notes (Signed)
Wbc high,  anemia almost normal rbc counts, Glucose 103, kidney tests high, potassium 5.4 high, recheck 1 week, liver tests normal, lipid normal lp

## 2020-04-04 ENCOUNTER — Other Ambulatory Visit: Payer: Self-pay

## 2020-04-04 ENCOUNTER — Other Ambulatory Visit: Payer: Medicare Other

## 2020-04-04 DIAGNOSIS — E875 Hyperkalemia: Secondary | ICD-10-CM | POA: Diagnosis not present

## 2020-04-04 LAB — COMPREHENSIVE METABOLIC PANEL
ALT: 16 IU/L (ref 0–44)
AST: 20 IU/L (ref 0–40)
Albumin/Globulin Ratio: 1.8 (ref 1.2–2.2)
Albumin: 4.2 g/dL (ref 3.7–4.7)
Alkaline Phosphatase: 101 IU/L (ref 48–121)
BUN/Creatinine Ratio: 9 — ABNORMAL LOW (ref 10–24)
BUN: 9 mg/dL (ref 8–27)
Bilirubin Total: 0.4 mg/dL (ref 0.0–1.2)
CO2: 25 mmol/L (ref 20–29)
Calcium: 8.9 mg/dL (ref 8.6–10.2)
Chloride: 96 mmol/L (ref 96–106)
Creatinine, Ser: 1.03 mg/dL (ref 0.76–1.27)
GFR calc Af Amer: 82 mL/min/{1.73_m2} (ref >59)
GFR calc non Af Amer: 71 mL/min/{1.73_m2} (ref >59)
Globulin, Total: 2.3 g/dL (ref 1.5–4.5)
Glucose: 102 mg/dL — ABNORMAL HIGH (ref 65–99)
Potassium: 5 mmol/L (ref 3.5–5.2)
Sodium: 130 mmol/L — ABNORMAL LOW (ref 134–144)
Total Protein: 6.5 g/dL (ref 6.0–8.5)

## 2020-04-05 NOTE — Progress Notes (Signed)
Glucose 102, kidney tests normal, liver tests normal, potassium normal lp

## 2020-04-07 ENCOUNTER — Other Ambulatory Visit: Payer: Self-pay | Admitting: Legal Medicine

## 2020-04-10 ENCOUNTER — Ambulatory Visit: Payer: Medicare Other | Admitting: Legal Medicine

## 2020-04-25 ENCOUNTER — Other Ambulatory Visit: Payer: Self-pay | Admitting: Cardiology

## 2020-05-04 DIAGNOSIS — H353131 Nonexudative age-related macular degeneration, bilateral, early dry stage: Secondary | ICD-10-CM | POA: Diagnosis not present

## 2020-05-04 DIAGNOSIS — H26493 Other secondary cataract, bilateral: Secondary | ICD-10-CM | POA: Diagnosis not present

## 2020-05-09 DIAGNOSIS — L578 Other skin changes due to chronic exposure to nonionizing radiation: Secondary | ICD-10-CM | POA: Diagnosis not present

## 2020-05-09 DIAGNOSIS — D485 Neoplasm of uncertain behavior of skin: Secondary | ICD-10-CM | POA: Diagnosis not present

## 2020-05-09 DIAGNOSIS — L821 Other seborrheic keratosis: Secondary | ICD-10-CM | POA: Diagnosis not present

## 2020-05-09 DIAGNOSIS — L570 Actinic keratosis: Secondary | ICD-10-CM | POA: Diagnosis not present

## 2020-05-10 NOTE — Progress Notes (Signed)
Cardiology Office Note   Date:  05/16/2020   ID:  Joseph Mercado, DOB May 27, 1946, MRN [id number]  PCP:  Abigail Miyamoto, MD  Cardiologist:  Joeziah Voit [place] MD  Chief Complaint  Patient presents with  . Coronary Artery Disease      History of Present Illness: Joseph Mercado is a 74 y.o. male who is seen for follow up CAD. He has a history of HTN and family history of CAD.  Seen in June 2018 with a several month history of progressive DOE and chest pain on exertion. Echo showed mild LVH with grade 2 diastolic dysfunction. Valves were OK. Myoview study showed inferolateral ischemia. This led to a cardiac cath on 03/10/17 which showed CTO of the LCx with collaterals to a fairly large OM. PCI was attempted but unable to cross completely into the large OM so unable to recanalize the vessel. It was noted that this was a long and difficult procedure with radiation dose of 4 gray. When seen in follow up he was still having some intermittent chest pain and after reviewing options decided to proceed with attempt at CTO PCI. This was performed on 05/14/17. We were unable to complete the procedure. We were able to cross the lesion in subintimal tract but never able to reenter the true lumen distally. He had no problems during the procedure but about 4 hours later developed acute hypotension and chest pain. Echo revealed a pericardial effusion. He underwent emergent pericardiocentesis with removal of 500 cc of blood. He stabilized and was observed overnight. No drainage from catheter and Echo showed minimal effusion. The catheter was withdrawn and the patient again developed acute hemodynamic instability. He was taken to the OR and a pericardial window was performed. There was no active bleeding site identified. His post op course was uncomplicated. He returned on 05/20/17 with recurrent chest pain. Troponin was mildly elevated with flat trend. Symptoms resolved with Ibuprofen. It was felt that symptoms were more  related to some pericardial irritation.   On follow up today he is doing OK. States he has learned his limitations. He is walking a mile a day and doing his mowing and work around the house. If he works more than 30 minutes he may get some chest discomfort and stops and rests.    He notes he takes pravastatin every other day. If he takes it every day he feels awful. No increase in chest pain or dyspnea. He did stop taking ASA because of bruising in his arms.     Past Medical History:  Diagnosis Date  . Angina pectoris (HCC)   . CAD (coronary artery disease)    a. 02/2017: NSTEMI with cath showing 100% Prox Cx stenosis with 95% Mid Cx stenosis and 45% mid-LAD stenosis. Unsuccessful attempt at crossing an occluded mid nondominant circumflex --> plan for staged PCI in coming weeks  . Chest pain 02/2017  . Dyspnea   . GERD (gastroesophageal reflux disease)   . High cholesterol   . History of gout   . Hypertension   . Macular degeneration   . NSTEMI (non-ST elevated myocardial infarction) (HCC) 02/2017  . Pericardial effusion 04/2017    Past Surgical History:  Procedure Laterality Date  . CATARACT EXTRACTION W/ INTRAOCULAR LENS  IMPLANT, BILATERAL Bilateral   . CORONARY ANGIOPLASTY    . CORONARY BALLOON ANGIOPLASTY N/A 03/10/2017   Procedure: Coronary Balloon Angioplasty;  Surgeon: Runell Gess, MD;  Location: [facility] INVASIVE CV LAB;  Service: Cardiovascular;  Laterality:  N/A;  CFX  . CORONARY CTO INTERVENTION N/A 05/14/2017   Procedure: CORONARY CTO INTERVENTION;  Surgeon: [place], Jessy Cybulski M, MD;  Location: [facility] INVASIVE CV LAB;  Service: Cardiovascular;  Laterality: N/A;  . LEFT HEART CATH AND CORONARY ANGIOGRAPHY N/A 03/10/2017   Procedure: Left Heart Cath and Coronary Angiography;  Surgeon: Runell Gess, MD;  Location: [facility] INVASIVE CV LAB;  Service: Cardiovascular;  Laterality: N/A;  . PERICARDIOCENTESIS N/A 05/14/2017   Procedure: PERICARDIOCENTESIS;  Surgeon: [place], Rashema Seawright M, MD;   Location: [facility] INVASIVE CV LAB;  Service: Cardiovascular;  Laterality: N/A;  . SUBXYPHOID PERICARDIAL WINDOW N/A 05/15/2017   Procedure: SUBXYPHOID PERICARDIAL WINDOW WITH DRAINAGE OF PERICARDIAL EFFUSION;  Surgeon: Alleen Borne, MD;  Location: [facility] OR;  Service: Thoracic;  Laterality: N/A;  . ULTRASOUND GUIDANCE FOR VASCULAR ACCESS  05/14/2017   Procedure: Ultrasound Guidance For Vascular Access;  Surgeon: [place], Giulian Goldring M, MD;  Location: [facility] INVASIVE CV LAB;  Service: Cardiovascular;;     Current Outpatient Medications  Medication Sig Dispense Refill  . allopurinol (ZYLOPRIM) 100 MG tablet TAKE 1 TABLET(100 MG) BY MOUTH TWICE DAILY 180 tablet 2  . aspirin 81 MG EC tablet Take 1 tablet (81 mg total) by mouth daily. Resume on 05/20/17. 30 tablet 12  . ezetimibe (ZETIA) 10 MG tablet TAKE 1 TABLET(10 MG) BY MOUTH DAILY 90 tablet 2  . isosorbide mononitrate (IMDUR) 30 MG 24 hr tablet TAKE 1 TABLET BY MOUTH DAILY 90 tablet 3  . lisinopril (ZESTRIL) 20 MG tablet TAKE 1 TABLET(20 MG) BY MOUTH DAILY 90 tablet 1  . metoprolol succinate (TOPROL-XL) 50 MG 24 hr tablet TAKE 1 TABLET BY MOUTH TWICE DAILY WITH FOOD 180 tablet 3  . Multiple Vitamins-Minerals (PRESERVISION AREDS PO) Take 1 capsule by mouth 2 (two) times daily.    . nitroGLYCERIN (NITROSTAT) 0.4 MG SL tablet Place 1 tablet (0.4 mg total) under the tongue every 5 (five) minutes as needed for chest pain. 25 tablet 6  . pantoprazole (PROTONIX) 40 MG tablet TAKE 1 TABLET BY MOUTH TWICE DAILY 180 tablet 2  . pravastatin (PRAVACHOL) 20 MG tablet Take 20 mg by mouth daily. Per patient he takes this 1 po every other day     No current facility-administered medications for this visit.    Allergies:   Patient has no known allergies.    Social History:  The patient  reports that he has quit smoking. His smoking use included cigarettes. He has a 60.00 pack-year smoking history. He has quit using smokeless tobacco.  His smokeless tobacco use included chew.  He reports current alcohol use of about 70.0 standard drinks of alcohol per week. He reports that he does not use drugs.   Family History:  The patient's family history includes CAD in his brother.    ROS:  Please see the history of present illness.   Otherwise, review of systems are positive for none.   All other systems are reviewed and negative.    PHYSICAL EXAM: VS:  BP 132/70   Pulse 74   Ht 5\' 11"  (1.803 m)   Wt 240 lb (108.9 kg)   SpO2 98%   BMI 33.47 kg/m  , BMI Body mass index is 33.47 kg/m. GENERAL:  Well appearing obese WM in NAD HEENT:  PERRL, EOMI, sclera are clear. Oropharynx is clear. NECK:  No jugular venous distention, carotid upstroke brisk and symmetric, no bruits, no thyromegaly or adenopathy LUNGS:  Clear to auscultation bilaterally CHEST:  Unremarkable HEART:  RRR,  PMI not displaced or sustained,S1 and S2 within normal limits, no S3, no S4: no clicks, no rubs, no murmurs ABD:  Soft, nontender. BS +, no masses or bruits. No hepatomegaly, no splenomegaly EXT:  2 + pulses throughout, no edema, no cyanosis no clubbing SKIN:  Warm and dry.  No rashes NEURO:  Alert and oriented x 3. Cranial nerves II through XII intact. PSYCH:  Cognitively intact     EKG:  EKG is  ordered today. NSR with LAFB, incomplete RBBB. Poor R wave progression in the precordial leads. I have personally reviewed and interpreted this study.     Recent Labs: 03/27/2020: Hemoglobin 12.9; Platelets 250 04/04/2020: ALT 16; BUN 9; Creatinine, Ser 1.03; Potassium 5.0; Sodium 130    Lipid Panel    Component Value Date/Time   CHOL 152 03/27/2020 1018   TRIG 135 03/27/2020 1018   HDL 39 (L) 03/27/2020 1018   CHOLHDL 3.9 03/27/2020 1018   CHOLHDL 3.3 03/11/2017 0214   VLDL 14 03/11/2017 0214   LDLCALC 89 03/27/2020 1018    Labs dated 12/01/17: cholesterol 187, triglycerides 133, HDL 45, LDL 115. Creatinine 1.07. Otherwise CBC and chemistries normal. Dated 03/30/18: cholesterol 160,  triglycerides 123, HDL 46, LDL 89. Creatinine 1.11, potassium 5.3. CBC and ALT normal.  Dated 03/31/19: cholesterol 148, triglycerides 99, HDL 41, LDL 87. Creatinine 1.09. CMET and CBC normal. Dated 03/27/20: WBC 11.5, Hgb 12.9. glucose 103. Sodium 130, potassium 5.4. cholesterol 152, triglycerides 135, HDL 39, LDL 89.    Wt Readings from Last 3 Encounters:  05/16/20 240 lb (108.9 kg)  03/27/20 238 lb (108 kg)  11/18/19 246 lb (111.6 kg)      Other studies Reviewed:  Echo 02/26/17: Study Conclusions  - Left ventricle: There was moderate concentric hypertrophy.   Systolic function was normal. The estimated ejection fraction was   in the range of 55% to 60%. Features are consistent with a   pseudonormal left ventricular filling pattern, with concomitant   abnormal relaxation and increased filling pressure (grade 2   diastolic dysfunction). Doppler parameters are consistent with   high ventricular filling pressure. - Left atrium: The atrium was mildly dilated. - Right ventricle: The cavity size was normal. Wall thickness was   normal. Systolic function was normal. - Tricuspid valve: There was trivial regurgitation. - Pulmonary arteries: Systolic pressure was within the normal   range. PA peak pressure: 27 mm Hg (S). - Pericardium, extracardiac: A trivial pericardial effusion was   identified. - Global longitudinal strain -18.3%.  Myoview 02/26/17: Study Highlights    Inferior (basal) and inferolateral (base, mid) thinning with mild improvement in recovery Does not appear to be significant for ischemia probably reflects soft tissue attenuation (attenuation)  Nuclear stress EF: 61%.  Low risk scan   Procedures   CORONARY CTO INTERVENTION  Conclusion     Mid LAD lesion, 45 %stenosed.  Mid Cx lesion, 100 %stenosed.  Post intervention, there is a 100% residual stenosis.  Prox Cx to Mid Cx lesion, 95 %stenosed.  Post intervention, there is a 30% residual stenosis.   1.  Unsuccessful CTO PCI of the LCx. There is some improvement of flow into the small OM with PTCA.    Plan: continue medical management. Will follow and see how he does with medical therapy.    Echo 05/21/17: Study Conclusions  - Left ventricle: The cavity size was normal. Wall thickness was   increased in a pattern of mild LVH. Systolic function was  normal.   The estimated ejection fraction was in the range of 60% to 65%.   Although no diagnostic regional wall motion abnormality was   identified, this possibility cannot be completely excluded on the   basis of this study. Features are consistent with a pseudonormal   left ventricular filling pattern, with concomitant abnormal   relaxation and increased filling pressure (grade 2 diastolic   dysfunction). - Aortic valve: There was no stenosis. - Mitral valve: There was mild regurgitation. - Right ventricle: The cavity size was normal. Systolic function   was normal. - Pulmonary arteries: No complete TR doppler jet so unable to   estimate PA systolic pressure. - Systemic veins: IVC not visualized. - Pericardium, extracardiac: There was no pericardial effusion.  Impressions:  - Normal LV size with mild LV hypertrophy. EF 60-65%. Moderate   diastolic dysfunction. Normal RV size and systolic function. Mild   MR.  ASSESSMENT AND PLAN:  1.  CAD with CTO of the LCx supplying one large OM branch. S/p failed attempt at CTO PCI with complication of perforation with acute pericardial tamponade requiring a pericardial window.  He has stable class 2 angina.  Will continue metoprolol and Imdur. Use Ntg sl prn. Recommend resuming ASA every other day. Continue walking.Encourage weight loss. .  2. HTN -  controlled.   3. HLD On Zetia and pravastatin every other day. Unable to tolerate higher doses.  LDL 89   Current medicines are reviewed at length with the patient today.  The patient does not have concerns regarding medicines.  Follow up  one year   Signed, Ruchama Kubicek [place], MD  05/16/2020 3:49 PM    [facility] [address], [place], [place], [address] Phone [phone], Fax [phone]

## 2020-05-16 ENCOUNTER — Encounter: Payer: Self-pay | Admitting: Cardiology

## 2020-05-16 ENCOUNTER — Ambulatory Visit (INDEPENDENT_AMBULATORY_CARE_PROVIDER_SITE_OTHER): Payer: Medicare Other | Admitting: Cardiology

## 2020-05-16 ENCOUNTER — Other Ambulatory Visit: Payer: Self-pay

## 2020-05-16 VITALS — BP 132/70 | HR 74 | Ht 71.0 in | Wt 240.0 lb

## 2020-05-16 DIAGNOSIS — I25119 Atherosclerotic heart disease of native coronary artery with unspecified angina pectoris: Secondary | ICD-10-CM

## 2020-05-16 DIAGNOSIS — E785 Hyperlipidemia, unspecified: Secondary | ICD-10-CM | POA: Diagnosis not present

## 2020-05-16 DIAGNOSIS — I10 Essential (primary) hypertension: Secondary | ICD-10-CM

## 2020-05-16 DIAGNOSIS — I209 Angina pectoris, unspecified: Secondary | ICD-10-CM

## 2020-05-16 MED ORDER — ASPIRIN 81 MG PO TBEC
81.0000 mg | DELAYED_RELEASE_TABLET | ORAL | 12 refills | Status: DC
Start: 2020-05-16 — End: 2022-05-22

## 2020-05-16 NOTE — Patient Instructions (Signed)
Resume ASA every other day

## 2020-05-22 ENCOUNTER — Encounter: Payer: Self-pay | Admitting: Legal Medicine

## 2020-05-22 ENCOUNTER — Ambulatory Visit (INDEPENDENT_AMBULATORY_CARE_PROVIDER_SITE_OTHER): Payer: Medicare Other

## 2020-05-22 ENCOUNTER — Other Ambulatory Visit: Payer: Self-pay

## 2020-05-22 DIAGNOSIS — Z23 Encounter for immunization: Secondary | ICD-10-CM

## 2020-05-22 NOTE — Progress Notes (Signed)
Patient Name: Joseph Mercado Patient DOB: 07/05/1946  05/22/2020  [name] came in today for his 2021-2022 influenza vaccine.  Patient tolerated it well.   Jacklynn Bue

## 2020-06-12 ENCOUNTER — Other Ambulatory Visit: Payer: Self-pay | Admitting: Cardiology

## 2020-06-13 DIAGNOSIS — Z23 Encounter for immunization: Secondary | ICD-10-CM | POA: Diagnosis not present

## 2020-07-31 ENCOUNTER — Other Ambulatory Visit: Payer: Self-pay | Admitting: Cardiology

## 2020-07-31 ENCOUNTER — Encounter: Payer: Self-pay | Admitting: Legal Medicine

## 2020-08-07 ENCOUNTER — Other Ambulatory Visit: Payer: Self-pay | Admitting: Legal Medicine

## 2020-08-07 DIAGNOSIS — E785 Hyperlipidemia, unspecified: Secondary | ICD-10-CM

## 2020-08-08 ENCOUNTER — Encounter: Payer: Self-pay | Admitting: Nurse Practitioner

## 2020-08-08 ENCOUNTER — Other Ambulatory Visit: Payer: Self-pay

## 2020-08-08 ENCOUNTER — Ambulatory Visit (INDEPENDENT_AMBULATORY_CARE_PROVIDER_SITE_OTHER): Payer: Medicare Other | Admitting: Nurse Practitioner

## 2020-08-08 VITALS — BP 152/82 | HR 66 | Temp 97.2°F | Ht 71.0 in | Wt 245.0 lb

## 2020-08-08 DIAGNOSIS — L2089 Other atopic dermatitis: Secondary | ICD-10-CM | POA: Diagnosis not present

## 2020-08-08 DIAGNOSIS — R21 Rash and other nonspecific skin eruption: Secondary | ICD-10-CM

## 2020-08-08 MED ORDER — TRIAMCINOLONE ACETONIDE 40 MG/ML IJ SUSP
60.0000 mg | Freq: Once | INTRAMUSCULAR | Status: AC
  Administered 2020-08-08: 11:00:00 60 mg via INTRAMUSCULAR

## 2020-08-08 MED ORDER — PREDNISONE 20 MG PO TABS: 20.0000 mg | ORAL_TABLET | Freq: Every day | ORAL | 0 refills | Status: DC

## 2020-08-08 NOTE — Progress Notes (Signed)
Acute Office Visit  Subjective:    Patient ID: Joseph Mercado, male    DOB: [birth year], 74 y.o.   MRN: [id number]  Chief Complaint  Patient presents with  . Rash    HPI Patient is in today for evaluation of rash to anterior neck. Onset of symptoms 3-weeks ago after changing body wash. He describes the rash is pruritic and burning. The rash radiates circumferentially around to posterior neck and inferiorly towards chest. Treatments have included Benadryl cream, A & D ointment, and Baby oil without relief. The rash is irritated by clothing. Denies new laundry detergents, pets in the home, or recent exposure to environmental causes such as poison ivy. Past medical history includes hypertension, CAD, NSTEMI, gout, and hyperlipidemia.   Past Medical History:  Diagnosis Date  . Angina pectoris (HCC)   . CAD (coronary artery disease)    a. 02/2017: NSTEMI with cath showing 100% Prox Cx stenosis with 95% Mid Cx stenosis and 45% mid-LAD stenosis. Unsuccessful attempt at crossing an occluded mid nondominant circumflex --> plan for staged PCI in coming weeks  . Chest pain 02/2017  . Dyspnea   . GERD (gastroesophageal reflux disease)   . High cholesterol   . History of gout   . Hypertension   . Macular degeneration   . NSTEMI (non-ST elevated myocardial infarction) (HCC) 02/2017  . Pericardial effusion 04/2017    Past Surgical History:  Procedure Laterality Date  . CATARACT EXTRACTION W/ INTRAOCULAR LENS  IMPLANT, BILATERAL Bilateral   . CORONARY ANGIOPLASTY    . CORONARY BALLOON ANGIOPLASTY N/A 03/10/2017   Procedure: Coronary Balloon Angioplasty;  Surgeon: Runell Gess, MD;  Location: [facility] INVASIVE CV LAB;  Service: Cardiovascular;  Laterality: N/A;  CFX  . CORONARY CTO INTERVENTION N/A 05/14/2017   Procedure: CORONARY CTO INTERVENTION;  Surgeon: [place], Peter M, MD;  Location: [facility] INVASIVE CV LAB;  Service: Cardiovascular;  Laterality: N/A;  . LEFT HEART CATH AND CORONARY ANGIOGRAPHY N/A  03/10/2017   Procedure: Left Heart Cath and Coronary Angiography;  Surgeon: Runell Gess, MD;  Location: [facility] INVASIVE CV LAB;  Service: Cardiovascular;  Laterality: N/A;  . PERICARDIOCENTESIS N/A 05/14/2017   Procedure: PERICARDIOCENTESIS;  Surgeon: [place], Peter M, MD;  Location: [facility] INVASIVE CV LAB;  Service: Cardiovascular;  Laterality: N/A;  . SUBXYPHOID PERICARDIAL WINDOW N/A 05/15/2017   Procedure: SUBXYPHOID PERICARDIAL WINDOW WITH DRAINAGE OF PERICARDIAL EFFUSION;  Surgeon: Alleen Borne, MD;  Location: [facility] OR;  Service: Thoracic;  Laterality: N/A;  . ULTRASOUND GUIDANCE FOR VASCULAR ACCESS  05/14/2017   Procedure: Ultrasound Guidance For Vascular Access;  Surgeon: [place], Peter M, MD;  Location: [facility] INVASIVE CV LAB;  Service: Cardiovascular;;    Family History  Problem Relation Age of Onset  . CAD Brother        CABG in his 44's    Social History   Socioeconomic History  . Marital status: Divorced    Spouse name: Not on file  . Number of children: Not on file  . Years of education: Not on file  . Highest education level: Not on file  Occupational History  . Not on file  Tobacco Use  . Smoking status: Former Smoker    Packs/day: 3.00    Years: 20.00    Pack years: 60.00    Types: Cigarettes  . Smokeless tobacco: Former Neurosurgeon    Types: Chew  . Tobacco comment: "quit smoking in the 1980s; chewed 4-5 years after I quit smoking"  Vaping Use  .  Vaping Use: Never used  Substance and Sexual Activity  . Alcohol use: Yes    Alcohol/week: 70.0 standard drinks    Types: 70 Cans of beer per week    Comment: 05/14/2017 "6-10 beers/day"  . Drug use: No  . Sexual activity: Not on file  Other Topics Concern  . Not on file  Social History Narrative  . Not on file   Social Determinants of Health   Financial Resource Strain: Not on file  Food Insecurity: Not on file  Transportation Needs: Not on file  Physical Activity: Not on file  Stress: Not on file  Social Connections:  Not on file  Intimate Partner Violence: Not on file    Outpatient Medications Prior to Visit  Medication Sig Dispense Refill  . allopurinol (ZYLOPRIM) 100 MG tablet TAKE 1 TABLET(100 MG) BY MOUTH TWICE DAILY 180 tablet 2  . aspirin 81 MG EC tablet Take 1 tablet (81 mg total) by mouth every other day. Resume on 05/20/17. 30 tablet 12  . ezetimibe (ZETIA) 10 MG tablet TAKE 1 TABLET(10 MG) BY MOUTH DAILY 90 tablet 2  . isosorbide mononitrate (IMDUR) 30 MG 24 hr tablet TAKE 1 TABLET BY MOUTH DAILY 90 tablet 3  . lisinopril (ZESTRIL) 20 MG tablet TAKE 1 TABLET(20 MG) BY MOUTH DAILY 90 tablet 1  . metoprolol succinate (TOPROL-XL) 50 MG 24 hr tablet TAKE 1 TABLET BY MOUTH TWICE DAILY WITH FOOD 180 tablet 3  . Multiple Vitamins-Minerals (PRESERVISION AREDS PO) Take 1 capsule by mouth 2 (two) times daily.    . nitroGLYCERIN (NITROSTAT) 0.4 MG SL tablet Place 1 tablet (0.4 mg total) under the tongue every 5 (five) minutes as needed for chest pain. 25 tablet 6  . pantoprazole (PROTONIX) 40 MG tablet TAKE 1 TABLET BY MOUTH TWICE DAILY 180 tablet 2  . pravastatin (PRAVACHOL) 20 MG tablet TAKE 1 TABLET BY MOUTH DAILY 90 tablet 2  . pravastatin (PRAVACHOL) 20 MG tablet TAKE 1 TABLET BY MOUTH DAILY 90 tablet 0   No facility-administered medications prior to visit.    No Known Allergies  Review of Systems  Constitutional: Negative for fatigue and fever.  HENT: Negative for congestion, ear pain, sinus pressure and sore throat.   Eyes: Negative for pain.  Respiratory: Negative for cough, chest tightness, shortness of breath and wheezing.   Cardiovascular: Negative for chest pain and palpitations.  Gastrointestinal: Negative for abdominal pain, constipation, diarrhea, nausea and vomiting.  Genitourinary: Negative for dysuria and hematuria.  Musculoskeletal: Negative for arthralgias, back pain, joint swelling and myalgias.  Skin: Positive for rash.  Neurological: Negative for dizziness, weakness and  headaches.  Psychiatric/Behavioral: Negative for dysphoric mood. The patient is not nervous/anxious.        Objective:    Physical Exam Constitutional:      Appearance: Normal appearance.  HENT:     Right Ear: Tympanic membrane and external ear normal.     Left Ear: Tympanic membrane and external ear normal.     Nose: Nose normal.     Mouth/Throat:     Mouth: Mucous membranes are moist.  Eyes:     Pupils: Pupils are equal, round, and reactive to light.  Cardiovascular:     Rate and Rhythm: Normal rate and regular rhythm.     Pulses: Normal pulses.     Heart sounds: Normal heart sounds.  Pulmonary:     Breath sounds: Normal breath sounds.  Abdominal:     General: Abdomen is flat. Bowel sounds  are normal.     Palpations: Abdomen is soft.  Musculoskeletal:        General: Normal range of motion.     Cervical back: Normal range of motion.  Skin:    General: Skin is warm and dry.     Findings: Rash present. Rash is urticarial and vesicular.       Neurological:     Mental Status: He is alert and oriented to person, place, and time.  Psychiatric:        Mood and Affect: Mood normal.        Behavior: Behavior normal.     There were no vitals taken for this visit. Wt Readings from Last 3 Encounters:  05/16/20 240 lb (108.9 kg)  03/27/20 238 lb (108 kg)  11/18/19 246 lb (111.6 kg)    Health Maintenance Due  Topic Date Due  . Hepatitis C Screening  Never done  . TETANUS/TDAP  Never done  . PNA vac Low Risk Adult (1 of 2 - PCV13) Never done  . COVID-19 Vaccine (3 - Booster for Moderna series) 05/01/2020    There are no preventive care reminders to display for this patient.   Lab Results  Component Value Date   TSH 3.881 05/20/2017   Lab Results  Component Value Date   WBC 11.5 (H) 03/27/2020   HGB 12.9 (L) 03/27/2020   HCT 42.5 03/27/2020   MCV 84 03/27/2020   PLT 250 03/27/2020   Lab Results  Component Value Date   NA 130 (L) 04/04/2020   K 5.0  04/04/2020   CO2 25 04/04/2020   GLUCOSE 102 (H) 04/04/2020   BUN 9 04/04/2020   CREATININE 1.03 04/04/2020   BILITOT 0.4 04/04/2020   ALKPHOS 101 04/04/2020   AST 20 04/04/2020   ALT 16 04/04/2020   PROT 6.5 04/04/2020   ALBUMIN 4.2 04/04/2020   CALCIUM 8.9 04/04/2020   ANIONGAP 8 05/21/2017   Lab Results  Component Value Date   CHOL 152 03/27/2020   Lab Results  Component Value Date   HDL 39 (L) 03/27/2020   Lab Results  Component Value Date   LDLCALC 89 03/27/2020   Lab Results  Component Value Date   TRIG 135 03/27/2020   Lab Results  Component Value Date   CHOLHDL 3.9 03/27/2020   Lab Results  Component Value Date   HGBA1C 4.7 (L) 05/20/2017         Assessment & Plan:   1. Rash and nonspecific skin eruption - triamcinolone acetonide (KENALOG-40) injection 60 mg - predniSONE (DELTASONE) 20 MG tablet; Take 1 tablet (20 mg total) by mouth daily with breakfast. 1 po tid for 3 days then 1 po bid for 3 days then 1 po qd for 3 days  Dispense: 18 tablet; Refill: 0  2. Other atopic dermatitis -Use Dove soap for bathing -Continue Benadryl cream topically as needed to rash      Steroid injection given in office  Begin Prednisone 20 mg tablets take one tablet by mouth three times daily for 3 days, then take one tablet by mouth twice daily for 3 days, then take one tablet by mouth daily for 3 days  Call office if no improvement in 48 hours  Follow Up: As needed, if symptoms worsen or fail to improve  Flonnie Hailstone, DNP [facility] [place], [place] [phone]

## 2020-08-08 NOTE — Patient Instructions (Addendum)
Steroid injection given in office  Begin Prednisone 20 mg tablets take one tablet by mouth three times daily for 3 days, then take one tablet by mouth twice daily for 3 days, then take one tablet by mouth daily for 3 days  Call office if no improvement in 48 hours    Rash, Adult  A rash is a change in the color of your skin. A rash can also change the way your skin feels. There are many different conditions and factors that can cause a rash. Follow these instructions at home: The goal of treatment is to stop the itching and keep the rash from spreading. Watch for any changes in your symptoms. Let your doctor know about them. Follow these instructions to help with your condition: Medicine Take or apply over-the-counter and prescription medicines only as told by your doctor. These may include medicines:  To treat red or swollen skin (corticosteroid creams).  To treat itching.  To treat an allergy (oral antihistamines).  To treat very bad symptoms (oral corticosteroids).  Skin care  Put cool cloths (compresses) on the affected areas.  Do not scratch or rub your skin.  Avoid covering the rash. Make sure that the rash is exposed to air as much as possible. Managing itching and discomfort  Avoid hot showers or baths. These can make itching worse. A cold shower may help.  Try taking a bath with: ? Epsom salts. You can get these at your local pharmacy or grocery store. Follow the instructions on the package. ? Baking soda. Pour a small amount into the bath as told by your doctor. ? Colloidal oatmeal. You can get this at your local pharmacy or grocery store. Follow the instructions on the package.  Try putting baking soda paste onto your skin. Stir water into baking soda until it gets like a paste.  Try putting on a lotion that relieves itchiness (calamine lotion).  Keep cool and out of the sun. Sweating and being hot can make itching worse. General instructions   Rest as  needed.  Drink enough fluid to keep your pee (urine) pale yellow.  Wear loose-fitting clothing.  Avoid scented soaps, detergents, and perfumes. Use gentle soaps, detergents, perfumes, and other cosmetic products.  Avoid anything that causes your rash. Keep a journal to help track what causes your rash. Write down: ? What you eat. ? What cosmetic products you use. ? What you drink. ? What you wear. This includes jewelry.  Keep all follow-up visits as told by your doctor. This is important. Contact a doctor if:  You sweat at night.  You lose weight.  You pee (urinate) more than normal.  You pee less than normal, or you notice that your pee is a darker color than normal.  You feel weak.  You throw up (vomit).  Your skin or the whites of your eyes look yellow (jaundice).  Your skin: ? Tingles. ? Is numb.  Your rash: ? Does not go away after a few days. ? Gets worse.  You are: ? More thirsty than normal. ? More tired than normal.  You have: ? New symptoms. ? Pain in your belly (abdomen). ? A fever. ? Watery poop (diarrhea). Get help right away if:  You have a fever and your symptoms suddenly get worse.  You start to feel mixed up (confused).  You have a very bad headache or a stiff neck.  You have very bad joint pains or stiffness.  You have jerky movements that  you cannot control (seizure).  Your rash covers all or most of your body. The rash may or may not be painful.  You have blisters that: ? Are on top of the rash. ? Grow larger. ? Grow together. ? Are painful. ? Are inside your nose or mouth.  You have a rash that: ? Looks like purple pinprick-sized spots all over your body. ? Has a "bull's eye" or looks like a target. ? Is red and painful, causes your skin to peel, and is not from being in the sun too long. Summary  A rash is a change in the color of your skin. A rash can also change the way your skin feels.  The goal of treatment is to  stop the itching and keep the rash from spreading.  Take or apply over-the-counter and prescription medicines only as told by your doctor.  Contact a doctor if you have new symptoms or symptoms that get worse.  Keep all follow-up visits as told by your doctor. This is important. This information is not intended to replace advice given to you by your health care provider. Make sure you discuss any questions you have with your health care provider. Document Revised: 11/27/2018 Document Reviewed: 03/09/2018 Elsevier Patient Education  2020 Elsevier Inc. Prednisone tablets What is this medicine? PREDNISONE (PRED ni sone) is a corticosteroid. It is commonly used to treat inflammation of the skin, joints, lungs, and other organs. Common conditions treated include asthma, allergies, and arthritis. It is also used for other conditions, such as blood disorders and diseases of the adrenal glands. This medicine may be used for other purposes; ask your health care provider or pharmacist if you have questions. COMMON BRAND NAME(S): Deltasone, Predone, Sterapred, Sterapred DS What should I tell my health care provider before I take this medicine? They need to know if you have any of these conditions:  Cushing's syndrome  diabetes  glaucoma  heart disease  high blood pressure  infection (especially a virus infection such as chickenpox, cold sores, or herpes)  kidney disease  liver disease  mental illness  myasthenia gravis  osteoporosis  seizures  stomach or intestine problems  thyroid disease  an unusual or allergic reaction to lactose, prednisone, other medicines, foods, dyes, or preservatives  pregnant or trying to get pregnant  breast-feeding How should I use this medicine? Take this medicine by mouth with a glass of water. Follow the directions on the prescription label. Take this medicine with food. If you are taking this medicine once a day, take it in the morning. Do not  take more medicine than you are told to take. Do not suddenly stop taking your medicine because you may develop a severe reaction. Your doctor will tell you how much medicine to take. If your doctor wants you to stop the medicine, the dose may be slowly lowered over time to avoid any side effects. Talk to your pediatrician regarding the use of this medicine in children. Special care may be needed. Overdosage: If you think you have taken too much of this medicine contact a poison control center or emergency room at once. NOTE: This medicine is only for you. Do not share this medicine with others. What if I miss a dose? If you miss a dose, take it as soon as you can. If it is almost time for your next dose, talk to your doctor or health care professional. You may need to miss a dose or take an extra dose. Do not  take double or extra doses without advice. What may interact with this medicine? Do not take this medicine with any of the following medications:  metyrapone  mifepristone This medicine may also interact with the following medications:  aminoglutethimide  amphotericin B  aspirin and aspirin-like medicines  barbiturates  certain medicines for diabetes, like glipizide or glyburide  cholestyramine  cholinesterase inhibitors  cyclosporine  digoxin  diuretics  ephedrine  male hormones, like estrogens and birth control pills  isoniazid  ketoconazole  NSAIDS, medicines for pain and inflammation, like ibuprofen or naproxen  phenytoin  rifampin  toxoids  vaccines  warfarin This list may not describe all possible interactions. Give your health care provider a list of all the medicines, herbs, non-prescription drugs, or dietary supplements you use. Also tell them if you smoke, drink alcohol, or use illegal drugs. Some items may interact with your medicine. What should I watch for while using this medicine? Visit your doctor or health care professional for regular  checks on your progress. If you are taking this medicine over a prolonged period, carry an identification card with your name and address, the type and dose of your medicine, and your doctor's name and address. This medicine may increase your risk of getting an infection. Tell your doctor or health care professional if you are around anyone with measles or chickenpox, or if you develop sores or blisters that do not heal properly. If you are going to have surgery, tell your doctor or health care professional that you have taken this medicine within the last twelve months. Ask your doctor or health care professional about your diet. You may need to lower the amount of salt you eat. This medicine may increase blood sugar. Ask your healthcare provider if changes in diet or medicines are needed if you have diabetes. What side effects may I notice from receiving this medicine? Side effects that you should report to your doctor or health care professional as soon as possible:  allergic reactions like skin rash, itching or hives, swelling of the face, lips, or tongue  changes in emotions or moods  changes in vision  depressed mood  eye pain  fever or chills, cough, sore throat, pain or difficulty passing urine  signs and symptoms of high blood sugar such as being more thirsty or hungry or having to urinate more than normal. You may also feel very tired or have blurry vision.  swelling of ankles, feet Side effects that usually do not require medical attention (report to your doctor or health care professional if they continue or are bothersome):  confusion, excitement, restlessness  headache  nausea, vomiting  skin problems, acne, thin and shiny skin  trouble sleeping  weight gain This list may not describe all possible side effects. Call your doctor for medical advice about side effects. You may report side effects to FDA at 1-800-FDA-1088. Where should I keep my medicine? Keep out of  the reach of children. Store at room temperature between 15 and 30 degrees C (59 and 86 degrees F). Protect from light. Keep container tightly closed. Throw away any unused medicine after the expiration date. NOTE: This sheet is a summary. It may not cover all possible information. If you have questions about this medicine, talk to your doctor, pharmacist, or health care provider.  2020 Elsevier/Gold Standard (2018-05-05 10:54:22)

## 2020-08-18 ENCOUNTER — Telehealth: Payer: Self-pay | Admitting: Nurse Practitioner

## 2020-08-18 NOTE — Telephone Encounter (Signed)
Pt called after-hours call phone, stated he has significant decreased hearing in left ear for 3-days. He is completing a course of prednisone that he was prescribed on 12/21/ 2021 for rash. Treatments for hearing loss have included Debrox cerumen drops and hydrogen peroxide irrigation without relief. He denies pain to left ear, recent air travel or URI, previous trauma or surgery. He states he will call on Monday, 08/21/2020 to follow-up with Dr Marina Goodell.

## 2020-08-22 ENCOUNTER — Encounter: Payer: Self-pay | Admitting: Legal Medicine

## 2020-08-22 ENCOUNTER — Other Ambulatory Visit: Payer: Self-pay

## 2020-08-22 ENCOUNTER — Ambulatory Visit (INDEPENDENT_AMBULATORY_CARE_PROVIDER_SITE_OTHER): Payer: Medicare Other | Admitting: Legal Medicine

## 2020-08-22 VITALS — BP 120/64 | HR 69 | Temp 97.6°F | Resp 16 | Ht 71.0 in | Wt 246.6 lb

## 2020-08-22 DIAGNOSIS — J4489 Other specified chronic obstructive pulmonary disease: Secondary | ICD-10-CM

## 2020-08-22 DIAGNOSIS — I209 Angina pectoris, unspecified: Secondary | ICD-10-CM | POA: Diagnosis not present

## 2020-08-22 DIAGNOSIS — J449 Chronic obstructive pulmonary disease, unspecified: Secondary | ICD-10-CM | POA: Diagnosis not present

## 2020-08-22 DIAGNOSIS — H6502 Acute serous otitis media, left ear: Secondary | ICD-10-CM | POA: Diagnosis not present

## 2020-08-22 DIAGNOSIS — H6522 Chronic serous otitis media, left ear: Secondary | ICD-10-CM | POA: Diagnosis not present

## 2020-08-22 MED ORDER — CHLORPHENIRAMINE-PHENYLEPHRINE 4-10 MG PO TABS: 1.0000 | ORAL_TABLET | ORAL | 2 refills | Status: DC | PRN

## 2020-08-22 NOTE — Progress Notes (Signed)
Subjective:  Patient ID: Joseph Mercado, male    DOB: Sep 29, 1945  Age: 75 y.o. MRN: [id number]  Chief Complaint  Patient presents with  . Hearing Problem    L ear; started about a week ago. States he has used ear wax removal and it has improved.    HPIL hearing loss.  Started  One week ago, he was on prednisone and had buzzing in ears, he stopped his prednisone and is better but still not healing well Current Outpatient Medications on File Prior to Visit  Medication Sig Dispense Refill  . allopurinol (ZYLOPRIM) 100 MG tablet TAKE 1 TABLET(100 MG) BY MOUTH TWICE DAILY 180 tablet 2  . aspirin 81 MG EC tablet Take 1 tablet (81 mg total) by mouth every other day. Resume on 05/20/17. 30 tablet 12  . ezetimibe (ZETIA) 10 MG tablet TAKE 1 TABLET(10 MG) BY MOUTH DAILY 90 tablet 2  . isosorbide mononitrate (IMDUR) 30 MG 24 hr tablet TAKE 1 TABLET BY MOUTH DAILY 90 tablet 3  . lisinopril (ZESTRIL) 20 MG tablet TAKE 1 TABLET(20 MG) BY MOUTH DAILY 90 tablet 1  . metoprolol succinate (TOPROL-XL) 50 MG 24 hr tablet TAKE 1 TABLET BY MOUTH TWICE DAILY WITH FOOD 180 tablet 3  . Multiple Vitamins-Minerals (PRESERVISION AREDS PO) Take 1 capsule by mouth 2 (two) times daily.    . nitroGLYCERIN (NITROSTAT) 0.4 MG SL tablet Place 1 tablet (0.4 mg total) under the tongue every 5 (five) minutes as needed for chest pain. 25 tablet 6  . pantoprazole (PROTONIX) 40 MG tablet TAKE 1 TABLET BY MOUTH TWICE DAILY 180 tablet 2  . pravastatin (PRAVACHOL) 20 MG tablet TAKE 1 TABLET BY MOUTH DAILY 90 tablet 2  . pravastatin (PRAVACHOL) 20 MG tablet TAKE 1 TABLET BY MOUTH DAILY 90 tablet 0  . predniSONE (DELTASONE) 20 MG tablet Take 1 tablet (20 mg total) by mouth daily with breakfast. 1 po tid for 3 days then 1 po bid for 3 days then 1 po qd for 3 days 18 tablet 0   No current facility-administered medications on file prior to visit.   Past Medical History:  Diagnosis Date  . Angina pectoris (HCC)   . CAD (coronary artery  disease)    a. 02/2017: NSTEMI with cath showing 100% Prox Cx stenosis with 95% Mid Cx stenosis and 45% mid-LAD stenosis. Unsuccessful attempt at crossing an occluded mid nondominant circumflex --> plan for staged PCI in coming weeks  . Chest pain 02/2017  . Dyspnea   . GERD (gastroesophageal reflux disease)   . High cholesterol   . History of gout   . Hypertension   . Macular degeneration   . NSTEMI (non-ST elevated myocardial infarction) (HCC) 02/2017  . Pericardial effusion 04/2017   Past Surgical History:  Procedure Laterality Date  . CATARACT EXTRACTION W/ INTRAOCULAR LENS  IMPLANT, BILATERAL Bilateral   . CORONARY ANGIOPLASTY    . CORONARY BALLOON ANGIOPLASTY N/A 03/10/2017   Procedure: Coronary Balloon Angioplasty;  Surgeon: Runell Gess, MD;  Location: [facility] INVASIVE CV LAB;  Service: Cardiovascular;  Laterality: N/A;  CFX  . CORONARY CTO INTERVENTION N/A 05/14/2017   Procedure: CORONARY CTO INTERVENTION;  Surgeon: Swaziland, Peter M, MD;  Location: [facility] INVASIVE CV LAB;  Service: Cardiovascular;  Laterality: N/A;  . LEFT HEART CATH AND CORONARY ANGIOGRAPHY N/A 03/10/2017   Procedure: Left Heart Cath and Coronary Angiography;  Surgeon: Runell Gess, MD;  Location: [facility] INVASIVE CV LAB;  Service: Cardiovascular;  Laterality: N/A;  .  PERICARDIOCENTESIS N/A 05/14/2017   Procedure: PERICARDIOCENTESIS;  Surgeon: Swaziland, Peter M, MD;  Location: [facility] INVAS[facility] CV LAB;  Service: Cardiovascular;  Laterality: N/A;  . SUBXYPHOID PERICARDIAL WINDOW N/A 05/15/2017   Procedure: SUBXYPHOID PERICARDIAL WINDOW WITH DRAINAGE OF PERICARDIAL EFFUSION;  Surgeon: Alleen Borne, MD;  Location: [facility] OR;  Service: Thoracic;  Laterality: N/A;  . ULTRASOUND GUIDANCE FOR VASCULAR ACCESS  05/14/2017   Procedure: Ultrasound Guidance For Vascular Access;  Surgeon: Swaziland, Peter M, MD;  Location: [facility] INVASIVE CV LAB;  Service: Cardiovascular;;    Family History  Problem Relation Age of Onset  . CAD Brother        CABG  in his 30's   Social History   Socioeconomic History  . Marital status: Divorced    Spouse name: Not on file  . Number of children: Not on file  . Years of education: Not on file  . Highest education level: Not on file  Occupational History  . Not on file  Tobacco Use  . Smoking status: Former Smoker    Packs/day: 3.00    Years: 20.00    Pack years: 60.00    Types: Cigarettes  . Smokeless tobacco: Former Neurosurgeon    Types: Chew  . Tobacco comment: "quit smoking in the 1980s; chewed 4-5 years after I quit smoking"  Vaping Use  . Vaping Use: Never used  Substance and Sexual Activity  . Alcohol use: Yes    Alcohol/week: 70.0 standard drinks    Types: 70 Cans of beer per week    Comment: 05/14/2017 "6-10 beers/day"  . Drug use: No  . Sexual activity: Not on file  Other Topics Concern  . Not on file  Social History Narrative  . Not on file   Social Determinants of Health   Financial Resource Strain: Not on file  Food Insecurity: Not on file  Transportation Needs: Not on file  Physical Activity: Not on file  Stress: Not on file  Social Connections: Not on file    Review of Systems  Constitutional: Negative for activity change, appetite change and fatigue.  HENT: Positive for congestion and hearing loss.   Eyes: Negative for visual disturbance.  Respiratory: Negative for apnea and cough.   Cardiovascular: Negative for chest pain and leg swelling.     Objective:  BP 120/64 (BP Location: Left Arm, Patient Position: Sitting, Cuff Size: Normal)   Pulse 69   Temp 97.6 F (36.4 C) (Temporal)   Resp 16   Ht 5\' 11"  (1.803 m)   Wt 246 lb 9.6 oz (111.9 kg)   SpO2 97%   BMI 34.39 kg/m   BP/Weight 08/22/2020 08/08/2020 05/16/2020  Systolic BP 120 152 132  Diastolic BP 64 82 70  Wt. (Lbs) 246.6 245 240  BMI 34.39 34.17 33.47    Physical Exam Vitals reviewed.  Constitutional:      Appearance: Normal appearance.  HENT:     Right Ear: Tympanic membrane, ear canal and  external ear normal.     Left Ear: Tympanic membrane, ear canal and external ear normal.     Ears:     Comments: Left TM immobile on insufflation Cardiovascular:     Rate and Rhythm: Normal rate and regular rhythm.     Pulses: Normal pulses.     Heart sounds: Normal heart sounds.  Pulmonary:     Effort: Pulmonary effort is normal.     Breath sounds: Normal breath sounds.  Neurological:     Mental Status:  He is alert.       Lab Results  Component Value Date   WBC 11.5 (H) 03/27/2020   HGB 12.9 (L) 03/27/2020   HCT 42.5 03/27/2020   PLT 250 03/27/2020   GLUCOSE 102 (H) 04/04/2020   CHOL 152 03/27/2020   TRIG 135 03/27/2020   HDL 39 (L) 03/27/2020   LDLCALC 89 03/27/2020   ALT 16 04/04/2020   AST 20 04/04/2020   NA 130 (L) 04/04/2020   K 5.0 04/04/2020   CL 96 04/04/2020   CREATININE 1.03 04/04/2020   BUN 9 04/04/2020   CO2 25 04/04/2020   TSH 3.881 05/20/2017   INR 1.11 05/20/2017   HGBA1C 4.7 (L) 05/20/2017    Hearing Screening   125Hz  250Hz  500Hz  1000Hz  2000Hz  3000Hz  4000Hz  6000Hz  8000Hz   Right ear:   40 40 25  0    Left ear:   20 40 20  0        Assessment & Plan:   Diagnoses and all orders for this visit: Non-recurrent acute serous otitis media of left ear -     Chlorpheniramine-Phenylephrine 4-10 MG tablet; Take 1 tablet by mouth every 4 (four) hours as needed for congestion. Patient has cleaned all cerumen from ears  But the TM remains immobile.  We discussed this.  Obstructive chronic bronchitis without exacerbation (HCC) An individualize plan was formulated for care of COPD.  Treatment is evidence based.  She will continue on inhalers, avoid smoking and smoke.  Regular exercise with help with dyspnea. Routine follow ups and medication compliance is needed.  Angina pectoris [facility]) An individual plan was formulated based on patient history and exam, labs and evidence based data. Patient has not had recent angina or nitroglycerin use. continue present  treatment.         I spent 15 minutes dedicated to the care of this patient on the date of this encounter to include face-to-face time with the patient, as well as:   Follow-up: Return if symptoms worsen or fail to improve.  An After Visit Summary was printed and given to the patient.  Brent Bulla, MD [facility] [phone]

## 2020-09-10 ENCOUNTER — Other Ambulatory Visit: Payer: Self-pay | Admitting: Legal Medicine

## 2020-09-18 ENCOUNTER — Telehealth: Payer: Self-pay | Admitting: Cardiology

## 2020-09-18 NOTE — Telephone Encounter (Signed)
*  STAT* If patient is at the pharmacy, call can be transferred to refill team.   1. Which medications need to be refilled? (please list name of each medication and dose if known)  lisinopril (ZESTRIL) 20 MG tablet   2. Which pharmacy/location (including street and city if local pharmacy) is medication to be sent to? [id number] - [place], [place] - [address] AT [address] 3. Do they need a 30 day or 90 day supply? [place]

## 2020-09-19 MED ORDER — LISINOPRIL 20 MG PO TABS: ORAL_TABLET | ORAL | 1 refills | Status: DC

## 2020-09-19 NOTE — Telephone Encounter (Signed)
Refills sent to pharmacy. 

## 2020-09-27 ENCOUNTER — Other Ambulatory Visit: Payer: Self-pay

## 2020-09-27 ENCOUNTER — Ambulatory Visit (INDEPENDENT_AMBULATORY_CARE_PROVIDER_SITE_OTHER): Payer: Medicare Other | Admitting: Legal Medicine

## 2020-09-27 ENCOUNTER — Encounter: Payer: Self-pay | Admitting: Legal Medicine

## 2020-09-27 VITALS — BP 122/80 | HR 66 | Temp 97.1°F | Ht 61.0 in | Wt 240.0 lb

## 2020-09-27 DIAGNOSIS — I10 Essential (primary) hypertension: Secondary | ICD-10-CM | POA: Diagnosis not present

## 2020-09-27 DIAGNOSIS — M1A00X0 Idiopathic chronic gout, unspecified site, without tophus (tophi): Secondary | ICD-10-CM | POA: Diagnosis not present

## 2020-09-27 DIAGNOSIS — J449 Chronic obstructive pulmonary disease, unspecified: Secondary | ICD-10-CM

## 2020-09-27 DIAGNOSIS — I25119 Atherosclerotic heart disease of native coronary artery with unspecified angina pectoris: Secondary | ICD-10-CM

## 2020-09-27 DIAGNOSIS — E785 Hyperlipidemia, unspecified: Secondary | ICD-10-CM | POA: Diagnosis not present

## 2020-09-27 NOTE — Progress Notes (Signed)
Subjective:  Patient ID: Joseph Mercado, male    DOB: 01-Apr-1946  Age: 75 y.o. MRN: [id number]  Chief Complaint  Patient presents with  . Hypertension    32M F/U    HPI: chronic visit  Patient presents for follow up of hypertension.  Patient tolerating lisinopil, metformin well with side effects.  Patient was diagnosed with hypertension 2010 so has been treated for hypertension for 10 years.Patient is working on maintaining diet and exercise regimen and follows up as directed. Complication include cad  Patient presents with hyperlipidemia.  Compliance with treatment has been good; patient takes medicines as directed, maintains low cholesterol diet, follows up as directed, and maintains exercise regimen.  Patient is using pravastin without problems..  CORONARY ARTERY DISEASE  Patient presents in follow up of CAD. Patient was diagnosed in 2018. The patient has no associated CHF. The patient is currently taking a beta blocker, statin, and aspirin. CAD was diagnosed 3 years ago.  Patient is having some angina. Patient has used no  NTG.  Patient is followed by cardiology.  Patient had stent . Last angiography was 2018, last echocardiogram none.   Current Outpatient Medications on File Prior to Visit  Medication Sig Dispense Refill  . allopurinol (ZYLOPRIM) 100 MG tablet TAKE 1 TABLET(100 MG) BY MOUTH TWICE DAILY 180 tablet 2  . aspirin 81 MG EC tablet Take 1 tablet (81 mg total) by mouth every other day. Resume on 05/20/17. 30 tablet 12  . ezetimibe (ZETIA) 10 MG tablet TAKE 1 TABLET(10 MG) BY MOUTH DAILY 90 tablet 2  . isosorbide mononitrate (IMDUR) 30 MG 24 hr tablet TAKE 1 TABLET BY MOUTH DAILY 90 tablet 3  . lisinopril (ZESTRIL) 20 MG tablet TAKE 1 TABLET(20 MG) BY MOUTH DAILY 90 tablet 1  . metoprolol succinate (TOPROL-XL) 50 MG 24 hr tablet TAKE 1 TABLET BY MOUTH TWICE DAILY WITH FOOD 180 tablet 3  . Multiple Vitamins-Minerals (PRESERVISION AREDS PO) Take 1 capsule by mouth 2 (two) times  daily.    . nitroGLYCERIN (NITROSTAT) 0.4 MG SL tablet Place 1 tablet (0.4 mg total) under the tongue every 5 (five) minutes as needed for chest pain. 25 tablet 6  . pantoprazole (PROTONIX) 40 MG tablet TAKE 1 TABLET BY MOUTH TWICE DAILY 180 tablet 2  . pravastatin (PRAVACHOL) 20 MG tablet TAKE 1 TABLET BY MOUTH DAILY 90 tablet 2  . pravastatin (PRAVACHOL) 20 MG tablet TAKE 1 TABLET BY MOUTH DAILY 90 tablet 0   No current facility-administered medications on file prior to visit.   Past Medical History:  Diagnosis Date  . Angina pectoris (HCC)   . CAD (coronary artery disease)    a. 02/2017: NSTEMI with cath showing 100% Prox Cx stenosis with 95% Mid Cx stenosis and 45% mid-LAD stenosis. Unsuccessful attempt at crossing an occluded mid nondominant circumflex --> plan for staged PCI in coming weeks  . Chest pain 02/2017  . Dyspnea   . GERD (gastroesophageal reflux disease)   . High cholesterol   . History of gout   . Hypertension   . Macular degeneration   . NSTEMI (non-ST elevated myocardial infarction) (HCC) 02/2017  . Pericardial effusion 04/2017   Past Surgical History:  Procedure Laterality Date  . CATARACT EXTRACTION W/ INTRAOCULAR LENS  IMPLANT, BILATERAL Bilateral   . CORONARY ANGIOPLASTY    . CORONARY BALLOON ANGIOPLASTY N/A 03/10/2017   Procedure: Coronary Balloon Angioplasty;  Surgeon: Runell Gess, MD;  Location: [facility] INVASIVE CV LAB;  Service: Cardiovascular;  Laterality: N/A;  CFX  . CORONARY CTO INTERVENTION N/A 05/14/2017   Procedure: CORONARY CTO INTERVENTION;  Surgeon: Swaziland, Peter M, MD;  Location: [facility] INVASIVE CV LAB;  Service: Cardiovascular;  Laterality: N/A;  . LEFT HEART CATH AND CORONARY ANGIOGRAPHY N/A 03/10/2017   Procedure: Left Heart Cath and Coronary Angiography;  Surgeon: Runell Gess, MD;  Location: [facility] INVASIVE CV LAB;  Service: Cardiovascular;  Laterality: N/A;  . PERICARDIOCENTESIS N/A 05/14/2017   Procedure: PERICARDIOCENTESIS;  Surgeon: Swaziland,  Peter M, MD;  Location: [facility] INVASIVE CV LAB;  Service: Cardiovascular;  Laterality: N/A;  . SUBXYPHOID PERICARDIAL WINDOW N/A 05/15/2017   Procedure: SUBXYPHOID PERICARDIAL WINDOW WITH DRAINAGE OF PERICARDIAL EFFUSION;  Surgeon: Alleen Borne, MD;  Location: [facility] OR;  Service: Thoracic;  Laterality: N/A;  . ULTRASOUND GUIDANCE FOR VASCULAR ACCESS  05/14/2017   Procedure: Ultrasound Guidance For Vascular Access;  Surgeon: Swaziland, Peter M, MD;  Location: [facility] INVASIVE CV LAB;  Service: Cardiovascular;;    Family History  Problem Relation Age of Onset  . CAD Brother        CABG in his 41's   Social History   Socioeconomic History  . Marital status: Divorced    Spouse name: Not on file  . Number of children: Not on file  . Years of education: Not on file  . Highest education level: Not on file  Occupational History  . Not on file  Tobacco Use  . Smoking status: Former Smoker    Packs/day: 3.00    Years: 20.00    Pack years: 60.00    Types: Cigarettes  . Smokeless tobacco: Former Neurosurgeon    Types: Chew  . Tobacco comment: "quit smoking in the 1980s; chewed 4-5 years after I quit smoking"  Vaping Use  . Vaping Use: Never used  Substance and Sexual Activity  . Alcohol use: Yes    Alcohol/week: 70.0 standard drinks    Types: 70 Cans of beer per week    Comment: 05/14/2017 "6-10 beers/day"  . Drug use: No  . Sexual activity: Not on file  Other Topics Concern  . Not on file  Social History Narrative  . Not on file   Social Determinants of Health   Financial Resource Strain: Not on file  Food Insecurity: Not on file  Transportation Needs: Not on file  Physical Activity: Not on file  Stress: Not on file  Social Connections: Not on file    Review of Systems  Constitutional: Negative for fatigue.  HENT: Negative for congestion, ear pain and sore throat.   Eyes: Negative.   Respiratory: Negative for cough and shortness of breath.   Cardiovascular: Negative for chest pain.   Gastrointestinal: Negative for abdominal pain, constipation, diarrhea, nausea and vomiting.  Endocrine: Negative.   Genitourinary: Negative for dysuria, frequency and urgency.  Musculoskeletal: Negative for arthralgias, back pain and myalgias.  Skin: Negative.   Allergic/Immunologic: Negative.   Neurological: Negative for dizziness and headaches.  Hematological: Negative.   Psychiatric/Behavioral: Negative for agitation and sleep disturbance. The patient is not nervous/anxious.      Objective:  BP 122/80 (BP Location: Left Arm, Patient Position: Sitting, Cuff Size: Normal)   Pulse 66   Temp (!) 97.1 F (36.2 C) (Temporal)   Ht 5\' 1"  (1.549 m)   Wt 240 lb (108.9 kg)   SpO2 98%   BMI 45.35 kg/m   BP/Weight 09/27/2020 08/22/2020 08/08/2020  Systolic BP 122 120 152  Diastolic BP 80 64 82  Wt. (  Lbs) 240 246.6 245  BMI 45.35 34.39 34.17    Physical Exam Vitals reviewed.  Constitutional:      General: He is not in acute distress.    Appearance: Normal appearance.  HENT:     Head: Normocephalic.     Right Ear: Tympanic membrane normal.     Left Ear: Tympanic membrane normal.     Nose: Nose normal.     Mouth/Throat:     Mouth: Mucous membranes are dry.  Eyes:     Extraocular Movements: Extraocular movements intact.     Conjunctiva/sclera: Conjunctivae normal.     Pupils: Pupils are equal, round, and reactive to light.  Cardiovascular:     Rate and Rhythm: Normal rate and regular rhythm.     Pulses: Normal pulses.     Heart sounds: Normal heart sounds. No murmur heard. No gallop.   Pulmonary:     Effort: Pulmonary effort is normal. No respiratory distress.     Breath sounds: Normal breath sounds. No rales.  Abdominal:     General: Bowel sounds are normal. There is no distension.     Tenderness: There is no abdominal tenderness.  Musculoskeletal:        General: Normal range of motion.     Cervical back: Normal range of motion.     Right lower leg: No edema.     Left  lower leg: No edema.  Skin:    General: Skin is warm.     Capillary Refill: Capillary refill takes less than 2 seconds.  Neurological:     General: No focal deficit present.     Mental Status: He is alert and oriented to person, place, and time. Mental status is at baseline.  Psychiatric:        Mood and Affect: Mood normal.        Behavior: Behavior normal.        Thought Content: Thought content normal.       Lab Results  Component Value Date   WBC 11.5 (H) 03/27/2020   HGB 12.9 (L) 03/27/2020   HCT 42.5 03/27/2020   PLT 250 03/27/2020   GLUCOSE 102 (H) 04/04/2020   CHOL 152 03/27/2020   TRIG 135 03/27/2020   HDL 39 (L) 03/27/2020   LDLCALC 89 03/27/2020   ALT 16 04/04/2020   AST 20 04/04/2020   NA 130 (L) 04/04/2020   K 5.0 04/04/2020   CL 96 04/04/2020   CREATININE 1.03 04/04/2020   BUN 9 04/04/2020   CO2 25 04/04/2020   TSH 3.881 05/20/2017   INR 1.11 05/20/2017   HGBA1C 4.7 (L) 05/20/2017      Assessment & Plan:  Diagnoses and all orders for this visit: Essential hypertension -     CBC with Differential/Platelet -     Comprehensive metabolic panel An individual hypertension care plan was established and reinforced today.  The patient's status was assessed using clinical findings on exam and labs or diagnostic tests. The patient's success at meeting treatment goals on disease specific evidence-based guidelines and found to be fair controlled. SELF MANAGEMENT: The patient and I together assessed ways to personally work towards obtaining the recommended goals. RECOMMENDATIONS: avoid decongestants found in common cold remedies, decrease consumption of alcohol, perform routine monitoring of BP with home BP cuff, exercise, reduction of dietary salt, take medicines as prescribed, try not to miss doses and quit smoking.  Regular exercise and maintaining a healthy weight is needed.  Stress reduction may help. A  CLINICAL SUMMARY including written plan identify barriers  to care unique to individual due to social or financial issues.  We attempt to mutually creat solutions for individual and family understanding.  Coronary artery disease involving native coronary artery of native heart with angina pectoris [facility]) Patient's CAD was assessed using history and physical along with other information to maximize treatment.  Evidence based criteria was use in deciding proper management for this disease process.  Patient's CAD is under good control.therapy continue present treatment . Obstructive chronic bronchitis without exacerbation (HCC) An individualize plan was formulated for care of COPD.  Treatment is evidence based.  She will continue on inhalers, avoid smoking and smoke.  Regular exercise with help with dyspnea. Routine follow ups and medication compliance is needed.  Chronic gouty arthritis AN INDIVIDUAL CARE PLAN for gout was established and reinforced today.  The patient's status was assessed using clinical findings on exam, labs, and other diagnostic testing. Patient's success at meeting treatment goals based on disease specific evidence-bassed guidelines and found to be in good control. RECOMMENDATIONS include maitaining present medicines and treatment.  Hyperlipidemia LDL goal <70 -     Lipid panel AN INDIVIDUAL CARE PLAN for hyperlipidemia/ cholesterol was established and reinforced today.  The patient's status was assessed using clinical findings on exam, lab and other diagnostic tests. The patient's disease status was assessed based on evidence-based guidelines and found to be well controlled. MEDICATIONS were reviewed. SELF MANAGEMENT GOALS have been discussed and patient's success at attaining the goal of low cholesterol was assessed. RECOMMENDATION given include regular exercise 3 days a week and low cholesterol/low fat diet. CLINICAL SUMMARY including written plan to identify barriers unique to the patient due to social or economic  reasons was  discussed.         I spent 30 minutes dedicated to the care of this patient on the date of this encounter to include face-to-face time with the patient, as well as: records review  Follow-up: Return in about 6 months (around 03/27/2021) for fasting.  An After Visit Summary was printed and given to the patient.  Brent Bulla, MD [facility] [phone]

## 2020-09-28 LAB — CBC WITH DIFFERENTIAL/PLATELET
Basophils Absolute: 0.1 10*3/uL (ref 0.0–0.2)
Basos: 1 %
EOS (ABSOLUTE): 0.2 10*3/uL (ref 0.0–0.4)
Eos: 1 %
Hematocrit: 42 % (ref 37.5–51.0)
Hemoglobin: 13.4 g/dL (ref 13.0–17.7)
Immature Grans (Abs): 0 10*3/uL (ref 0.0–0.1)
Immature Granulocytes: 0 %
Lymphocytes Absolute: 2.2 10*3/uL (ref 0.7–3.1)
Lymphs: 20 %
MCH: 26.3 pg — ABNORMAL LOW (ref 26.6–33.0)
MCHC: 31.9 g/dL (ref 31.5–35.7)
MCV: 82 fL (ref 79–97)
Monocytes Absolute: 0.9 10*3/uL (ref 0.1–0.9)
Monocytes: 8 %
Neutrophils Absolute: 7.8 10*3/uL — ABNORMAL HIGH (ref 1.4–7.0)
Neutrophils: 70 %
Platelets: 207 10*3/uL (ref 150–450)
RBC: 5.1 x10E6/uL (ref 4.14–5.80)
RDW: 15.8 % — ABNORMAL HIGH (ref 11.6–15.4)
WBC: 11.2 10*3/uL — ABNORMAL HIGH (ref 3.4–10.8)

## 2020-09-28 LAB — COMPREHENSIVE METABOLIC PANEL
ALT: 16 IU/L (ref 0–44)
AST: 23 IU/L (ref 0–40)
Albumin/Globulin Ratio: 1.5 (ref 1.2–2.2)
Albumin: 4 g/dL (ref 3.7–4.7)
Alkaline Phosphatase: 103 IU/L (ref 44–121)
BUN/Creatinine Ratio: 9 — ABNORMAL LOW (ref 10–24)
BUN: 12 mg/dL (ref 8–27)
Bilirubin Total: 0.6 mg/dL (ref 0.0–1.2)
CO2: 24 mmol/L (ref 20–29)
Calcium: 9.3 mg/dL (ref 8.6–10.2)
Chloride: 94 mmol/L — ABNORMAL LOW (ref 96–106)
Creatinine, Ser: 1.29 mg/dL — ABNORMAL HIGH (ref 0.76–1.27)
GFR calc Af Amer: 62 mL/min/{1.73_m2} (ref >59)
GFR calc non Af Amer: 54 mL/min/{1.73_m2} — ABNORMAL LOW (ref >59)
Globulin, Total: 2.6 g/dL (ref 1.5–4.5)
Glucose: 94 mg/dL (ref 65–99)
Potassium: 4.7 mmol/L (ref 3.5–5.2)
Sodium: 131 mmol/L — ABNORMAL LOW (ref 134–144)
Total Protein: 6.6 g/dL (ref 6.0–8.5)

## 2020-09-28 LAB — LIPID PANEL
Chol/HDL Ratio: 3.7 ratio (ref 0.0–5.0)
Cholesterol, Total: 175 mg/dL (ref 100–199)
HDL: 47 mg/dL (ref >39)
LDL Chol Calc (NIH): 105 mg/dL — ABNORMAL HIGH (ref 0–99)
Triglycerides: 129 mg/dL (ref 0–149)
VLDL Cholesterol Cal: 23 mg/dL (ref 5–40)

## 2020-09-28 LAB — CARDIOVASCULAR RISK ASSESSMENT

## 2020-09-28 NOTE — Progress Notes (Signed)
Wbc 11,00 any infection, kidney tests slightly low we will follow, liver tests normal, LDL 105,  lp

## 2020-10-09 ENCOUNTER — Other Ambulatory Visit: Payer: Self-pay | Admitting: Legal Medicine

## 2020-12-17 ENCOUNTER — Other Ambulatory Visit: Payer: Self-pay | Admitting: Cardiology

## 2021-01-25 ENCOUNTER — Telehealth: Payer: Self-pay | Admitting: Cardiology

## 2021-01-25 MED ORDER — EZETIMIBE 10 MG PO TABS: ORAL_TABLET | ORAL | 2 refills | Status: DC

## 2021-01-25 NOTE — Telephone Encounter (Signed)
*  STAT* If patient is at the pharmacy, call can be transferred to refill team.   1. Which medications need to be refilled? (please list name of each medication and dose if known)  ezetimibe (ZETIA) 10 MG tablet  2. Which pharmacy/location (including street and city if local pharmacy) is medication to be sent to? [id number] - [place], [place] - [address] AT [address]  3. Do they need a 30 day or 90 day supply? 90 with refills   Patient is out of medication

## 2021-03-27 ENCOUNTER — Other Ambulatory Visit: Payer: Self-pay

## 2021-03-27 ENCOUNTER — Ambulatory Visit (INDEPENDENT_AMBULATORY_CARE_PROVIDER_SITE_OTHER): Payer: Medicare Other | Admitting: Legal Medicine

## 2021-03-27 ENCOUNTER — Encounter: Payer: Self-pay | Admitting: Legal Medicine

## 2021-03-27 VITALS — BP 118/62 | HR 72 | Temp 97.5°F | Ht 61.0 in | Wt 240.8 lb

## 2021-03-27 DIAGNOSIS — M1A00X0 Idiopathic chronic gout, unspecified site, without tophus (tophi): Secondary | ICD-10-CM

## 2021-03-27 DIAGNOSIS — M545 Low back pain, unspecified: Secondary | ICD-10-CM | POA: Diagnosis not present

## 2021-03-27 DIAGNOSIS — E785 Hyperlipidemia, unspecified: Secondary | ICD-10-CM

## 2021-03-27 DIAGNOSIS — H353112 Nonexudative age-related macular degeneration, right eye, intermediate dry stage: Secondary | ICD-10-CM

## 2021-03-27 DIAGNOSIS — I25119 Atherosclerotic heart disease of native coronary artery with unspecified angina pectoris: Secondary | ICD-10-CM | POA: Diagnosis not present

## 2021-03-27 DIAGNOSIS — M1A9XX0 Chronic gout, unspecified, without tophus (tophi): Secondary | ICD-10-CM

## 2021-03-27 DIAGNOSIS — Z6841 Body Mass Index (BMI) 40.0 and over, adult: Secondary | ICD-10-CM

## 2021-03-27 DIAGNOSIS — M47816 Spondylosis without myelopathy or radiculopathy, lumbar region: Secondary | ICD-10-CM | POA: Diagnosis not present

## 2021-03-27 DIAGNOSIS — J4489 Other specified chronic obstructive pulmonary disease: Secondary | ICD-10-CM

## 2021-03-27 DIAGNOSIS — D692 Other nonthrombocytopenic purpura: Secondary | ICD-10-CM

## 2021-03-27 DIAGNOSIS — E875 Hyperkalemia: Secondary | ICD-10-CM | POA: Diagnosis not present

## 2021-03-27 DIAGNOSIS — I10 Essential (primary) hypertension: Secondary | ICD-10-CM | POA: Diagnosis not present

## 2021-03-27 DIAGNOSIS — J449 Chronic obstructive pulmonary disease, unspecified: Secondary | ICD-10-CM | POA: Diagnosis not present

## 2021-03-27 HISTORY — DX: Morbid (severe) obesity due to excess calories: E66.01

## 2021-03-27 HISTORY — DX: Other nonthrombocytopenic purpura: D69.2

## 2021-03-27 NOTE — Progress Notes (Addendum)
Established Patient Office Visit  Subjective:  Patient ID: Joseph Mercado, male    DOB: 1945-12-31  Age: 75 y.o. MRN: [id number]  CC:  Chief Complaint  Patient presents with   Hyperlipidemia   Hypertension    HPI Joseph Mercado presents for chronic visit  Patient presents for follow up of hypertension.  Patient tolerating lisinopril, metoprolol well with side effects.  Patient was diagnosed with hypertension 2010 so has been treated for hypertension for 10 years.Patient is working on maintaining diet and exercise regimen and follows up as directed. Complication include CAD  Patient presents with hyperlipidemia.  Compliance with treatment has been good; patient takes medicines as directed, maintains low cholesterol diet, follows up as directed, and maintains exercise regimen.  Patient is using zetia without problems. .     Past Medical History:  Diagnosis Date   Angina pectoris (HCC)    CAD (coronary artery disease)    a. 02/2017: NSTEMI with cath showing 100% Prox Cx stenosis with 95% Mid Cx stenosis and 45% mid-LAD stenosis. Unsuccessful attempt at crossing an occluded mid nondominant circumflex --> plan for staged PCI in coming weeks   Chest pain 02/2017   Dyspnea    GERD (gastroesophageal reflux disease)    High cholesterol    History of gout    Hypertension    Macular degeneration    NSTEMI (non-ST elevated myocardial infarction) (HCC) 02/2017   Pericardial effusion 04/2017    Past Surgical History:  Procedure Laterality Date   CATARACT EXTRACTION W/ INTRAOCULAR LENS  IMPLANT, BILATERAL Bilateral    CORONARY ANGIOPLASTY     CORONARY BALLOON ANGIOPLASTY N/A 03/10/2017   Procedure: Coronary Balloon Angioplasty;  Surgeon: Runell Gess, MD;  Location: [facility] INVASIVE CV LAB;  Service: Cardiovascular;  Laterality: N/A;  CFX   CORONARY CTO INTERVENTION N/A 05/14/2017   Procedure: CORONARY CTO INTERVENTION;  Surgeon: Swaziland, Peter M, MD;  Location: [facility] INVASIVE CV LAB;  Service:  Cardiovascular;  Laterality: N/A;   LEFT HEART CATH AND CORONARY ANGIOGRAPHY N/A 03/10/2017   Procedure: Left Heart Cath and Coronary Angiography;  Surgeon: Runell Gess, MD;  Location: [facility] INVASIVE CV LAB;  Service: Cardiovascular;  Laterality: N/A;   PERICARDIOCENTESIS N/A 05/14/2017   Procedure: PERICARDIOCENTESIS;  Surgeon: Swaziland, Peter M, MD;  Location: [facility] INVASIVE CV LAB;  Service: Cardiovascular;  Laterality: N/A;   SUBXYPHOID PERICARDIAL WINDOW N/A 05/15/2017   Procedure: SUBXYPHOID PERICARDIAL WINDOW WITH DRAINAGE OF PERICARDIAL EFFUSION;  Surgeon: Alleen Borne, MD;  Location: [facility] OR;  Service: Thoracic;  Laterality: N/A;   ULTRASOUND GUIDANCE FOR VASCULAR ACCESS  05/14/2017   Procedure: Ultrasound Guidance For Vascular Access;  Surgeon: Swaziland, Peter M, MD;  Location: [facility] INVASIVE CV LAB;  Service: Cardiovascular;;    Family History  Problem Relation Age of Onset   CAD Brother        CABG in his 1's    Social History   Socioeconomic History   Marital status: Divorced    Spouse name: Not on file   Number of children: Not on file   Years of education: Not on file   Highest education level: Not on file  Occupational History   Not on file  Tobacco Use   Smoking status: Former    Packs/day: 3.00    Years: 20.00    Pack years: 60.00    Types: Cigarettes   Smokeless tobacco: Former    Types: Chew   Tobacco comments:    "quit smoking in the 1980s;  chewed 4-5 years after I quit smoking"  Vaping Use   Vaping Use: Never used  Substance and Sexual Activity   Alcohol use: Yes    Alcohol/week: 70.0 standard drinks    Types: 70 Cans of beer per week    Comment: 05/14/2017 "6-10 beers/day"   Drug use: No   Sexual activity: Not on file  Other Topics Concern   Not on file  Social History Narrative   Not on file   Social Determinants of Health   Financial Resource Strain: Not on file  Food Insecurity: Not on file  Transportation Needs: Not on file  Physical Activity:  Not on file  Stress: Not on file  Social Connections: Not on file  Intimate Partner Violence: Not on file    Outpatient Medications Prior to Visit  Medication Sig Dispense Refill   allopurinol (ZYLOPRIM) 100 MG tablet TAKE 1 TABLET(100 MG) BY MOUTH TWICE DAILY 180 tablet 2   aspirin 81 MG EC tablet Take 1 tablet (81 mg total) by mouth every other day. Resume on 05/20/17. 30 tablet 12   ezetimibe (ZETIA) 10 MG tablet TAKE 1 TABLET(10 MG) BY MOUTH DAILY 90 tablet 2   isosorbide mononitrate (IMDUR) 30 MG 24 hr tablet TAKE 1 TABLET BY MOUTH DAILY 90 tablet 3   lisinopril (ZESTRIL) 20 MG tablet TAKE 1 TABLET(20 MG) BY MOUTH DAILY 90 tablet 2   metoprolol succinate (TOPROL-XL) 50 MG 24 hr tablet TAKE 1 TABLET BY MOUTH TWICE DAILY WITH FOOD 180 tablet 3   Multiple Vitamins-Minerals (PRESERVISION AREDS PO) Take 1 capsule by mouth 2 (two) times daily.     nitroGLYCERIN (NITROSTAT) 0.4 MG SL tablet Place 1 tablet (0.4 mg total) under the tongue every 5 (five) minutes as needed for chest pain. 25 tablet 6   pantoprazole (PROTONIX) 40 MG tablet TAKE 1 TABLET(40 MG) BY MOUTH DAILY 90 tablet 2   pravastatin (PRAVACHOL) 20 MG tablet TAKE 1 TABLET BY MOUTH DAILY 90 tablet 2   pravastatin (PRAVACHOL) 20 MG tablet TAKE 1 TABLET BY MOUTH DAILY 90 tablet 0   No facility-administered medications prior to visit.    No Known Allergies  ROS Review of Systems  Constitutional:  Negative for chills, fatigue and fever.  HENT:  Negative for congestion, ear pain and sore throat.   Respiratory:  Negative for cough and shortness of breath.   Cardiovascular:  Negative for chest pain.  Gastrointestinal:  Negative for abdominal pain, constipation, diarrhea, nausea and vomiting.  Endocrine: Negative for polydipsia, polyphagia and polyuria.  Genitourinary:  Negative for dysuria and frequency.  Musculoskeletal:  Positive for back pain. Negative for arthralgias and myalgias.  Skin: Negative.   Neurological:  Negative  for dizziness and headaches.  Psychiatric/Behavioral:  Negative for dysphoric mood.        No dysphoria     Objective:    Physical Exam Vitals reviewed.  Constitutional:      Appearance: Normal appearance.  HENT:     Head: Normocephalic.     Right Ear: Tympanic membrane, ear canal and external ear normal.     Left Ear: Tympanic membrane, ear canal and external ear normal.     Mouth/Throat:     Mouth: Mucous membranes are moist.     Pharynx: Oropharynx is clear.  Eyes:     Extraocular Movements: Extraocular movements intact.     Conjunctiva/sclera: Conjunctivae normal.     Pupils: Pupils are equal, round, and reactive to light.  Cardiovascular:  Rate and Rhythm: Normal rate and regular rhythm.     Pulses: Normal pulses.     Heart sounds: Normal heart sounds. No murmur heard.   No gallop.  Pulmonary:     Effort: Pulmonary effort is normal. No respiratory distress.     Breath sounds: Normal breath sounds. No wheezing.  Abdominal:     General: Abdomen is flat. Bowel sounds are normal. There is no distension.     Palpations: Abdomen is soft.     Tenderness: There is no abdominal tenderness.  Musculoskeletal:     Cervical back: Normal range of motion.     Right lower leg: No edema.     Left lower leg: No edema.     Comments: Chronic back pain  Skin:    General: Skin is warm and dry.     Capillary Refill: Capillary refill takes less than 2 seconds.  Neurological:     General: No focal deficit present.     Mental Status: He is alert and oriented to person, place, and time. Mental status is at baseline.  Psychiatric:        Mood and Affect: Mood normal.    BP 118/62   Pulse 72   Temp (!) 97.5 F (36.4 C)   Ht 5\' 1"  (1.549 m)   Wt 240 lb 12.8 oz (109.2 kg)   SpO2 96%   BMI 45.50 kg/m  Wt Readings from Last 3 Encounters:  03/27/21 240 lb 12.8 oz (109.2 kg)  09/27/20 240 lb (108.9 kg)  08/22/20 246 lb 9.6 oz (111.9 kg)     Health Maintenance Due  Topic Date  Due   Hepatitis C Screening  Never done   TETANUS/TDAP  Never done   Zoster Vaccines- Shingrix (1 of 2) Never done   PNA vac Low Risk Adult (1 of 2 - PCV13) Never done   COVID-19 Vaccine (4 - Booster for Moderna series) 10/14/2020   INFLUENZA VACCINE  03/19/2021    There are no preventive care reminders to display for this patient.  Lab Results  Component Value Date   TSH 3.881 05/20/2017   Lab Results  Component Value Date   WBC 11.2 (H) 09/27/2020   HGB 13.4 09/27/2020   HCT 42.0 09/27/2020   MCV 82 09/27/2020   PLT 207 09/27/2020   Lab Results  Component Value Date   NA 131 (L) 09/27/2020   K 4.7 09/27/2020   CO2 24 09/27/2020   GLUCOSE 94 09/27/2020   BUN 12 09/27/2020   CREATININE 1.29 (H) 09/27/2020   BILITOT 0.6 09/27/2020   ALKPHOS 103 09/27/2020   AST 23 09/27/2020   ALT 16 09/27/2020   PROT 6.6 09/27/2020   ALBUMIN 4.0 09/27/2020   CALCIUM 9.3 09/27/2020   ANIONGAP 8 05/21/2017   Lab Results  Component Value Date   CHOL 175 09/27/2020   Lab Results  Component Value Date   HDL 47 09/27/2020   Lab Results  Component Value Date   LDLCALC 105 (H) 09/27/2020   Lab Results  Component Value Date   TRIG 129 09/27/2020   Lab Results  Component Value Date   CHOLHDL 3.7 09/27/2020   Lab Results  Component Value Date   HGBA1C 4.7 (L) 05/20/2017      Assessment & Plan:   Problem List Items Addressed This Visit       Cardiovascular and Mediastinum   Essential hypertension - Primary   Relevant Orders   CBC with Differential  Comprehensive metabolic panel   AMB Referral to [facility] Coordinaton An individual hypertension care plan was established and reinforced today.  The patient's status was assessed using clinical findings on exam and labs or diagnostic tests. The patient's success at meeting treatment goals on disease specific evidence-based guidelines and found to be well controlled. SELF MANAGEMENT: The patient and I together  assessed ways to personally work towards obtaining the recommended goals. RECOMMENDATIONS: avoid decongestants found in common cold remedies, decrease consumption of alcohol, perform routine monitoring of BP with home BP cuff, exercise, reduction of dietary salt, take medicines as prescribed, try not to miss doses and quit smoking.  Regular exercise and maintaining a healthy weight is needed.  Stress reduction may help. A CLINICAL SUMMARY including written plan identify barriers to care unique to individual due to social or financial issues.  We attempt to mutually creat solutions for individual and family understanding.     CAD (coronary artery disease)   Relevant Orders   AMB Referral to [facility] Coordinaton An individual plan was formulated based on patient history and exam, labs and evidence based data. Patient has not had recent angina or nitroglycerin use. continue present treatment.    Senile purpura (HCC) Patient has chromic senile purpura     Respiratory   Obstructive chronic bronchitis without exacerbation (HCC)   Relevant Orders   AMB Referral to [facility] Coordinaton An individualize plan was formulated for care of COPD.  Treatment is evidence based.  She will continue on inhalers, avoid smoking and smoke.  Regular exercise with help with dyspnea. Routine follow ups and medication compliance is needed.      Musculoskeletal and Integument   Chronic gouty arthritis   Relevant Orders   Uric acid AN INDIVIDUAL CARE PLAN FOR GOUTwas established and reinforced today.  The patient's status was assessed using clinical findings on exam, labs, and other diagnostic testing. Patient's success at meeting treatment goals based on disease specific evidence-bassed guidelines and found to be in good control. RECOMMENDATIONS include maintaining present medicines and treatment.      Other   Hyperlipidemia LDL goal <70   Relevant Orders   Lipid panel AN INDIVIDUAL CARE PLAN for  hyperlipidemia/ cholesterol was established and reinforced today.  The patient's status was assessed using clinical findings on exam, lab and other diagnostic tests. The patient's disease status was assessed based on evidence-based guidelines and found to be fair controlled. MEDICATIONS were reviewed. SELF MANAGEMENT GOALS have been discussed and patient's success at attaining the goal of low cholesterol was assessed. RECOMMENDATION given include regular exercise 3 days a week and low cholesterol/low fat diet. CLINICAL SUMMARY including written plan to identify barriers unique to the patient due to social or economic  reasons was discussed.     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  An evidence based program was formulated.  Patient is to cut portion size with meals and to plan physical exercise 3 days a week at least 20 minutes.  Weight watchers and other programs are helpful.  Planned amount of weight loss 10 lbs.     Morbid obesity (HCC)   Relevant Orders   AMB Referral to [facility] Coordinaton An individualize plan was formulated for obesity using patient history and physical exam to encourage weight loss.  An evidence based program was formulated.  Patient is to cut portion sizes, use smaller plates with meals and to plan physical exercise 3 days a week at least 20 minutes.  Weight watchers and other programs are helpful.  Planned amount of weight loss 10 lbs.    Other Visit Diagnoses     Hyperkalemia     Potassium rechecked today    Lumbar spondylosis       Relevant Orders   DG Lumbar Spine Complete Chronic LBP, patient does not want workup at present     30 minute visit plus review of old records    Follow-up: Return in about 6 months (around  09/27/2021) for fasting.    Brent Bulla, MD

## 2021-03-28 LAB — COMPREHENSIVE METABOLIC PANEL
ALT: 14 IU/L (ref 0–44)
AST: 16 IU/L (ref 0–40)
Albumin/Globulin Ratio: 1.3 (ref 1.2–2.2)
Albumin: 3.7 g/dL (ref 3.7–4.7)
Alkaline Phosphatase: 111 IU/L (ref 44–121)
BUN/Creatinine Ratio: 8 — ABNORMAL LOW (ref 10–24)
BUN: 9 mg/dL (ref 8–27)
Bilirubin Total: 0.6 mg/dL (ref 0.0–1.2)
CO2: 24 mmol/L (ref 20–29)
Calcium: 9.3 mg/dL (ref 8.6–10.2)
Chloride: 97 mmol/L (ref 96–106)
Creatinine, Ser: 1.14 mg/dL (ref 0.76–1.27)
Globulin, Total: 2.8 g/dL (ref 1.5–4.5)
Glucose: 95 mg/dL (ref 65–99)
Potassium: 4.9 mmol/L (ref 3.5–5.2)
Sodium: 133 mmol/L — ABNORMAL LOW (ref 134–144)
Total Protein: 6.5 g/dL (ref 6.0–8.5)
eGFR: 67 mL/min/{1.73_m2} (ref >59)

## 2021-03-28 LAB — LIPID PANEL
Chol/HDL Ratio: 3.3 ratio (ref 0.0–5.0)
Cholesterol, Total: 142 mg/dL (ref 100–199)
HDL: 43 mg/dL (ref >39)
LDL Chol Calc (NIH): 80 mg/dL (ref 0–99)
Triglycerides: 103 mg/dL (ref 0–149)
VLDL Cholesterol Cal: 19 mg/dL (ref 5–40)

## 2021-03-28 LAB — CBC WITH DIFFERENTIAL/PLATELET
Basophils Absolute: 0.1 10*3/uL (ref 0.0–0.2)
Basos: 1 %
EOS (ABSOLUTE): 0.2 10*3/uL (ref 0.0–0.4)
Eos: 2 %
Hematocrit: 42.9 % (ref 37.5–51.0)
Hemoglobin: 13.5 g/dL (ref 13.0–17.7)
Immature Grans (Abs): 0.1 10*3/uL (ref 0.0–0.1)
Immature Granulocytes: 1 %
Lymphocytes Absolute: 2 10*3/uL (ref 0.7–3.1)
Lymphs: 19 %
MCH: 26 pg — ABNORMAL LOW (ref 26.6–33.0)
MCHC: 31.5 g/dL (ref 31.5–35.7)
MCV: 83 fL (ref 79–97)
Monocytes Absolute: 0.8 10*3/uL (ref 0.1–0.9)
Monocytes: 8 %
Neutrophils Absolute: 7.1 10*3/uL — ABNORMAL HIGH (ref 1.4–7.0)
Neutrophils: 69 %
Platelets: 238 10*3/uL (ref 150–450)
RBC: 5.19 x10E6/uL (ref 4.14–5.80)
RDW: 14.7 % (ref 11.6–15.4)
WBC: 10.1 10*3/uL (ref 3.4–10.8)

## 2021-03-28 LAB — URIC ACID: Uric Acid: 4.4 mg/dL (ref 3.8–8.4)

## 2021-03-28 LAB — CARDIOVASCULAR RISK ASSESSMENT

## 2021-03-28 NOTE — Progress Notes (Signed)
Uric acid 4.4, Cholesterol normal, CBC normal, kidney and liver tests normal,  lp

## 2021-05-31 ENCOUNTER — Encounter: Payer: Self-pay | Admitting: Legal Medicine

## 2021-05-31 ENCOUNTER — Ambulatory Visit (INDEPENDENT_AMBULATORY_CARE_PROVIDER_SITE_OTHER): Payer: Medicare Other

## 2021-05-31 ENCOUNTER — Other Ambulatory Visit: Payer: Self-pay

## 2021-05-31 DIAGNOSIS — Z23 Encounter for immunization: Secondary | ICD-10-CM | POA: Diagnosis not present

## 2021-05-31 NOTE — Progress Notes (Signed)
Covid-19 Vaccination Clinic  Name:  Joseph Mercado    MRN: [id number] DOB: 03-Mar-1946  05/31/2021  [name] was observed post Covid-19 immunization for 15 minutes without incident. He was provided with Vaccine Information Sheet and instruction to access the V-Safe system.   [name] was instructed to call 911 with any severe reactions post vaccine: Difficulty breathing  Swelling of face and throat  A fast heartbeat  A bad rash all over body  Dizziness and weakness

## 2021-06-07 ENCOUNTER — Other Ambulatory Visit: Payer: Self-pay | Admitting: Legal Medicine

## 2021-06-07 ENCOUNTER — Other Ambulatory Visit: Payer: Self-pay | Admitting: Cardiology

## 2021-07-05 DIAGNOSIS — H353131 Nonexudative age-related macular degeneration, bilateral, early dry stage: Secondary | ICD-10-CM | POA: Diagnosis not present

## 2021-07-05 DIAGNOSIS — H26493 Other secondary cataract, bilateral: Secondary | ICD-10-CM | POA: Diagnosis not present

## 2021-07-14 ENCOUNTER — Other Ambulatory Visit: Payer: Self-pay | Admitting: Legal Medicine

## 2021-07-20 ENCOUNTER — Telehealth: Payer: Self-pay | Admitting: Cardiology

## 2021-07-20 MED ORDER — ISOSORBIDE MONONITRATE ER 30 MG PO TB24: 30.0000 mg | ORAL_TABLET | Freq: Every day | ORAL | 1 refills | Status: DC

## 2021-07-20 NOTE — Telephone Encounter (Signed)
Called the patient and informed him that Rx for Imdur 30 mg daily was sent to his pharmacy on file and that Rx was enough to get him through until his January appointment with Dr. Martinique. Patient thanked me for calling and verbalized understanding all (if any) questions were answered.

## 2021-07-20 NOTE — Telephone Encounter (Signed)
*  STAT* If patient is at the pharmacy, call can be transferred to refill team.   1. Which medications need to be refilled? (please list name of each medication and dose if known) isosorbide mononitrate (IMDUR) 30 MG 24 hr tablet  2. Which pharmacy/location (including street and city if local pharmacy) is medication to be sent to? [id number] - [place], [place] - [address] AT [place] [address]  3. Do they need a 30 day or 90 day supply? [place]

## 2021-08-02 ENCOUNTER — Telehealth: Payer: Self-pay | Admitting: Cardiology

## 2021-08-02 NOTE — Telephone Encounter (Signed)
Pt would like to do a provider switch due to location  From: Dr [place] at [facility]  To: DR Agustin Cree in [place] number [phone]

## 2021-08-27 ENCOUNTER — Ambulatory Visit: Payer: Medicare Other | Admitting: Cardiology

## 2021-08-27 DIAGNOSIS — H3530 Unspecified macular degeneration: Secondary | ICD-10-CM | POA: Insufficient documentation

## 2021-08-27 DIAGNOSIS — Z8739 Personal history of other diseases of the musculoskeletal system and connective tissue: Secondary | ICD-10-CM | POA: Insufficient documentation

## 2021-08-27 DIAGNOSIS — K219 Gastro-esophageal reflux disease without esophagitis: Secondary | ICD-10-CM

## 2021-08-27 HISTORY — DX: Gastro-esophageal reflux disease without esophagitis: K21.9

## 2021-08-28 ENCOUNTER — Other Ambulatory Visit: Payer: Self-pay

## 2021-08-28 ENCOUNTER — Encounter: Payer: Self-pay | Admitting: Cardiology

## 2021-08-28 ENCOUNTER — Ambulatory Visit: Payer: Medicare Other | Admitting: Cardiology

## 2021-08-28 VITALS — BP 134/76 | HR 86 | Ht 71.0 in | Wt 245.4 lb

## 2021-08-28 DIAGNOSIS — I10 Essential (primary) hypertension: Secondary | ICD-10-CM | POA: Diagnosis not present

## 2021-08-28 DIAGNOSIS — I25119 Atherosclerotic heart disease of native coronary artery with unspecified angina pectoris: Secondary | ICD-10-CM | POA: Diagnosis not present

## 2021-08-28 DIAGNOSIS — R0609 Other forms of dyspnea: Secondary | ICD-10-CM

## 2021-08-28 DIAGNOSIS — I209 Angina pectoris, unspecified: Secondary | ICD-10-CM

## 2021-08-28 DIAGNOSIS — Z6841 Body Mass Index (BMI) 40.0 and over, adult: Secondary | ICD-10-CM

## 2021-08-28 DIAGNOSIS — E785 Hyperlipidemia, unspecified: Secondary | ICD-10-CM | POA: Diagnosis not present

## 2021-08-28 MED ORDER — ISOSORBIDE MONONITRATE ER 60 MG PO TB24: 60.0000 mg | ORAL_TABLET | Freq: Every day | ORAL | 3 refills | Status: DC

## 2021-08-28 NOTE — Progress Notes (Signed)
Cardiology Office Note:    Date:  08/28/2021   ID:  Joseph Mercado, DOB [birth year], MRN [id number]  PCP:  Abigail Miyamoto, MD  Cardiologist:  Gypsy Balsam, MD    Referring MD: Abigail Miyamoto,*   Chief Complaint  Patient presents with   elevated HR    When heart rate is in the 90 or above he feels chest pain  I am doing fine but I have stressed pain  History of Present Illness:    Joseph Mercado is a 76 y.o. male  who is seen for follow up CAD. He has a history of HTN and family history of CAD.  Seen in June 2018 with a several month history of progressive DOE and chest pain on exertion. Echo showed mild LVH with grade 2 diastolic dysfunction. Valves were OK. Myoview study showed inferolateral ischemia. This led to a cardiac cath on 03/10/17 which showed CTO of the LCx with collaterals to a fairly large OM. PCI was attempted but unable to cross completely into the large OM so unable to recanalize the vessel. It was noted that this was a long and difficult procedure with radiation dose of 4 gray. When seen in follow up he was still having some intermittent chest pain and after reviewing options decided to proceed with attempt at CTO PCI. This was performed on 05/14/17. We were unable to complete the procedure. We were able to cross the lesion in subintimal tract but never able to reenter the true lumen distally. He had no problems during the procedure but about 4 hours later developed acute hypotension and chest pain. Echo revealed a pericardial effusion. He underwent emergent pericardiocentesis with removal of 500 cc of blood. He stabilized and was observed overnight. No drainage from catheter and Echo showed minimal effusion. The catheter was withdrawn and the patient again developed acute hemodynamic instability. He was taken to the OR and a pericardial window was performed. There was no active bleeding site identified. His post op course was uncomplicated. He comes today to my office  for follow-up.  He would like to be established as a patient my practice since is simply closer for him to come here.  He overall seems to be doing quite well he try to exercise on the treadmill on the regular basis but over the years he did notice decreased ability to exercise.  He used to walk up to 40 minutes on the treadmill now is about 20 he blames all this on COVID.  Using masks.  He describes the fact that when he walks he will develop tightness in the chest.  He did not take nitroglycerin for it and he simply scared to go faster what he does when he walks on the treadmill and he watch his heart rate very carefully and he is aware to exceed 100 bpm because typically at that time he developed some chest pain.  Past Medical History:  Diagnosis Date   Angina pectoris (HCC)    CAD (coronary artery disease)    a. 02/2017: NSTEMI with cath showing 100% Prox Cx stenosis with 95% Mid Cx stenosis and 45% mid-LAD stenosis. Unsuccessful attempt at crossing an occluded mid nondominant circumflex --> plan for staged PCI in coming weeks   Chest pain 02/2017   Dyspnea    GERD (gastroesophageal reflux disease)    High cholesterol    History of gout    Hypertension    Macular degeneration    NSTEMI (non-ST elevated myocardial infarction) (HCC) 02/2017  Pericardial effusion 04/2017    Past Surgical History:  Procedure Laterality Date   CATARACT EXTRACTION W/ INTRAOCULAR LENS  IMPLANT, BILATERAL Bilateral    CORONARY ANGIOPLASTY     CORONARY BALLOON ANGIOPLASTY N/A 03/10/2017   Procedure: Coronary Balloon Angioplasty;  Surgeon: Runell Gess, MD;  Location: [facility] INVASIVE CV LAB;  Service: Cardiovascular;  Laterality: N/A;  CFX   CORONARY CTO INTERVENTION N/A 05/14/2017   Procedure: CORONARY CTO INTERVENTION;  Surgeon: Swaziland, Peter M, MD;  Location: [facility] INVASIVE CV LAB;  Service: Cardiovascular;  Laterality: N/A;   LEFT HEART CATH AND CORONARY ANGIOGRAPHY N/A 03/10/2017   Procedure: Left Heart Cath  and Coronary Angiography;  Surgeon: Runell Gess, MD;  Location: [facility] INVASIVE CV LAB;  Service: Cardiovascular;  Laterality: N/A;   PERICARDIOCENTESIS N/A 05/14/2017   Procedure: PERICARDIOCENTESIS;  Surgeon: Swaziland, Peter M, MD;  Location: [facility] INVASIVE CV LAB;  Service: Cardiovascular;  Laterality: N/A;   SUBXYPHOID PERICARDIAL WINDOW N/A 05/15/2017   Procedure: SUBXYPHOID PERICARDIAL WINDOW WITH DRAINAGE OF PERICARDIAL EFFUSION;  Surgeon: Alleen Borne, MD;  Location: [facility] OR;  Service: Thoracic;  Laterality: N/A;   ULTRASOUND GUIDANCE FOR VASCULAR ACCESS  05/14/2017   Procedure: Ultrasound Guidance For Vascular Access;  Surgeon: Swaziland, Peter M, MD;  Location: [facility] INVASIVE CV LAB;  Service: Cardiovascular;;    Current Medications: Current Meds  Medication Sig   allopurinol (ZYLOPRIM) 100 MG tablet Take 100 mg by mouth 2 (two) times daily.   aspirin 81 MG EC tablet Take 1 tablet (81 mg total) by mouth every other day. Resume on 05/20/17.   ezetimibe (ZETIA) 10 MG tablet Take 10 mg by mouth daily.   isosorbide mononitrate (IMDUR) 30 MG 24 hr tablet Take 1 tablet (30 mg total) by mouth daily.   lisinopril (ZESTRIL) 20 MG tablet Take 20 mg by mouth daily.   metoprolol succinate (TOPROL-XL) 50 MG 24 hr tablet Take 50 mg by mouth daily. Take with or immediately following a meal.   Multiple Vitamins-Minerals (PRESERVISION AREDS PO) Take 1 capsule by mouth 2 (two) times daily. Unknown strenght   nitroGLYCERIN (NITROSTAT) 0.4 MG SL tablet Place 1 tablet (0.4 mg total) under the tongue every 5 (five) minutes as needed for chest pain.   pantoprazole (PROTONIX) 40 MG tablet Take 40 mg by mouth daily.   pravastatin (PRAVACHOL) 20 MG tablet Take 20 mg by mouth daily.     Allergies:   Patient has no known allergies.   Social History   Socioeconomic History   Marital status: Divorced    Spouse name: Not on file   Number of children: Not on file   Years of education: Not on file   Highest education  level: Not on file  Occupational History   Not on file  Tobacco Use   Smoking status: Former    Packs/day: 3.00    Years: 20.00    Pack years: 60.00    Types: Cigarettes   Smokeless tobacco: Former    Types: Chew   Tobacco comments:    "quit smoking in the 1980s; chewed 4-5 years after I quit smoking"  Vaping Use   Vaping Use: Never used  Substance and Sexual Activity   Alcohol use: Yes    Alcohol/week: 70.0 standard drinks    Types: 70 Cans of beer per week    Comment: 05/14/2017 "6-10 beers/day"   Drug use: No   Sexual activity: Not on file  Other Topics Concern   Not on file  Social History Narrative   Not on file   Social Determinants of Health   Financial Resource Strain: Not on file  Food Insecurity: Not on file  Transportation Needs: Not on file  Physical Activity: Not on file  Stress: Not on file  Social Connections: Not on file     Family History: The patient's family history includes CAD in his brother. ROS:   Please see the history of present illness.    All 14 point review of systems negative except as described per history of present illness  EKGs/Labs/Other Studies Reviewed:      Recent Labs: 03/27/2021: ALT 14; BUN 9; Creatinine, Ser 1.14; Hemoglobin 13.5; Platelets 238; Potassium 4.9; Sodium 133  Recent Lipid Panel    Component Value Date/Time   CHOL 142 03/27/2021 0825   TRIG 103 03/27/2021 0825   HDL 43 03/27/2021 0825   CHOLHDL 3.3 03/27/2021 0825   CHOLHDL 3.3 03/11/2017 0214   VLDL 14 03/11/2017 0214   LDLCALC 80 03/27/2021 0825    Physical Exam:    VS:  BP 134/76 (BP Location: Right Arm, Patient Position: Sitting)    Pulse 86    Ht 5\' 11"  (1.803 m)    Wt 245 lb 6.4 oz (111.3 kg)    SpO2 97%    BMI 34.23 kg/m     Wt Readings from Last 3 Encounters:  08/28/21 245 lb 6.4 oz (111.3 kg)  03/27/21 240 lb 12.8 oz (109.2 kg)  09/27/20 240 lb (108.9 kg)     GEN:  Well nourished, well developed in no acute distress HEENT: Normal NECK:  No JVD; No carotid bruits LYMPHATICS: No lymphadenopathy CARDIAC: RRR, no murmurs, no rubs, no gallops RESPIRATORY:  Clear to auscultation without rales, wheezing or rhonchi  ABDOMEN: Soft, non-tender, non-distended MUSCULOSKELETAL:  No edema; No deformity  SKIN: Warm and dry LOWER EXTREMITIES: no swelling NEUROLOGIC:  Alert and oriented x 3 PSYCHIATRIC:  Normal affect   ASSESSMENT:    1. Coronary artery disease involving native coronary artery of native heart with angina pectoris (HCC)   2. Essential hypertension   3. Hyperlipidemia LDL goal <70   4. Angina pectoris (HCC)   5. Dyspnea on exertion   6. BMI 45.0-49.9, adult (HCC)    PLAN:    In order of problems listed above:  Coronary artery disease completely regular circumflex artery with unsuccessful  tries to open the artery.  That was complicated with tamponade.  He appears to have stable angina pectoris.  I did talk about options for the situation.  I will increase dose of long-acting nitroglycerin.  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  He does have nitroglycerin that he need to take on as-needed basis also encouraged him to push himself a little bit if he will not develop any chest pain. Dyspnea on exertion could be multifactorial I will ask him to have echocardiogram done to recheck left ventricle ejection fraction. Essential hypertension, blood pressure seems to well controlled continue present medications. Dyslipidemia he had intolerance to statin the only thing he can tolerate is pravastatin 20 which I will continue.  He is also on Zetia.  I will check his fasting lipid profile.  I did review his K PN which show LDL of 80 HDL 43. Obesity obviously significant problem he was strongly advised to be able be more active if no chest pain and hopefully lose some weight  Medication Adjustments/Labs and Tests Ordered: Current medicines  are reviewed at length with the patient today.  Concerns regarding medicines are outlined above.  Orders Placed This Encounter  Procedures   EKG 12-Lead   Medication changes: No orders of the defined types were placed in this encounter.   Signed, Georgeanna Lea, MD, [facility] 08/28/2021 10:51 AM    [facility]

## 2021-08-28 NOTE — Patient Instructions (Addendum)
Medication Instructions:  Your physician has recommended you make the following change in your medication:   INCREASE the Imdur to 60 mg taking  1 daily.  You can take 2 of th 30 mg tablets to use them up   *If you need a refill on your cardiac medications before your next appointment, please call your pharmacy*   Lab Work: None ordered  If you have labs (blood work) drawn today and your tests are completely normal, you will receive your results only by: [place] (if you have MyChart) OR A paper copy in the mail If you have any lab test that is abnormal or we need to change your treatment, we will call you to review the results.   Testing/Procedures: Your physician has requested that you have an echocardiogram. Echocardiography is a painless test that uses sound waves to create images of your heart. It provides your doctor with information about the size and shape of your heart and how well your hearts chambers and valves are working. This procedure takes approximately one hour. There are no restrictions for this procedure.    Follow-Up: At [facility], you and your health needs are our priority.  As part of our continuing mission to provide you with exceptional heart care, we have created designated Provider Care Teams.  These Care Teams include your primary Cardiologist (physician) and Advanced Practice Providers (APPs -  Physician Assistants and Nurse Practitioners) who all work together to provide you with the care you need, when you need it.  We recommend signing up for the patient portal called "MyChart".  Sign up information is provided on this After Visit Summary.  MyChart is used to connect with patients for Virtual Visits (Telemedicine).  Patients are able to view lab/test results, encounter notes, upcoming appointments, etc.  Non-urgent messages can be sent to your provider as well.   To learn more about what you can do with MyChart, go to [email or web address].     Your next appointment:   6 month(s)  The format for your next appointment:   In Person  Provider:   Jenne Campus, MD    Other Instructions

## 2021-09-04 ENCOUNTER — Other Ambulatory Visit: Payer: Self-pay

## 2021-09-04 ENCOUNTER — Ambulatory Visit (INDEPENDENT_AMBULATORY_CARE_PROVIDER_SITE_OTHER): Payer: Medicare Other

## 2021-09-04 DIAGNOSIS — Z6841 Body Mass Index (BMI) 40.0 and over, adult: Secondary | ICD-10-CM

## 2021-09-04 DIAGNOSIS — R0609 Other forms of dyspnea: Secondary | ICD-10-CM | POA: Diagnosis not present

## 2021-09-04 DIAGNOSIS — E785 Hyperlipidemia, unspecified: Secondary | ICD-10-CM

## 2021-09-04 DIAGNOSIS — I209 Angina pectoris, unspecified: Secondary | ICD-10-CM | POA: Diagnosis not present

## 2021-09-04 DIAGNOSIS — I10 Essential (primary) hypertension: Secondary | ICD-10-CM | POA: Diagnosis not present

## 2021-09-04 DIAGNOSIS — I25119 Atherosclerotic heart disease of native coronary artery with unspecified angina pectoris: Secondary | ICD-10-CM | POA: Diagnosis not present

## 2021-09-04 LAB — ECHOCARDIOGRAM COMPLETE
Area-P 1/2: 3.85 cm2
S' Lateral: 3 cm

## 2021-09-07 ENCOUNTER — Telehealth: Payer: Self-pay | Admitting: Cardiology

## 2021-09-07 NOTE — Telephone Encounter (Signed)
Patient came in for results of his stress test.  Please call [phone]  Thank you!

## 2021-09-07 NOTE — Telephone Encounter (Signed)
Called patient and informed him that Dr. Agustin Cree had not finished reviewing his stress test and had not given Joseph Mercado his interpretation. Once Dr. Agustin Cree releases the results to Joseph Mercado we would call him with the results.

## 2021-09-30 NOTE — Progress Notes (Signed)
Subjective:  Patient ID: Joseph Mercado, male    DOB: 05/10/1946  Age: 76 y.o. MRN: [id number]  Chief Complaint  Patient presents with   Hypertension   Hyperlipidemia    HPI: chronic visit  Hyperlipidemia: Current medications: Pravastatin 20 mg daily, Zetia 10 mg daily. Patient presents with hyperlipidemia.  Compliance with treatment has been good; patient takes medicines as directed, maintains low cholesterol diet, follows up as directed, and maintains exercise regimen.  Patient is using pravastatin without problems.   Hypertension: Complications: Current medications: Isosorbide 60 mg daily, Aspirin 81 mg every other day, Lisinopril 20 mg daily, Metoprolol 50 mg daily.  Diet: Exercise:    Gout: Allopurinol 100 mg daily. No gout flairs  GERD: Pantoprazole 40 mg daily. Doing well Current Outpatient Medications on File Prior to Visit  Medication Sig Dispense Refill   allopurinol (ZYLOPRIM) 100 MG tablet Take 100 mg by mouth 2 (two) times daily.     aspirin 81 MG EC tablet Take 1 tablet (81 mg total) by mouth every other day. Resume on 05/20/17. 30 tablet 12   ezetimibe (ZETIA) 10 MG tablet Take 10 mg by mouth daily.     isosorbide mononitrate (IMDUR) 60 MG 24 hr tablet Take 1 tablet (60 mg total) by mouth daily. 90 tablet 3   lisinopril (ZESTRIL) 20 MG tablet Take 20 mg by mouth daily.     metoprolol succinate (TOPROL-XL) 50 MG 24 hr tablet Take 50 mg by mouth daily. Take with or immediately following a meal.     Multiple Vitamins-Minerals (PRESERVISION AREDS PO) Take 1 capsule by mouth 2 (two) times daily. Unknown strenght     nitroGLYCERIN (NITROSTAT) 0.4 MG SL tablet Place 1 tablet (0.4 mg total) under the tongue every 5 (five) minutes as needed for chest pain. 25 tablet 6   pantoprazole (PROTONIX) 40 MG tablet Take 40 mg by mouth daily.     pravastatin (PRAVACHOL) 20 MG tablet Take 20 mg by mouth daily.     No current facility-administered medications on file prior to visit.    Past Medical History:  Diagnosis Date   Angina pectoris (HCC)    CAD (coronary artery disease)    a. 02/2017: NSTEMI with cath showing 100% Prox Cx stenosis with 95% Mid Cx stenosis and 45% mid-LAD stenosis. Unsuccessful attempt at crossing an occluded mid nondominant circumflex --> plan for staged PCI in coming weeks   Chest pain 02/2017   Dyspnea    GERD (gastroesophageal reflux disease)    High cholesterol    History of gout    Hypertension    Macular degeneration    NSTEMI (non-ST elevated myocardial infarction) (HCC) 02/2017   Pericardial effusion 04/2017   Past Surgical History:  Procedure Laterality Date   CATARACT EXTRACTION W/ INTRAOCULAR LENS  IMPLANT, BILATERAL Bilateral    CORONARY ANGIOPLASTY     CORONARY BALLOON ANGIOPLASTY N/A 03/10/2017   Procedure: Coronary Balloon Angioplasty;  Surgeon: Runell Gess, MD;  Location: [facility] INVASIVE CV LAB;  Service: Cardiovascular;  Laterality: N/A;  CFX   CORONARY CTO INTERVENTION N/A 05/14/2017   Procedure: CORONARY CTO INTERVENTION;  Surgeon: Swaziland, Peter M, MD;  Location: [facility] INVASIVE CV LAB;  Service: Cardiovascular;  Laterality: N/A;   LEFT HEART CATH AND CORONARY ANGIOGRAPHY N/A 03/10/2017   Procedure: Left Heart Cath and Coronary Angiography;  Surgeon: Runell Gess, MD;  Location: [facility] INVASIVE CV LAB;  Service: Cardiovascular;  Laterality: N/A;   PERICARDIOCENTESIS N/A 05/14/2017   Procedure: PERICARDIOCENTESIS;  Surgeon: Swaziland, Peter M, MD;  Location: [facility] INVASIVE CV LAB;  Service: Cardiovascular;  Laterality: N/A;   SUBXYPHOID PERICARDIAL WINDOW N/A 05/15/2017   Procedure: SUBXYPHOID PERICARDIAL WINDOW WITH DRAINAGE OF PERICARDIAL EFFUSION;  Surgeon: Alleen Borne, MD;  Location: [facility] OR;  Service: Thoracic;  Laterality: N/A;   ULTRASOUND GUIDANCE FOR VASCULAR ACCESS  05/14/2017   Procedure: Ultrasound Guidance For Vascular Access;  Surgeon: Swaziland, Peter M, MD;  Location: [facility] INVASIVE CV LAB;  Service: Cardiovascular;;     Family History  Problem Relation Age of Onset   CAD Brother        CABG in his 57's   Social History   Socioeconomic History   Marital status: Divorced    Spouse name: Not on file   Number of children: Not on file   Years of education: Not on file   Highest education level: Not on file  Occupational History   Not on file  Tobacco Use   Smoking status: Former    Packs/day: 3.00    Years: 20.00    Pack years: 60.00    Types: Cigarettes   Smokeless tobacco: Former    Types: Chew   Tobacco comments:    "quit smoking in the 1980s; chewed 4-5 years after I quit smoking"  Vaping Use   Vaping Use: Never used  Substance and Sexual Activity   Alcohol use: Yes    Alcohol/week: 70.0 standard drinks    Types: 70 Cans of beer per week    Comment: 05/14/2017 "6-10 beers/day"   Drug use: No   Sexual activity: Not on file  Other Topics Concern   Not on file  Social History Narrative   Not on file   Social Determinants of Health   Financial Resource Strain: Not on file  Food Insecurity: Not on file  Transportation Needs: Not on file  Physical Activity: Not on file  Stress: Not on file  Social Connections: Not on file    Review of Systems  Constitutional:  Negative for chills, diaphoresis, fatigue and fever.  HENT:  Positive for rhinorrhea. Negative for congestion, ear pain and sore throat.   Eyes:  Negative for visual disturbance.  Respiratory:  Negative for cough and shortness of breath.   Cardiovascular:  Negative for chest pain and leg swelling.  Gastrointestinal:  Negative for abdominal pain, constipation, diarrhea, nausea and vomiting.  Endocrine: Negative for polyuria.  Genitourinary:  Negative for dysuria and urgency.  Musculoskeletal:  Negative for arthralgias and myalgias.  Skin: Negative.   Neurological:  Negative for dizziness and headaches.  Psychiatric/Behavioral:  Negative for dysphoric mood.     Objective:  BP 118/78    Pulse 76    Temp (!) 96.5 F (35.8  C)    Ht 5\' 11"  (1.803 m)    Wt 245 lb (111.1 kg)    SpO2 99%    BMI 34.17 kg/m   BP/Weight 10/02/2021 08/28/2021 03/27/2021  Systolic BP 118 134 118  Diastolic BP 78 76 62  Wt. (Lbs) 245 245.4 240.8  BMI 34.17 34.23 45.5    Physical Exam Vitals reviewed.  Constitutional:      General: He is not in acute distress.    Appearance: Normal appearance.  HENT:     Head: Normocephalic and atraumatic.     Right Ear: Tympanic membrane normal.     Left Ear: Tympanic membrane normal.     Nose: Nose normal.     Mouth/Throat:     Mouth: Mucous  membranes are moist.  Eyes:     Extraocular Movements: Extraocular movements intact.     Conjunctiva/sclera: Conjunctivae normal.     Pupils: Pupils are equal, round, and reactive to light.  Neck:     Vascular: No carotid bruit.  Cardiovascular:     Rate and Rhythm: Normal rate and regular rhythm.     Pulses: Normal pulses.     Heart sounds: Normal heart sounds. No murmur heard.   No gallop.  Pulmonary:     Effort: Pulmonary effort is normal. No respiratory distress.     Breath sounds: No wheezing.  Abdominal:     General: Abdomen is flat. There is no distension.     Tenderness: There is no abdominal tenderness.  Musculoskeletal:        General: Normal range of motion.     Cervical back: Normal range of motion and neck supple.     Right lower leg: No edema.     Left lower leg: No edema.  Skin:    General: Skin is warm.     Capillary Refill: Capillary refill takes less than 2 seconds.  Neurological:     General: No focal deficit present.     Mental Status: He is alert and oriented to person, place, and time. Mental status is at baseline.  Psychiatric:        Thought Content: Thought content normal.        Lab Results  Component Value Date   WBC 10.1 03/27/2021   HGB 13.5 03/27/2021   HCT 42.9 03/27/2021   PLT 238 03/27/2021   GLUCOSE 95 03/27/2021   CHOL 142 03/27/2021   TRIG 103 03/27/2021   HDL 43 03/27/2021   LDLCALC 80  03/27/2021   ALT 14 03/27/2021   AST 16 03/27/2021   NA 133 (L) 03/27/2021   K 4.9 03/27/2021   CL 97 03/27/2021   CREATININE 1.14 03/27/2021   BUN 9 03/27/2021   CO2 24 03/27/2021   TSH 3.881 05/20/2017   INR 1.11 05/20/2017   HGBA1C 4.7 (L) 05/20/2017      Assessment & Plan:   Problem List Items Addressed This Visit       Cardiovascular and Mediastinum   Essential hypertension - Primary   Relevant Orders   Comprehensive metabolic panel   CBC with Differential/Platelet An individual hypertension care plan was established and reinforced today.  The patient's status was assessed using clinical findings on exam and labs or diagnostic tests. The patient's success at meeting treatment goals on disease specific evidence-based guidelines and found to be well controlled. SELF MANAGEMENT: The patient and I together assessed ways to personally work towards obtaining the recommended goals. RECOMMENDATIONS: avoid decongestants found in common cold remedies, decrease consumption of alcohol, perform routine monitoring of BP with home BP cuff, exercise, reduction of dietary salt, take medicines as prescribed, try not to miss doses and quit smoking.  Regular exercise and maintaining a healthy weight is needed.  Stress reduction may help. A CLINICAL SUMMARY including written plan identify barriers to care unique to individual due to social or financial issues.  We attempt to mutually creat solutions for individual and family understanding.     CAD (coronary artery disease) An individual plan was formulated based on patient history and exam, labs and evidence based data. Patient hasnot  had recent angina or nitroglycerin use. continue present treatment.     Senile purpura (HCC) Patient bruises easily     Respiratory   Obstructive  chronic bronchitis without exacerbation (HCC) An individualize plan was formulated for care of COPD.  Treatment is evidence based.  She will continue on inhalers,  avoid smoking and smoke.  Regular exercise with help with dyspnea. Routine follow ups and medication compliance is needed.      Digestive   GERD (gastroesophageal reflux disease) Plan of care was formulated today.  he is doing well.  A plan of care was formulated using patient exam, tests and other sources to optimize care using evidence based information.  Recommend no smoking, no eating after supper, avoid fatty foods, elevate Head of bed, avoid tight fitting clothing.  Continue on pantoprazole, I recommended tapering and use only if needed     Musculoskeletal and Integument   Chronic gouty arthritis   Relevant Orders   Uric acid No further gout since on allopurinol     Other   Hyperlipidemia LDL goal <70   Relevant Orders   Lipid panel   TSH AN INDIVIDUAL CARE PLAN for hyperlipidemia/ cholesterol was established and reinforced today.  The patient's status was assessed using clinical findings on exam, lab and other diagnostic tests. The patient's disease status was assessed based on evidence-based guidelines and found to be fair controlled. MEDICATIONS were reviewed. SELF MANAGEMENT GOALS have been discussed and patient's success at attaining the goal of low cholesterol was assessed. RECOMMENDATION given include regular exercise 3 days a week and low cholesterol/low fat diet. CLINICAL SUMMARY including written plan to identify barriers unique to the patient due to social or economic  reasons was discussed.    BMI 34.0-34.9,adult An individualize plan was formulated for obesity using patient history and physical exam to encourage weight loss.  An evidence based program was formulated.  Patient is to cut portion size with meals and to plan physical exercise 3 days a week at least 20 minutes.  Weight watchers and other programs are helpful.  Planned amount of weight loss 10 lbs.   .    Orders Placed This Encounter  Procedures   Comprehensive metabolic panel   CBC with  Differential/Platelet   Lipid panel   Uric acid   TSH     Follow-up: Return in about 6 months (around 04/01/2022) for fasting.  An After Visit Summary was printed and given to the patient.  Brent Bulla, MD [facility] [phone]

## 2021-10-02 ENCOUNTER — Encounter: Payer: Self-pay | Admitting: Legal Medicine

## 2021-10-02 ENCOUNTER — Other Ambulatory Visit: Payer: Self-pay

## 2021-10-02 ENCOUNTER — Ambulatory Visit (INDEPENDENT_AMBULATORY_CARE_PROVIDER_SITE_OTHER): Payer: Medicare Other | Admitting: Legal Medicine

## 2021-10-02 VITALS — BP 118/78 | HR 76 | Temp 96.5°F | Ht 71.0 in | Wt 245.0 lb

## 2021-10-02 DIAGNOSIS — Z6834 Body mass index (BMI) 34.0-34.9, adult: Secondary | ICD-10-CM

## 2021-10-02 DIAGNOSIS — I10 Essential (primary) hypertension: Secondary | ICD-10-CM | POA: Diagnosis not present

## 2021-10-02 DIAGNOSIS — J449 Chronic obstructive pulmonary disease, unspecified: Secondary | ICD-10-CM | POA: Diagnosis not present

## 2021-10-02 DIAGNOSIS — K219 Gastro-esophageal reflux disease without esophagitis: Secondary | ICD-10-CM

## 2021-10-02 DIAGNOSIS — Z6833 Body mass index (BMI) 33.0-33.9, adult: Secondary | ICD-10-CM | POA: Insufficient documentation

## 2021-10-02 DIAGNOSIS — E785 Hyperlipidemia, unspecified: Secondary | ICD-10-CM | POA: Diagnosis not present

## 2021-10-02 DIAGNOSIS — D692 Other nonthrombocytopenic purpura: Secondary | ICD-10-CM

## 2021-10-02 DIAGNOSIS — M1A00X0 Idiopathic chronic gout, unspecified site, without tophus (tophi): Secondary | ICD-10-CM

## 2021-10-02 DIAGNOSIS — I25119 Atherosclerotic heart disease of native coronary artery with unspecified angina pectoris: Secondary | ICD-10-CM | POA: Diagnosis not present

## 2021-10-03 LAB — CBC WITH DIFFERENTIAL/PLATELET
Basophils Absolute: 0.1 10*3/uL (ref 0.0–0.2)
Basos: 1 %
EOS (ABSOLUTE): 0.1 10*3/uL (ref 0.0–0.4)
Eos: 2 %
Hematocrit: 42.9 % (ref 37.5–51.0)
Hemoglobin: 13.6 g/dL (ref 13.0–17.7)
Immature Grans (Abs): 0 10*3/uL (ref 0.0–0.1)
Immature Granulocytes: 0 %
Lymphocytes Absolute: 1.6 10*3/uL (ref 0.7–3.1)
Lymphs: 17 %
MCH: 26.3 pg — ABNORMAL LOW (ref 26.6–33.0)
MCHC: 31.7 g/dL (ref 31.5–35.7)
MCV: 83 fL (ref 79–97)
Monocytes Absolute: 0.7 10*3/uL (ref 0.1–0.9)
Monocytes: 8 %
Neutrophils Absolute: 7 10*3/uL (ref 1.4–7.0)
Neutrophils: 72 %
Platelets: 265 10*3/uL (ref 150–450)
RBC: 5.18 x10E6/uL (ref 4.14–5.80)
RDW: 14.2 % (ref 11.6–15.4)
WBC: 9.5 10*3/uL (ref 3.4–10.8)

## 2021-10-03 LAB — LIPID PANEL
Chol/HDL Ratio: 3.5 ratio (ref 0.0–5.0)
Cholesterol, Total: 151 mg/dL (ref 100–199)
HDL: 43 mg/dL (ref >39)
LDL Chol Calc (NIH): 85 mg/dL (ref 0–99)
Triglycerides: 128 mg/dL (ref 0–149)
VLDL Cholesterol Cal: 23 mg/dL (ref 5–40)

## 2021-10-03 LAB — COMPREHENSIVE METABOLIC PANEL
ALT: 16 IU/L (ref 0–44)
AST: 23 IU/L (ref 0–40)
Albumin/Globulin Ratio: 1.5 (ref 1.2–2.2)
Albumin: 4.2 g/dL (ref 3.7–4.7)
Alkaline Phosphatase: 121 IU/L (ref 44–121)
BUN/Creatinine Ratio: 9 — ABNORMAL LOW (ref 10–24)
BUN: 10 mg/dL (ref 8–27)
Bilirubin Total: 0.7 mg/dL (ref 0.0–1.2)
CO2: 24 mmol/L (ref 20–29)
Calcium: 9.5 mg/dL (ref 8.6–10.2)
Chloride: 95 mmol/L — ABNORMAL LOW (ref 96–106)
Creatinine, Ser: 1.1 mg/dL (ref 0.76–1.27)
Globulin, Total: 2.8 g/dL (ref 1.5–4.5)
Glucose: 105 mg/dL — ABNORMAL HIGH (ref 70–99)
Potassium: 5.4 mmol/L — ABNORMAL HIGH (ref 3.5–5.2)
Sodium: 132 mmol/L — ABNORMAL LOW (ref 134–144)
Total Protein: 7 g/dL (ref 6.0–8.5)
eGFR: 70 mL/min/{1.73_m2} (ref >59)

## 2021-10-03 LAB — URIC ACID: Uric Acid: 4.3 mg/dL (ref 3.8–8.4)

## 2021-10-03 LAB — TSH: TSH: 2.16 u[IU]/mL (ref 0.450–4.500)

## 2021-10-03 LAB — CARDIOVASCULAR RISK ASSESSMENT

## 2021-10-03 NOTE — Progress Notes (Signed)
Glucose 105, sodium low 132 can make you weak, potassium high 5.4- recheck one week, liver tests normal, CBC normal, cholesterol normal, uric acid 4.3 good, TSH 2.16 normal lp

## 2021-10-05 ENCOUNTER — Other Ambulatory Visit: Payer: Self-pay | Admitting: Cardiology

## 2021-10-08 ENCOUNTER — Telehealth: Payer: Self-pay

## 2021-10-09 ENCOUNTER — Other Ambulatory Visit: Payer: Self-pay

## 2021-10-09 ENCOUNTER — Other Ambulatory Visit: Payer: Medicare Other

## 2021-10-09 DIAGNOSIS — I10 Essential (primary) hypertension: Secondary | ICD-10-CM

## 2021-10-09 NOTE — Telephone Encounter (Signed)
Sent call intake to [address]

## 2021-10-10 LAB — COMPREHENSIVE METABOLIC PANEL
ALT: 21 IU/L (ref 0–44)
AST: 29 IU/L (ref 0–40)
Albumin/Globulin Ratio: 1.6 (ref 1.2–2.2)
Albumin: 4.2 g/dL (ref 3.7–4.7)
Alkaline Phosphatase: 121 IU/L (ref 44–121)
BUN/Creatinine Ratio: 6 — ABNORMAL LOW (ref 10–24)
BUN: 6 mg/dL — ABNORMAL LOW (ref 8–27)
Bilirubin Total: 0.4 mg/dL (ref 0.0–1.2)
CO2: 26 mmol/L (ref 20–29)
Calcium: 9.5 mg/dL (ref 8.6–10.2)
Chloride: 96 mmol/L (ref 96–106)
Creatinine, Ser: 1.04 mg/dL (ref 0.76–1.27)
Globulin, Total: 2.6 g/dL (ref 1.5–4.5)
Glucose: 102 mg/dL — ABNORMAL HIGH (ref 70–99)
Potassium: 5.2 mmol/L (ref 3.5–5.2)
Sodium: 134 mmol/L (ref 134–144)
Total Protein: 6.8 g/dL (ref 6.0–8.5)
eGFR: 74 mL/min/{1.73_m2} (ref >59)

## 2021-10-10 NOTE — Progress Notes (Signed)
Potassium now normal 5.2 lp

## 2021-10-17 ENCOUNTER — Telehealth: Payer: Self-pay | Admitting: Cardiology

## 2021-10-17 NOTE — Telephone Encounter (Signed)
?*  STAT* If patient is at the pharmacy, call can be transferred to refill team. ? ? ?1. Which medications need to be refilled? (please list name of each medication and dose if known)  ?ezetimibe (ZETIA) 10 MG tablet ?isosorbide mononitrate (IMDUR) 60 MG 24 hr tablet ? ?2. Which pharmacy/location (including street and city if local pharmacy) is medication to be sent to? ?[place], [place] - [address] AT [address] ? ?3. Do they need a 30 day or 90 day supply? 90 with refills  ?

## 2021-10-19 ENCOUNTER — Other Ambulatory Visit: Payer: Self-pay

## 2021-10-19 MED ORDER — EZETIMIBE 10 MG PO TABS: 10.0000 mg | ORAL_TABLET | Freq: Every day | ORAL | 3 refills | Status: DC

## 2021-10-19 NOTE — Telephone Encounter (Signed)
PT states he needs a refill on his topical ointment, explained to him that we have not prescribed him topical ointment, Pt states he would prefer to be contacted about his prescription refill  ? ?Please advise Best number to reach pt  ?([phone]) ?

## 2021-10-19 NOTE — Telephone Encounter (Signed)
Called patient to get more information about the ointment that he needs refilled. Patient stated that he was not sure why the pharmacist told him he had an ointment that needed to be refilled. Looking at his medication list in his chart there is not an ointment listed. The patient said he would look into it and call us back if he needed a refill. I explained that we usually only refill cardiac medications and that he would have to contact his primary care physician for a refill on the ointment. I also refilled his zetia per Dr. Wendy Poet order. Patient was grateful for the help and had no further questions at this time. ?

## 2021-10-26 ENCOUNTER — Other Ambulatory Visit: Payer: Self-pay | Admitting: Legal Medicine

## 2021-11-08 DIAGNOSIS — C44529 Squamous cell carcinoma of skin of other part of trunk: Secondary | ICD-10-CM | POA: Diagnosis not present

## 2021-11-08 DIAGNOSIS — L578 Other skin changes due to chronic exposure to nonionizing radiation: Secondary | ICD-10-CM | POA: Diagnosis not present

## 2021-11-08 DIAGNOSIS — L821 Other seborrheic keratosis: Secondary | ICD-10-CM | POA: Diagnosis not present

## 2021-11-08 DIAGNOSIS — C44319 Basal cell carcinoma of skin of other parts of face: Secondary | ICD-10-CM | POA: Diagnosis not present

## 2021-11-29 DIAGNOSIS — C44529 Squamous cell carcinoma of skin of other part of trunk: Secondary | ICD-10-CM | POA: Diagnosis not present

## 2021-11-29 DIAGNOSIS — C44319 Basal cell carcinoma of skin of other parts of face: Secondary | ICD-10-CM | POA: Diagnosis not present

## 2021-12-04 ENCOUNTER — Other Ambulatory Visit: Payer: Self-pay | Admitting: Cardiology

## 2021-12-14 ENCOUNTER — Other Ambulatory Visit: Payer: Self-pay | Admitting: Family Medicine

## 2021-12-14 ENCOUNTER — Other Ambulatory Visit: Payer: Self-pay | Admitting: Cardiology

## 2022-03-19 DIAGNOSIS — L570 Actinic keratosis: Secondary | ICD-10-CM | POA: Diagnosis not present

## 2022-03-19 DIAGNOSIS — C44529 Squamous cell carcinoma of skin of other part of trunk: Secondary | ICD-10-CM | POA: Diagnosis not present

## 2022-03-19 DIAGNOSIS — L720 Epidermal cyst: Secondary | ICD-10-CM | POA: Diagnosis not present

## 2022-03-19 DIAGNOSIS — C44319 Basal cell carcinoma of skin of other parts of face: Secondary | ICD-10-CM | POA: Diagnosis not present

## 2022-04-02 NOTE — Progress Notes (Signed)
Subjective:  Patient ID: Joseph Mercado, male    DOB: 1945-10-21  Age: 76 y.o. MRN: [id number]  Chief Complaint  Patient presents with   Hypertension    HPI  Patient presents with hyperlipidemia.  Compliance with treatment has been good; patient takes medicines as directed, maintains low cholesterol diet, follows up as directed, and maintains exercise regimen.  Patient is using Pravastatin 20 mg daily, Zetia 10 mg daily without problems.   Patient presents for follow up of hypertension.  Patient Lisinopril 20 mg daily, Metoprolol 50 mg daily, Aspirin 81 mg daily tolerating  well without side effects. Patient is working on maintaining diet and exercise regimen and follows up as directed.   Copied From last Cath. CAD: Taking Isosorbide 60 mg daily, Aspirin 81 mg daily.EF 55-60% Mid LAD lesion, 45 %stenosed. Mid Cx lesion, 95 %stenosed. Prox Cx to Mid Cx lesion, 100 %stenosed. Post intervention, there is a 95% residual stenosis. The left ventricular systolic function is normal. LV end diastolic pressure is normal. The left ventricular ejection fraction is 55-65% by visual estimate.   Joseph Mercado is a 76 y.o. male  Patient presents with diagnosis of COPD.  It is not secondary to prolonged asthma.  Diagnosis tears  Treatment includes none.  The diagnosis has not been hospitalized for this diagnosis. Last na.  Patient is compliant with regular use of medicines.   Current Outpatient Medications on File Prior to Visit  Medication Sig Dispense Refill   allopurinol (ZYLOPRIM) 100 MG tablet TAKE 1 TABLET(100 MG) BY MOUTH TWICE DAILY 180 tablet 1   aspirin 81 MG EC tablet Take 1 tablet (81 mg total) by mouth every other day. Resume on 05/20/17. 30 tablet 12   ezetimibe (ZETIA) 10 MG tablet Take 1 tablet (10 mg total) by mouth daily. 90 tablet 3   lisinopril (ZESTRIL) 20 MG tablet TAKE 1 TABLET(20 MG) BY MOUTH DAILY 90 tablet 3   metoprolol succinate (TOPROL-XL) 50 MG 24 hr tablet Take 50 mg by  mouth daily. Take with or immediately following a meal.     Multiple Vitamins-Minerals (PRESERVISION AREDS PO) Take 1 capsule by mouth 2 (two) times daily. Unknown strenght     nitroGLYCERIN (NITROSTAT) 0.4 MG SL tablet Place 1 tablet (0.4 mg total) under the tongue every 5 (five) minutes as needed for chest pain. 25 tablet 6   pantoprazole (PROTONIX) 40 MG tablet Take 40 mg by mouth daily.     pravastatin (PRAVACHOL) 20 MG tablet TAKE 1 TABLET BY MOUTH DAILY 90 tablet 2   isosorbide mononitrate (IMDUR) 60 MG 24 hr tablet Take 1 tablet (60 mg total) by mouth daily. 90 tablet 3   No current facility-administered medications on file prior to visit.   Past Medical History:  Diagnosis Date   Angina pectoris (HCC)    CAD (coronary artery disease)    a. 02/2017: NSTEMI with cath showing 100% Prox Cx stenosis with 95% Mid Cx stenosis and 45% mid-LAD stenosis. Unsuccessful attempt at crossing an occluded mid nondominant circumflex --> plan for staged PCI in coming weeks   Chest pain 02/2017   Dyspnea    GERD (gastroesophageal reflux disease)    High cholesterol    History of gout    Hypertension    Macular degeneration    NSTEMI (non-ST elevated myocardial infarction) (HCC) 02/2017   Pericardial effusion 04/2017   Past Surgical History:  Procedure Laterality Date   CATARACT EXTRACTION W/ INTRAOCULAR LENS  IMPLANT, BILATERAL Bilateral  CORONARY ANGIOPLASTY     CORONARY BALLOON ANGIOPLASTY N/A 03/10/2017   Procedure: Coronary Balloon Angioplasty;  Surgeon: Runell Gess, MD;  Location: [facility] INVASIVE CV LAB;  Service: Cardiovascular;  Laterality: N/A;  CFX   CORONARY CTO INTERVENTION N/A 05/14/2017   Procedure: CORONARY CTO INTERVENTION;  Surgeon: Swaziland, Peter M, MD;  Location: [facility] INVASIVE CV LAB;  Service: Cardiovascular;  Laterality: N/A;   LEFT HEART CATH AND CORONARY ANGIOGRAPHY N/A 03/10/2017   Procedure: Left Heart Cath and Coronary Angiography;  Surgeon: Runell Gess, MD;   Location: [facility] INVASIVE CV LAB;  Service: Cardiovascular;  Laterality: N/A;   PERICARDIOCENTESIS N/A 05/14/2017   Procedure: PERICARDIOCENTESIS;  Surgeon: Swaziland, Peter M, MD;  Location: [facility] INVASIVE CV LAB;  Service: Cardiovascular;  Laterality: N/A;   SUBXYPHOID PERICARDIAL WINDOW N/A 05/15/2017   Procedure: SUBXYPHOID PERICARDIAL WINDOW WITH DRAINAGE OF PERICARDIAL EFFUSION;  Surgeon: Alleen Borne, MD;  Location: [facility] OR;  Service: Thoracic;  Laterality: N/A;   ULTRASOUND GUIDANCE FOR VASCULAR ACCESS  05/14/2017   Procedure: Ultrasound Guidance For Vascular Access;  Surgeon: Swaziland, Peter M, MD;  Location: [facility] INVASIVE CV LAB;  Service: Cardiovascular;;    Family History  Problem Relation Age of Onset   CAD Brother        CABG in his 18's   Social History   Socioeconomic History   Marital status: Divorced    Spouse name: Not on file   Number of children: Not on file   Years of education: Not on file   Highest education level: Not on file  Occupational History   Not on file  Tobacco Use   Smoking status: Former    Packs/day: 3.00    Years: 20.00    Total pack years: 60.00    Types: Cigarettes   Smokeless tobacco: Former    Types: Chew   Tobacco comments:    "quit smoking in the 1980s; chewed 4-5 years after I quit smoking"  Vaping Use   Vaping Use: Never used  Substance and Sexual Activity   Alcohol use: Yes    Alcohol/week: 70.0 standard drinks of alcohol    Types: 70 Cans of beer per week    Comment: 05/14/2017 "6-10 beers/day"   Drug use: No   Sexual activity: Not on file  Other Topics Concern   Not on file  Social History Narrative   Not on file   Social Determinants of Health   Financial Resource Strain: Not on file  Food Insecurity: Not on file  Transportation Needs: Not on file  Physical Activity: Not on file  Stress: Not on file  Social Connections: Not on file    Review of Systems  Constitutional:  Negative for appetite change, fatigue and fever.  HENT:   Negative for congestion, ear pain, sinus pressure and sore throat.   Eyes:  Negative for visual disturbance.  Respiratory:  Negative for cough, shortness of breath and wheezing.   Cardiovascular:  Negative for chest pain and palpitations.  Gastrointestinal:  Negative for abdominal pain, constipation, diarrhea, nausea and vomiting.  Genitourinary:  Negative for dysuria and frequency.  Musculoskeletal:  Positive for back pain. Negative for arthralgias, joint swelling and myalgias.  Skin:  Negative for rash.  Neurological:  Negative for dizziness, weakness and headaches.  Psychiatric/Behavioral:  Negative for dysphoric mood. The patient is not nervous/anxious.      Objective:  BP 124/60   Pulse 64   Resp 16   Ht 5\' 11"  (1.803 m)  Wt 241 lb (109.3 kg)   SpO2 96%   BMI 33.61 kg/m      04/03/2022    7:58 AM 10/02/2021    7:45 AM 08/28/2021   10:27 AM  BP/Weight  Systolic BP 124 118 134  Diastolic BP 60 78 76  Wt. (Lbs) 241 245 245.4  BMI 33.61 kg/m2 34.17 kg/m2 34.23 kg/m2    Physical Exam Vitals reviewed.  Constitutional:      General: He is not in acute distress.    Appearance: Normal appearance. He is obese.  HENT:     Head: Normocephalic and atraumatic.     Right Ear: Tympanic membrane normal.     Left Ear: Tympanic membrane normal.     Nose: Nose normal.     Mouth/Throat:     Mouth: Mucous membranes are moist.     Pharynx: Oropharynx is clear.  Eyes:     Conjunctiva/sclera: Conjunctivae normal.     Pupils: Pupils are equal, round, and reactive to light.  Cardiovascular:     Rate and Rhythm: Normal rate and regular rhythm.     Pulses: Normal pulses.     Heart sounds: Normal heart sounds. No murmur heard.    No gallop.  Pulmonary:     Effort: Pulmonary effort is normal. No respiratory distress.     Breath sounds: Normal breath sounds. No wheezing.  Abdominal:     General: Abdomen is flat. Bowel sounds are normal. There is no distension.     Palpations: Abdomen  is soft.     Tenderness: There is no abdominal tenderness.  Musculoskeletal:     Cervical back: Normal range of motion and neck supple.     Right lower leg: No edema.     Left lower leg: No edema.  Skin:    General: Skin is warm.     Capillary Refill: Capillary refill takes less than 2 seconds.  Neurological:     General: No focal deficit present.     Mental Status: He is alert and oriented to person, place, and time. Mental status is at baseline.     Motor: Weakness present.     Gait: Gait normal.         Lab Results  Component Value Date   WBC 9.5 10/02/2021   HGB 13.6 10/02/2021   HCT 42.9 10/02/2021   PLT 265 10/02/2021   GLUCOSE 102 (H) 10/09/2021   CHOL 151 10/02/2021   TRIG 128 10/02/2021   HDL 43 10/02/2021   LDLCALC 85 10/02/2021   ALT 21 10/09/2021   AST 29 10/09/2021   NA 134 10/09/2021   K 5.2 10/09/2021   CL 96 10/09/2021   CREATININE 1.04 10/09/2021   BUN 6 (L) 10/09/2021   CO2 26 10/09/2021   TSH 2.160 10/02/2021   INR 1.11 05/20/2017   HGBA1C 4.7 (L) 05/20/2017      Assessment & Plan:   Problem List Items Addressed This Visit       Cardiovascular and Mediastinum   Essential hypertension - Primary   Relevant Orders   Comprehensive metabolic panel   CBC with Differential/Platelet An individual hypertension care plan was established and reinforced today.  The patient's status was assessed using clinical findings on exam and labs or diagnostic tests. The patient's success at meeting treatment goals on disease specific evidence-based guidelines and found to be well controlled. SELF MANAGEMENT: The patient and I together assessed ways to personally work towards obtaining the recommended goals. RECOMMENDATIONS: avoid decongestants  found in common cold remedies, decrease consumption of alcohol, perform routine monitoring of BP with home BP cuff, exercise, reduction of dietary salt, take medicines as prescribed, try not to miss doses and quit smoking.   Regular exercise and maintaining a healthy weight is needed.  Stress reduction may help. A CLINICAL SUMMARY including written plan identify barriers to care unique to individual due to social or financial issues.  We attempt to mutually creat solutions for individual and family understanding.     CAD (coronary artery disease) An individual plan was formulated based on patient history and exam, labs and evidence based data. Patient has not had recent angina or nitroglycerin use. continue present treatment.     Senile purpura (HCC) Patient has easy bruising of skin     Respiratory   Obstructive chronic bronchitis without exacerbation (HCC) An individualize plan was formulated for care of COPD.  Treatment is evidence based.  She will continue on inhalers, avoid smoking and smoke.  Regular exercise with help with dyspnea. Routine follow ups and medication compliance is needed.      Digestive   GERD (gastroesophageal reflux disease) Plan of care was formulated today.  She is doing well.  A plan of care was formulated using patient exam, tests and other sources to optimize care using evidence based information.  Recommend no smoking, no eating after supper, avoid fatty foods, elevate Head of bed, avoid tight fitting clothing.  Continue on Pantoprazole PRN.      Musculoskeletal and Integument   Chronic gouty arthritis   Relevant Medications   ibuprofen (ADVIL) 600 MG tablet Patient stable on medicines     Other   Hyperlipidemia LDL goal <70   Relevant Orders   Lipid panel AN INDIVIDUAL CARE PLAN for hyperlipidemia/ cholesterol was established and reinforced today.  The patient's status was assessed using clinical findings on exam, lab and other diagnostic tests. The patient's disease status was assessed based on evidence-based guidelines and found to be fair controlled. MEDICATIONS were reviewed. SELF MANAGEMENT GOALS have been discussed and patient's success at attaining the goal of low  cholesterol was assessed. RECOMMENDATION given include regular exercise 3 days a week and low cholesterol/low fat diet. CLINICAL SUMMARY including written plan to identify barriers unique to the patient due to social or economic  reasons was discussed.     Impotence On no medicine due to nitrate    BMI 33.0-33.9,adult An individualize plan was formulated for obesity using patient history and physical exam to encourage weight loss.  An evidence based program was formulated.  Patient is to cut portion size with meals and to plan physical exercise 3 days a week at least 20 minutes.  Weight watchers and other programs are helpful.  Planned amount of weight loss 10 lbs.    Other Visit Diagnoses     Lumbar pain       Relevant Medications   ibuprofen (ADVIL) 600 MG tablet We discussed back exercises and PRN ibuprofen     .    Orders Placed This Encounter  Procedures   Comprehensive metabolic panel   Lipid panel   CBC with Differential/Platelet     Follow-up: Return in about 6 months (around 10/04/2022).  An After Visit Summary was printed and given to the patient.  Brent Bulla, MD [facility] [phone]

## 2022-04-03 ENCOUNTER — Encounter: Payer: Self-pay | Admitting: Legal Medicine

## 2022-04-03 ENCOUNTER — Ambulatory Visit (INDEPENDENT_AMBULATORY_CARE_PROVIDER_SITE_OTHER): Payer: Medicare Other | Admitting: Legal Medicine

## 2022-04-03 VITALS — BP 124/60 | HR 64 | Resp 16 | Ht 71.0 in | Wt 241.0 lb

## 2022-04-03 DIAGNOSIS — J449 Chronic obstructive pulmonary disease, unspecified: Secondary | ICD-10-CM | POA: Diagnosis not present

## 2022-04-03 DIAGNOSIS — Z6833 Body mass index (BMI) 33.0-33.9, adult: Secondary | ICD-10-CM

## 2022-04-03 DIAGNOSIS — M1A00X0 Idiopathic chronic gout, unspecified site, without tophus (tophi): Secondary | ICD-10-CM | POA: Diagnosis not present

## 2022-04-03 DIAGNOSIS — I25119 Atherosclerotic heart disease of native coronary artery with unspecified angina pectoris: Secondary | ICD-10-CM | POA: Diagnosis not present

## 2022-04-03 DIAGNOSIS — M545 Low back pain, unspecified: Secondary | ICD-10-CM | POA: Diagnosis not present

## 2022-04-03 DIAGNOSIS — I10 Essential (primary) hypertension: Secondary | ICD-10-CM

## 2022-04-03 DIAGNOSIS — J4489 Other specified chronic obstructive pulmonary disease: Secondary | ICD-10-CM

## 2022-04-03 DIAGNOSIS — E785 Hyperlipidemia, unspecified: Secondary | ICD-10-CM

## 2022-04-03 DIAGNOSIS — D692 Other nonthrombocytopenic purpura: Secondary | ICD-10-CM | POA: Diagnosis not present

## 2022-04-03 DIAGNOSIS — K219 Gastro-esophageal reflux disease without esophagitis: Secondary | ICD-10-CM

## 2022-04-03 DIAGNOSIS — N529 Male erectile dysfunction, unspecified: Secondary | ICD-10-CM

## 2022-04-03 DIAGNOSIS — M1A9XX0 Chronic gout, unspecified, without tophus (tophi): Secondary | ICD-10-CM

## 2022-04-03 MED ORDER — IBUPROFEN 600 MG PO TABS: 600.0000 mg | ORAL_TABLET | Freq: Three times a day (TID) | ORAL | 3 refills | Status: DC | PRN

## 2022-04-04 LAB — CBC WITH DIFFERENTIAL/PLATELET
Basophils Absolute: 0.1 10*3/uL (ref 0.0–0.2)
Basos: 1 %
EOS (ABSOLUTE): 0.2 10*3/uL (ref 0.0–0.4)
Eos: 2 %
Hematocrit: 41.4 % (ref 37.5–51.0)
Hemoglobin: 12.6 g/dL — ABNORMAL LOW (ref 13.0–17.7)
Immature Grans (Abs): 0 10*3/uL (ref 0.0–0.1)
Immature Granulocytes: 0 %
Lymphocytes Absolute: 2.3 10*3/uL (ref 0.7–3.1)
Lymphs: 24 %
MCH: 25.1 pg — ABNORMAL LOW (ref 26.6–33.0)
MCHC: 30.4 g/dL — ABNORMAL LOW (ref 31.5–35.7)
MCV: 83 fL (ref 79–97)
Monocytes Absolute: 0.8 10*3/uL (ref 0.1–0.9)
Monocytes: 8 %
Neutrophils Absolute: 6.4 10*3/uL (ref 1.4–7.0)
Neutrophils: 65 %
Platelets: 266 10*3/uL (ref 150–450)
RBC: 5.01 x10E6/uL (ref 4.14–5.80)
RDW: 14.9 % (ref 11.6–15.4)
WBC: 9.8 10*3/uL (ref 3.4–10.8)

## 2022-04-04 LAB — COMPREHENSIVE METABOLIC PANEL
ALT: 16 IU/L (ref 0–44)
AST: 23 IU/L (ref 0–40)
Albumin/Globulin Ratio: 1.5 (ref 1.2–2.2)
Albumin: 4.1 g/dL (ref 3.8–4.8)
Alkaline Phosphatase: 104 IU/L (ref 44–121)
BUN/Creatinine Ratio: 9 — ABNORMAL LOW (ref 10–24)
BUN: 9 mg/dL (ref 8–27)
Bilirubin Total: 0.7 mg/dL (ref 0.0–1.2)
CO2: 22 mmol/L (ref 20–29)
Calcium: 9.3 mg/dL (ref 8.6–10.2)
Chloride: 95 mmol/L — ABNORMAL LOW (ref 96–106)
Creatinine, Ser: 1.05 mg/dL (ref 0.76–1.27)
Globulin, Total: 2.7 g/dL (ref 1.5–4.5)
Glucose: 103 mg/dL — ABNORMAL HIGH (ref 70–99)
Potassium: 5.3 mmol/L — ABNORMAL HIGH (ref 3.5–5.2)
Sodium: 131 mmol/L — ABNORMAL LOW (ref 134–144)
Total Protein: 6.8 g/dL (ref 6.0–8.5)
eGFR: 74 mL/min/{1.73_m2} (ref >59)

## 2022-04-04 LAB — LIPID PANEL
Chol/HDL Ratio: 3 ratio (ref 0.0–5.0)
Cholesterol, Total: 145 mg/dL (ref 100–199)
HDL: 48 mg/dL (ref >39)
LDL Chol Calc (NIH): 77 mg/dL (ref 0–99)
Triglycerides: 112 mg/dL (ref 0–149)
VLDL Cholesterol Cal: 20 mg/dL (ref 5–40)

## 2022-04-04 LAB — CARDIOVASCULAR RISK ASSESSMENT

## 2022-04-04 NOTE — Progress Notes (Signed)
Glucose 103, sodium low and potassium high, hold lisinopril for 2 weeks , no added potassium, recheck BMP, may have to change BP medicine, liver tests normal, CBC stable, Cholesterol normal,  lp

## 2022-04-08 ENCOUNTER — Other Ambulatory Visit: Payer: Self-pay

## 2022-04-08 DIAGNOSIS — E875 Hyperkalemia: Secondary | ICD-10-CM

## 2022-04-12 ENCOUNTER — Other Ambulatory Visit: Payer: Self-pay | Admitting: Legal Medicine

## 2022-04-23 ENCOUNTER — Other Ambulatory Visit: Payer: Medicare Other

## 2022-04-23 DIAGNOSIS — E875 Hyperkalemia: Secondary | ICD-10-CM

## 2022-04-23 LAB — COMPREHENSIVE METABOLIC PANEL
ALT: 18 IU/L (ref 0–44)
AST: 22 IU/L (ref 0–40)
Albumin/Globulin Ratio: 1.7 (ref 1.2–2.2)
Albumin: 4 g/dL (ref 3.8–4.8)
Alkaline Phosphatase: 109 IU/L (ref 44–121)
BUN/Creatinine Ratio: 7 — ABNORMAL LOW (ref 10–24)
BUN: 7 mg/dL — ABNORMAL LOW (ref 8–27)
Bilirubin Total: 0.6 mg/dL (ref 0.0–1.2)
CO2: 25 mmol/L (ref 20–29)
Calcium: 9.2 mg/dL (ref 8.6–10.2)
Chloride: 96 mmol/L (ref 96–106)
Creatinine, Ser: 1.02 mg/dL (ref 0.76–1.27)
Globulin, Total: 2.4 g/dL (ref 1.5–4.5)
Glucose: 94 mg/dL (ref 70–99)
Potassium: 4.6 mmol/L (ref 3.5–5.2)
Sodium: 131 mmol/L — ABNORMAL LOW (ref 134–144)
Total Protein: 6.4 g/dL (ref 6.0–8.5)
eGFR: 76 mL/min/{1.73_m2} (ref >59)

## 2022-04-24 NOTE — Progress Notes (Signed)
Kidney tests normal, sodium low 131, Liver tests normal, potassium

## 2022-04-26 ENCOUNTER — Ambulatory Visit (INDEPENDENT_AMBULATORY_CARE_PROVIDER_SITE_OTHER): Payer: Medicare Other | Admitting: Nurse Practitioner

## 2022-04-26 ENCOUNTER — Telehealth: Payer: Self-pay

## 2022-04-26 ENCOUNTER — Encounter: Payer: Self-pay | Admitting: Nurse Practitioner

## 2022-04-26 VITALS — BP 130/80 | HR 81 | Temp 96.9°F | Ht 71.0 in | Wt 238.0 lb

## 2022-04-26 DIAGNOSIS — R1032 Left lower quadrant pain: Secondary | ICD-10-CM | POA: Diagnosis not present

## 2022-04-26 DIAGNOSIS — G8929 Other chronic pain: Secondary | ICD-10-CM | POA: Diagnosis not present

## 2022-04-26 DIAGNOSIS — R1031 Right lower quadrant pain: Secondary | ICD-10-CM | POA: Diagnosis not present

## 2022-04-26 DIAGNOSIS — M5136 Other intervertebral disc degeneration, lumbar region: Secondary | ICD-10-CM

## 2022-04-26 DIAGNOSIS — M545 Low back pain, unspecified: Secondary | ICD-10-CM | POA: Diagnosis not present

## 2022-04-26 MED ORDER — KETOROLAC TROMETHAMINE 60 MG/2ML IM SOLN
60.0000 mg | Freq: Once | INTRAMUSCULAR | Status: AC
  Administered 2022-04-26: 60 mg via INTRAMUSCULAR

## 2022-04-26 MED ORDER — CYCLOBENZAPRINE HCL 10 MG PO TABS: 10.0000 mg | ORAL_TABLET | Freq: Three times a day (TID) | ORAL | 0 refills | Status: DC | PRN

## 2022-04-26 MED ORDER — TRIAMCINOLONE ACETONIDE 40 MG/ML IJ SUSP
60.0000 mg | Freq: Once | INTRAMUSCULAR | Status: AC
  Administered 2022-04-26: 60 mg via INTRAMUSCULAR

## 2022-04-26 MED ORDER — IBUPROFEN 800 MG PO TABS: 800.0000 mg | ORAL_TABLET | Freq: Three times a day (TID) | ORAL | 0 refills | Status: DC | PRN

## 2022-04-26 NOTE — Progress Notes (Signed)
Acute Office Visit  Subjective:    Patient ID: Joseph Mercado, male    DOB: 12/12/1945, 76 y.o.   MRN: [id number]  Chief Complaint  Patient presents with  . Groin Pain    HPI: Patient is in today for groin pain x 2-3 years states he has discussed this before with a physician who did order a back xray which was normal. States "it is not bone pain". Pain starts in his groin and radiates to his lower back, bending, twisting  and going to standing position causes pain (sharp), sitting (dull pain), laying flat at times is comfortable. Pain if aggravated by sweeping, mopping, and raking the yard. Previous lumbar x-ray from 03/2021 revealed DDD, L5-S1, pt denies seeing a spine specialist in the past. States previously prescribed Prednisone pack caused left ear hearing loss. Pt unable to void for UA. Denies fever, chills, numbness/tingling in lower extremities. Denies changes in bowel or bladder continence.   Past Medical History:  Diagnosis Date  . Angina pectoris ([place])   . CAD (coronary artery disease)    a. 02/2017: NSTEMI with cath showing 100% Prox Cx stenosis with 95% Mid Cx stenosis and 45% mid-LAD stenosis. Unsuccessful attempt at crossing an occluded mid nondominant circumflex --> plan for staged PCI in coming weeks  . Chest pain 02/2017  . Dyspnea   . GERD (gastroesophageal reflux disease)   . High cholesterol   . History of gout   . Hypertension   . Macular degeneration   . NSTEMI (non-ST elevated myocardial infarction) ([place]) 02/2017  . Pericardial effusion 04/2017    Past Surgical History:  Procedure Laterality Date  . CATARACT EXTRACTION W/ INTRAOCULAR LENS  IMPLANT, BILATERAL Bilateral   . CORONARY ANGIOPLASTY    . CORONARY BALLOON ANGIOPLASTY N/A 03/10/2017   Procedure: Coronary Balloon Angioplasty;  Surgeon: Lorretta Harp, MD;  Location: [facility];  Service: Cardiovascular;  Laterality: N/A;  CFX  . CORONARY CTO INTERVENTION N/A 05/14/2017   Procedure: CORONARY  CTO INTERVENTION;  Surgeon: Martinique, Peter M, MD;  Location: [facility];  Service: Cardiovascular;  Laterality: N/A;  . LEFT HEART CATH AND CORONARY ANGIOGRAPHY N/A 03/10/2017   Procedure: Left Heart Cath and Coronary Angiography;  Surgeon: Lorretta Harp, MD;  Location: [facility];  Service: Cardiovascular;  Laterality: N/A;  . PERICARDIOCENTESIS N/A 05/14/2017   Procedure: PERICARDIOCENTESIS;  Surgeon: Martinique, Peter M, MD;  Location: [facility];  Service: Cardiovascular;  Laterality: N/A;  . SUBXYPHOID PERICARDIAL WINDOW N/A 05/15/2017   Procedure: SUBXYPHOID PERICARDIAL WINDOW WITH DRAINAGE OF PERICARDIAL EFFUSION;  Surgeon: Gaye Pollack, MD;  Location: [facility];  Service: Thoracic;  Laterality: N/A;  . ULTRASOUND GUIDANCE FOR VASCULAR ACCESS  05/14/2017   Procedure: Ultrasound Guidance For Vascular Access;  Surgeon: Martinique, Peter M, MD;  Location: [facility];  Service: Cardiovascular;;    Family History  Problem Relation Age of Onset  . CAD Brother        CABG in his 72's    Social History   Socioeconomic History  . Marital status: Divorced    Spouse name: Not on file  . Number of children: Not on file  . Years of education: Not on file  . Highest education level: Not on file  Occupational History  . Not on file  Tobacco Use  . Smoking status: Former    Packs/day: 3.00    Years: 20.00    Total pack years: 60.00  Acute Office Visit  Subjective:    Patient ID: Joseph Mercado, male    DOB: 12/12/1945, 76 y.o.   MRN: [id number]  Chief Complaint  Patient presents with  . Groin Pain    HPI: Patient is in today for groin pain x 2-3 years states he has discussed this before with a physician who did order a back xray which was normal. States "it is not bone pain". Pain starts in his groin and radiates to his lower back, bending, twisting  and going to standing position causes pain (sharp), sitting (dull pain), laying flat at times is comfortable. Pain if aggravated by sweeping, mopping, and raking the yard. Previous lumbar x-ray from 03/2021 revealed DDD, L5-S1, pt denies seeing a spine specialist in the past. States previously prescribed Prednisone pack caused left ear hearing loss. Pt unable to void for UA. Denies fever, chills, numbness/tingling in lower extremities. Denies changes in bowel or bladder continence.   Past Medical History:  Diagnosis Date  . Angina pectoris ([place])   . CAD (coronary artery disease)    a. 02/2017: NSTEMI with cath showing 100% Prox Cx stenosis with 95% Mid Cx stenosis and 45% mid-LAD stenosis. Unsuccessful attempt at crossing an occluded mid nondominant circumflex --> plan for staged PCI in coming weeks  . Chest pain 02/2017  . Dyspnea   . GERD (gastroesophageal reflux disease)   . High cholesterol   . History of gout   . Hypertension   . Macular degeneration   . NSTEMI (non-ST elevated myocardial infarction) ([place]) 02/2017  . Pericardial effusion 04/2017    Past Surgical History:  Procedure Laterality Date  . CATARACT EXTRACTION W/ INTRAOCULAR LENS  IMPLANT, BILATERAL Bilateral   . CORONARY ANGIOPLASTY    . CORONARY BALLOON ANGIOPLASTY N/A 03/10/2017   Procedure: Coronary Balloon Angioplasty;  Surgeon: Lorretta Harp, MD;  Location: [facility];  Service: Cardiovascular;  Laterality: N/A;  CFX  . CORONARY CTO INTERVENTION N/A 05/14/2017   Procedure: CORONARY  CTO INTERVENTION;  Surgeon: Martinique, Peter M, MD;  Location: [facility];  Service: Cardiovascular;  Laterality: N/A;  . LEFT HEART CATH AND CORONARY ANGIOGRAPHY N/A 03/10/2017   Procedure: Left Heart Cath and Coronary Angiography;  Surgeon: Lorretta Harp, MD;  Location: [facility];  Service: Cardiovascular;  Laterality: N/A;  . PERICARDIOCENTESIS N/A 05/14/2017   Procedure: PERICARDIOCENTESIS;  Surgeon: Martinique, Peter M, MD;  Location: [facility];  Service: Cardiovascular;  Laterality: N/A;  . SUBXYPHOID PERICARDIAL WINDOW N/A 05/15/2017   Procedure: SUBXYPHOID PERICARDIAL WINDOW WITH DRAINAGE OF PERICARDIAL EFFUSION;  Surgeon: Gaye Pollack, MD;  Location: [facility];  Service: Thoracic;  Laterality: N/A;  . ULTRASOUND GUIDANCE FOR VASCULAR ACCESS  05/14/2017   Procedure: Ultrasound Guidance For Vascular Access;  Surgeon: Martinique, Peter M, MD;  Location: [facility];  Service: Cardiovascular;;    Family History  Problem Relation Age of Onset  . CAD Brother        CABG in his 72's    Social History   Socioeconomic History  . Marital status: Divorced    Spouse name: Not on file  . Number of children: Not on file  . Years of education: Not on file  . Highest education level: Not on file  Occupational History  . Not on file  Tobacco Use  . Smoking status: Former    Packs/day: 3.00    Years: 20.00    Total pack years: 60.00  test.  Lymphadenopathy:     Lower Body: No right inguinal adenopathy. No left inguinal adenopathy.  Skin:    General: Skin is warm and dry.     Capillary Refill: Capillary refill takes less than 2 seconds.  Neurological:     General: No focal deficit present.     Mental Status: He is alert and oriented to person, place, and time.  Psychiatric:        Mood and Affect: Mood normal.        Behavior: Behavior normal.    BP 130/80   Pulse 81   Temp (!) 96.9 F (36.1 C)   Ht 5\' 11"  (1.803 m)   Wt 238 lb (108 kg)   SpO2 99%   BMI 33.19 kg/m   Wt Readings from Last 3 Encounters:  04/26/22 238 lb (108 kg)  04/03/22 241 lb (109.3 kg)  10/02/21 245 lb (111.1 kg)    Health Maintenance Due  Topic Date Due  . TETANUS/TDAP  Never done  . Zoster Vaccines- Shingrix (1 of 2) Never done  . Pneumonia Vaccine 34+ Years old (1 - PCV) Never done  . COVID-19 Vaccine (5 - Moderna series) 10/01/2021  . INFLUENZA VACCINE  03/19/2022       Lab Results  Component Value Date   TSH 2.160 10/02/2021   Lab Results  Component Value Date   WBC 9.8 04/03/2022   HGB 12.6 (L) 04/03/2022   HCT 41.4 04/03/2022   MCV 83 04/03/2022   PLT 266 04/03/2022   Lab Results  Component Value Date   NA 131 (L) 04/23/2022   K 4.6 04/23/2022   CO2 25 04/23/2022   GLUCOSE 94 04/23/2022   BUN 7 (L) 04/23/2022   CREATININE 1.02 04/23/2022    BILITOT 0.6 04/23/2022   ALKPHOS 109 04/23/2022   AST 22 04/23/2022   ALT 18 04/23/2022   PROT 6.4 04/23/2022   ALBUMIN 4.0 04/23/2022   CALCIUM 9.2 04/23/2022   ANIONGAP 8 05/21/2017   EGFR 76 04/23/2022   Lab Results  Component Value Date   CHOL 145 04/03/2022   Lab Results  Component Value Date   HDL 48 04/03/2022   Lab Results  Component Value Date   LDLCALC 77 04/03/2022   Lab Results  Component Value Date   TRIG 112 04/03/2022   Lab Results  Component Value Date   CHOLHDL 3.0 04/03/2022   Lab Results  Component Value Date   HGBA1C 4.7 (L) 05/20/2017       Assessment & Plan:    1. DDD (degenerative disc disease), lumbar - ketorolac (TORADOL) injection 60 mg - triamcinolone acetonide (KENALOG-40) injection 60 mg - cyclobenzaprine (FLEXERIL) 10 MG tablet; Take 1 tablet (10 mg total) by mouth 3 (three) times daily as needed for muscle spasms.  Dispense: 30 tablet; Refill: 0 - Ambulatory referral to Spine Surgery  2. Chronic midline low back pain without sciatica - ketorolac (TORADOL) injection 60 mg - triamcinolone acetonide (KENALOG-40) injection 60 mg - cyclobenzaprine (FLEXERIL) 10 MG tablet; Take 1 tablet (10 mg total) by mouth 3 (three) times daily as needed for muscle spasms.  Dispense: 30 tablet; Refill: 0 - Ambulatory referral to Spine Surgery  3. Bilateral groin pain - ketorolac (TORADOL) injection 60 mg - triamcinolone acetonide (KENALOG-40) injection 60 mg - cyclobenzaprine (FLEXERIL) 10 MG tablet; Take 1 tablet (10 mg total) by mouth 3 (three) times daily as needed for muscle spasms.  Dispense: 30 tablet; Refill: 0 - Ambulatory referral  Acute Office Visit  Subjective:    Patient ID: Joseph Mercado, male    DOB: 12/12/1945, 76 y.o.   MRN: [id number]  Chief Complaint  Patient presents with  . Groin Pain    HPI: Patient is in today for groin pain x 2-3 years states he has discussed this before with a physician who did order a back xray which was normal. States "it is not bone pain". Pain starts in his groin and radiates to his lower back, bending, twisting  and going to standing position causes pain (sharp), sitting (dull pain), laying flat at times is comfortable. Pain if aggravated by sweeping, mopping, and raking the yard. Previous lumbar x-ray from 03/2021 revealed DDD, L5-S1, pt denies seeing a spine specialist in the past. States previously prescribed Prednisone pack caused left ear hearing loss. Pt unable to void for UA. Denies fever, chills, numbness/tingling in lower extremities. Denies changes in bowel or bladder continence.   Past Medical History:  Diagnosis Date  . Angina pectoris ([place])   . CAD (coronary artery disease)    a. 02/2017: NSTEMI with cath showing 100% Prox Cx stenosis with 95% Mid Cx stenosis and 45% mid-LAD stenosis. Unsuccessful attempt at crossing an occluded mid nondominant circumflex --> plan for staged PCI in coming weeks  . Chest pain 02/2017  . Dyspnea   . GERD (gastroesophageal reflux disease)   . High cholesterol   . History of gout   . Hypertension   . Macular degeneration   . NSTEMI (non-ST elevated myocardial infarction) ([place]) 02/2017  . Pericardial effusion 04/2017    Past Surgical History:  Procedure Laterality Date  . CATARACT EXTRACTION W/ INTRAOCULAR LENS  IMPLANT, BILATERAL Bilateral   . CORONARY ANGIOPLASTY    . CORONARY BALLOON ANGIOPLASTY N/A 03/10/2017   Procedure: Coronary Balloon Angioplasty;  Surgeon: Lorretta Harp, MD;  Location: [facility];  Service: Cardiovascular;  Laterality: N/A;  CFX  . CORONARY CTO INTERVENTION N/A 05/14/2017   Procedure: CORONARY  CTO INTERVENTION;  Surgeon: Martinique, Peter M, MD;  Location: [facility];  Service: Cardiovascular;  Laterality: N/A;  . LEFT HEART CATH AND CORONARY ANGIOGRAPHY N/A 03/10/2017   Procedure: Left Heart Cath and Coronary Angiography;  Surgeon: Lorretta Harp, MD;  Location: [facility];  Service: Cardiovascular;  Laterality: N/A;  . PERICARDIOCENTESIS N/A 05/14/2017   Procedure: PERICARDIOCENTESIS;  Surgeon: Martinique, Peter M, MD;  Location: [facility];  Service: Cardiovascular;  Laterality: N/A;  . SUBXYPHOID PERICARDIAL WINDOW N/A 05/15/2017   Procedure: SUBXYPHOID PERICARDIAL WINDOW WITH DRAINAGE OF PERICARDIAL EFFUSION;  Surgeon: Gaye Pollack, MD;  Location: [facility];  Service: Thoracic;  Laterality: N/A;  . ULTRASOUND GUIDANCE FOR VASCULAR ACCESS  05/14/2017   Procedure: Ultrasound Guidance For Vascular Access;  Surgeon: Martinique, Peter M, MD;  Location: [facility];  Service: Cardiovascular;;    Family History  Problem Relation Age of Onset  . CAD Brother        CABG in his 72's    Social History   Socioeconomic History  . Marital status: Divorced    Spouse name: Not on file  . Number of children: Not on file  . Years of education: Not on file  . Highest education level: Not on file  Occupational History  . Not on file  Tobacco Use  . Smoking status: Former    Packs/day: 3.00    Years: 20.00    Total pack years: 60.00

## 2022-04-26 NOTE — Patient Instructions (Addendum)
Take Ibuprofen every 8 hours as needed for severe back pain Take Flexeril every 8 hours as needed for back muscle spasms Alternate heat/ice to lower back as needed Perform back exercises daily We will call you with referral to spine specialist    Back Exercises These exercises help to make your trunk and back strong. They also help to keep the lower back flexible. Doing these exercises can help to prevent or lessen pain in your lower back. If you have back pain, try to do these exercises 2-3 times each day or as told by your doctor. As you get better, do the exercises once each day. Repeat the exercises more often as told by your doctor. To stop back pain from coming back, do the exercises once each day, or as told by your doctor. Do exercises exactly as told by your doctor. Stop right away if you feel sudden pain or your pain gets worse. Exercises Single knee to chest Do these steps 3-5 times in a row for each leg: Lie on your back on a firm bed or the floor with your legs stretched out. Bring one knee to your chest. Grab your knee or thigh with both hands and hold it in place. Pull on your knee until you feel a gentle stretch in your lower back or butt. Keep doing the stretch for 10-30 seconds. Slowly let go of your leg and straighten it. Pelvic tilt Do these steps 5-10 times in a row: Lie on your back on a firm bed or the floor with your legs stretched out. Bend your knees so they point up to the ceiling. Your feet should be flat on the floor. Tighten your lower belly (abdomen) muscles to press your lower back against the floor. This will make your tailbone point up to the ceiling instead of pointing down to your feet or the floor. Stay in this position for 5-10 seconds while you gently tighten your muscles and breathe evenly. Cat-cow Do these steps until your lower back bends more easily: Get on your hands and knees on a firm bed or the floor. Keep your hands under your shoulders,  and keep your knees under your hips. You may put padding under your knees. Let your head hang down toward your chest. Tighten (contract) the muscles in your belly. Point your tailbone toward the floor so your lower back becomes rounded like the back of a cat. Stay in this position for 5 seconds. Slowly lift your head. Let the muscles of your belly relax. Point your tailbone up toward the ceiling so your back forms a sagging arch like the back of a cow. Stay in this position for 5 seconds.  Press-ups Do these steps 5-10 times in a row: Lie on your belly (face-down) on a firm bed or the floor. Place your hands near your head, about shoulder-width apart. While you keep your back relaxed and keep your hips on the floor, slowly straighten your arms to raise the top half of your body and lift your shoulders. Do not use your back muscles. You may change where you place your hands to make yourself more comfortable. Stay in this position for 5 seconds. Keep your back relaxed. Slowly return to lying flat on the floor.  Bridges Do these steps 10 times in a row: Lie on your back on a firm bed or the floor. Bend your knees so they point up to the ceiling. Your feet should be flat on the floor. Your arms should  be flat at your sides, next to your body. Tighten your butt muscles and lift your butt off the floor until your waist is almost as high as your knees. If you do not feel the muscles working in your butt and the back of your thighs, slide your feet 1-2 inches (2.5-5 cm) farther away from your butt. Stay in this position for 3-5 seconds. Slowly lower your butt to the floor, and let your butt muscles relax. If this exercise is too easy, try doing it with your arms crossed over your chest. Belly crunches Do these steps 5-10 times in a row: Lie on your back on a firm bed or the floor with your legs stretched out. Bend your knees so they point up to the ceiling. Your feet should be flat on the  floor. Cross your arms over your chest. Tip your chin a little bit toward your chest, but do not bend your neck. Tighten your belly muscles and slowly raise your chest just enough to lift your shoulder blades a tiny bit off the floor. Avoid raising your body higher than that because it can put too much stress on your lower back. Slowly lower your chest and your head to the floor. Back lifts Do these steps 5-10 times in a row: Lie on your belly (face-down) with your arms at your sides, and rest your forehead on the floor. Tighten the muscles in your legs and your butt. Slowly lift your chest off the floor while you keep your hips on the floor. Keep the back of your head in line with the curve in your back. Look at the floor while you do this. Stay in this position for 3-5 seconds. Slowly lower your chest and your face to the floor. Contact a doctor if: Your back pain gets a lot worse when you do an exercise. Your back pain does not get better within 2 hours after you exercise. If you have any of these problems, stop doing the exercises. Do not do them again unless your doctor says it is okay. Get help right away if: You have sudden, very bad back pain. If this happens, stop doing the exercises. Do not do them again unless your doctor says it is okay. This information is not intended to replace advice given to you by your health care provider. Make sure you discuss any questions you have with your health care provider. Document Revised: 10/18/2020 Document Reviewed: 10/18/2020 Elsevier Patient Education  [place].    Chronic Back Pain When back pain lasts longer than 3 months, it is called chronic back pain. The cause of your back pain may not be known. Some common causes include: Wear and tear (degenerative disease) of the bones, ligaments, or disks in your back. Inflammation and stiffness in your back (arthritis). People who have chronic back pain often go through certain  periods in which the pain is more intense (flare-ups). Many people can learn to manage the pain with home care. Follow these instructions at home: Pay attention to any changes in your symptoms. Take these actions to help with your pain: Managing pain and stiffness     If directed, apply ice to the painful area. Your health care provider may recommend applying ice during the first 24-48 hours after a flare-up begins. To do this: Put ice in a plastic bag. Place a towel between your skin and the bag. Leave the ice on for 20 minutes, 2-3 times per day. If directed, apply heat  to the affected area as often as told by your health care provider. Use the heat source that your health care provider recommends, such as a moist heat pack or a heating pad. Place a towel between your skin and the heat source. Leave the heat on for 20-30 minutes. Remove the heat if your skin turns bright red. This is especially important if you are unable to feel pain, heat, or cold. You may have a greater risk of getting burned. Try soaking in a warm tub. Activity  Avoid bending and other activities that make the problem worse. Maintain a proper position when standing or sitting: When standing, keep your upper back and neck straight, with your shoulders pulled back. Avoid slouching. When sitting, keep your back straight and relax your shoulders. Do not round your shoulders or pull them backward. Do not sit or stand in one place for long periods of time. Take brief periods of rest throughout the day. This will reduce your pain. Resting in a lying or standing position is usually better than sitting to rest. When you are resting for longer periods, mix in some mild activity or stretching between periods of rest. This will help to prevent stiffness and pain. Get regular exercise. Ask your health care provider what activities are safe for you. Do not lift anything that is heavier than 10 lb (4.5 kg), or the limit that you are  told, until your health care provider says that it is safe. Always use proper lifting technique, which includes: Bending your knees. Keeping the load close to your body. Avoiding twisting. Sleep on a firm mattress in a comfortable position. Try lying on your side with your knees slightly bent. If you lie on your back, put a pillow under your knees. Medicines Treatment may include medicines for pain and inflammation taken by mouth or applied to the skin, prescription pain medicine, or muscle relaxants. Take over-the-counter and prescription medicines only as told by your health care provider. Ask your health care provider if the medicine prescribed to you: Requires you to avoid driving or using machinery. Can cause constipation. You may need to take these actions to prevent or treat constipation: Drink enough fluid to keep your urine pale yellow. Take over-the-counter or prescription medicines. Eat foods that are high in fiber, such as beans, whole grains, and fresh fruits and vegetables. Limit foods that are high in fat and processed sugars, such as fried or sweet foods. General instructions Do not use any products that contain nicotine or tobacco, such as cigarettes, e-cigarettes, and chewing tobacco. If you need help quitting, ask your health care provider. Keep all follow-up visits as told by your health care provider. This is important. Contact a health care provider if: You have pain that is not relieved with rest or medicine. Your pain gets worse, or you have new pain. You have a high fever. You have rapid weight loss. You have trouble doing your normal activities. Get help right away if: You have weakness or numbness in one or both of your legs or feet. You have trouble controlling your bladder or your bowels. You have severe back pain and have any of the following: Nausea or vomiting. Pain in your abdomen. Shortness of breath or you faint. Summary Chronic back pain is back pain  that lasts longer than 3 months. When a flare-up begins, apply ice to the painful area for the first 24-48 hours. Apply a moist heat pad or use a heating pad on the painful  area as directed by your health care provider. When you are resting for longer periods, mix in some mild activity or stretching between periods of rest. This will help to prevent stiffness and pain. This information is not intended to replace advice given to you by your health care provider. Make sure you discuss any questions you have with your health care provider. Document Revised: 09/15/2019 Document Reviewed: 09/15/2019 Elsevier Patient Education  [place].

## 2022-04-26 NOTE — Telephone Encounter (Signed)
Called patient unable to leave [facility]. Needs NV for BMP due to hyperkalemia. Need these results before patient can be told to restart Lisinopril per Dr. Henrene Pastor.

## 2022-04-26 NOTE — Telephone Encounter (Signed)
Patient does not need NV for BMP this was my oversight. Can he restart his lisinopril?

## 2022-04-29 ENCOUNTER — Telehealth: Payer: Self-pay

## 2022-04-29 NOTE — Telephone Encounter (Signed)
Called patient to schedule AWV- spoke to patient- he is uninterested in doing a visit by phone and would like it done in the office by a "doctor".  I have relayed the message to the team.  Gentry Fitz, RN [facility] 04/29/2022 2:48 PM

## 2022-04-30 NOTE — Telephone Encounter (Signed)
Restart lisinopril 

## 2022-05-03 DIAGNOSIS — M549 Dorsalgia, unspecified: Secondary | ICD-10-CM | POA: Diagnosis not present

## 2022-05-07 DIAGNOSIS — E7800 Pure hypercholesterolemia, unspecified: Secondary | ICD-10-CM | POA: Diagnosis not present

## 2022-05-07 DIAGNOSIS — A419 Sepsis, unspecified organism: Secondary | ICD-10-CM | POA: Diagnosis not present

## 2022-05-07 DIAGNOSIS — R109 Unspecified abdominal pain: Secondary | ICD-10-CM | POA: Diagnosis not present

## 2022-05-07 DIAGNOSIS — U071 COVID-19: Secondary | ICD-10-CM | POA: Diagnosis not present

## 2022-05-07 DIAGNOSIS — Z8673 Personal history of transient ischemic attack (TIA), and cerebral infarction without residual deficits: Secondary | ICD-10-CM | POA: Diagnosis not present

## 2022-05-07 DIAGNOSIS — I700 Atherosclerosis of aorta: Secondary | ICD-10-CM | POA: Diagnosis not present

## 2022-05-07 DIAGNOSIS — E871 Hypo-osmolality and hyponatremia: Secondary | ICD-10-CM | POA: Diagnosis not present

## 2022-05-07 DIAGNOSIS — K219 Gastro-esophageal reflux disease without esophagitis: Secondary | ICD-10-CM | POA: Diagnosis not present

## 2022-05-07 DIAGNOSIS — I6521 Occlusion and stenosis of right carotid artery: Secondary | ICD-10-CM | POA: Diagnosis not present

## 2022-05-07 DIAGNOSIS — Z79899 Other long term (current) drug therapy: Secondary | ICD-10-CM | POA: Diagnosis not present

## 2022-05-07 DIAGNOSIS — I10 Essential (primary) hypertension: Secondary | ICD-10-CM | POA: Diagnosis not present

## 2022-05-07 DIAGNOSIS — M109 Gout, unspecified: Secondary | ICD-10-CM | POA: Diagnosis not present

## 2022-05-07 DIAGNOSIS — I6389 Other cerebral infarction: Secondary | ICD-10-CM | POA: Diagnosis not present

## 2022-05-07 DIAGNOSIS — R319 Hematuria, unspecified: Secondary | ICD-10-CM | POA: Diagnosis not present

## 2022-05-07 DIAGNOSIS — I491 Atrial premature depolarization: Secondary | ICD-10-CM | POA: Diagnosis not present

## 2022-05-07 DIAGNOSIS — G9341 Metabolic encephalopathy: Secondary | ICD-10-CM | POA: Diagnosis not present

## 2022-05-07 DIAGNOSIS — Z87891 Personal history of nicotine dependence: Secondary | ICD-10-CM | POA: Diagnosis not present

## 2022-05-07 DIAGNOSIS — E875 Hyperkalemia: Secondary | ICD-10-CM | POA: Diagnosis not present

## 2022-05-07 DIAGNOSIS — I6523 Occlusion and stenosis of bilateral carotid arteries: Secondary | ICD-10-CM | POA: Diagnosis not present

## 2022-05-07 DIAGNOSIS — G9340 Encephalopathy, unspecified: Secondary | ICD-10-CM | POA: Diagnosis not present

## 2022-05-07 DIAGNOSIS — I6503 Occlusion and stenosis of bilateral vertebral arteries: Secondary | ICD-10-CM | POA: Diagnosis not present

## 2022-05-07 DIAGNOSIS — Z888 Allergy status to other drugs, medicaments and biological substances status: Secondary | ICD-10-CM | POA: Diagnosis not present

## 2022-05-07 DIAGNOSIS — I2510 Atherosclerotic heart disease of native coronary artery without angina pectoris: Secondary | ICD-10-CM | POA: Diagnosis not present

## 2022-05-07 DIAGNOSIS — I252 Old myocardial infarction: Secondary | ICD-10-CM | POA: Diagnosis not present

## 2022-05-07 DIAGNOSIS — G319 Degenerative disease of nervous system, unspecified: Secondary | ICD-10-CM | POA: Diagnosis not present

## 2022-05-07 DIAGNOSIS — I639 Cerebral infarction, unspecified: Secondary | ICD-10-CM | POA: Diagnosis not present

## 2022-05-08 ENCOUNTER — Other Ambulatory Visit: Payer: Self-pay

## 2022-05-09 DIAGNOSIS — I6389 Other cerebral infarction: Secondary | ICD-10-CM

## 2022-05-13 ENCOUNTER — Telehealth: Payer: Self-pay | Admitting: *Deleted

## 2022-05-13 ENCOUNTER — Encounter: Payer: Self-pay | Admitting: *Deleted

## 2022-05-13 NOTE — Patient Outreach (Signed)
  Care Coordination TOC Note Transition Care Management Follow-up Telephone Call Date of discharge and from where: 9/23/23[facility], [place]; COVID and "light stroke" per patient report How have you been since you were released from the hospital? "I am doing very good since I was released from the hospital; they admitted me with COVID and told me that I also had a light stroke; I have completely recovered and my family and friends are looking after me and are here for me if I need anything; I was told to stay on quarantine for 5 days once I got home, so I am staying away from people and doing what they told me to.  My son has completely gone through all of my medications to make sure I am taking them exactly the way I am supposed to.  I am going to call as soon as we hang up to get an appointment with my PCP next week- that is what they recommended at the hospital" Any questions or concerns? No  Items Reviewed: Did the pt receive and understand the discharge instructions provided? Yes  Medications obtained and verified? Yes  Other? No  Any new allergies since your discharge? No  Dietary orders reviewed? Yes Do you have support at home? Yes  son, brother, and friends are assisting as needed/ indicated  Home Care and Equipment/Supplies: Were home health services ordered? no If so, what is the name of the agency? N/A  Has the agency set up a time to come to the patient's home? not applicable Were any new equipment or medical supplies ordered?  No What is the name of the medical supply agency? N/A Were you able to get the supplies/equipment? not applicable Do you have any questions related to the use of the equipment or supplies? No N/A  Functional Questionnaire: (I = Independent and D = Dependent) ADLs: I  Bathing/Dressing- I  Meal Prep- I  son, brother, and friends are assisting as needed/ indicated  Eating- I  Maintaining continence- I  Transferring/Ambulation- I  Managing Meds- I   son, brother, and friends are assisting as needed/ indicated  Follow up appointments reviewed:  PCP Hospital f/u appt confirmed? No  Scheduled to see - on - @ - facilitated scheduling prompt PCP appointment: deferred- prior to note signing, verified patient self-scheduled for Wednesday, 05/22/22 at 11:00 am after completion of our [facility] call [facility] f/u appt confirmed? No  Scheduled to see - on - @ - Are transportation arrangements needed? No  If their condition worsens, is the pt aware to call PCP or go to the Emergency Dept.? Yes Was the patient provided with contact information for the PCP's office or ED? Yes Was to pt encouraged to call back with questions or concerns? Yes  SDOH assessments and interventions completed:   Yes  Care Coordination Interventions Activated:  Yes   Care Coordination Interventions:  Discussed timing of vaccinations for flu/ COVID/ pneumonia vaccines, given post- COVID hospitalization; confirmed patient understands quarantine instructions, encouraged patient to schedule specialist appointments as instructed    Encounter Outcome:  Pt. Visit Completed    Oneta Rack, RN, BSN, CCRN Alumnus RN CM Care Coordination/ Transition of [place] Management [phone]: direct office

## 2022-05-22 ENCOUNTER — Ambulatory Visit (INDEPENDENT_AMBULATORY_CARE_PROVIDER_SITE_OTHER): Payer: Medicare Other | Admitting: Family Medicine

## 2022-05-22 VITALS — BP 120/64 | HR 104 | Temp 97.0°F | Resp 16 | Ht 71.0 in | Wt 227.0 lb

## 2022-05-22 DIAGNOSIS — J069 Acute upper respiratory infection, unspecified: Secondary | ICD-10-CM | POA: Diagnosis not present

## 2022-05-22 DIAGNOSIS — I10 Essential (primary) hypertension: Secondary | ICD-10-CM

## 2022-05-22 DIAGNOSIS — I69319 Unspecified symptoms and signs involving cognitive functions following cerebral infarction: Secondary | ICD-10-CM | POA: Diagnosis not present

## 2022-05-22 DIAGNOSIS — Z23 Encounter for immunization: Secondary | ICD-10-CM

## 2022-05-22 DIAGNOSIS — I6521 Occlusion and stenosis of right carotid artery: Secondary | ICD-10-CM

## 2022-05-22 DIAGNOSIS — G9340 Encephalopathy, unspecified: Secondary | ICD-10-CM

## 2022-05-22 DIAGNOSIS — U071 COVID-19: Secondary | ICD-10-CM | POA: Diagnosis not present

## 2022-05-22 MED ORDER — ASPIRIN 81 MG PO TBEC: 81.0000 mg | DELAYED_RELEASE_TABLET | Freq: Every day | ORAL | 12 refills | Status: AC

## 2022-05-22 NOTE — Progress Notes (Signed)
Subjective:  Patient ID: Joseph Mercado, male    DOB: 06-Jan-1946  Age: 76 y.o. MRN: [id number]  Chief Complaint  Patient presents with   Hospitalization Follow-up    HPI Joseph Mercado comes in for hospital follow-up.  He was admitted to [facility] on 05/07/2022 and discharged on 05/11/2022.  He presented to the ED with altered mental status.  Admitting diagnosis hyponatremia, hyperkalemia, acute encephalopathy, cva, carotid stenosis, and acute covid.    CT of brain was done.  Stable atrophy.  No acute findings. 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  No nephrolithiasis. No colonic diverticulosis. Aortic atherosclerosis. CXR: negative.  MRI Brain: small acute infarct in left frontal white matter. Started on aspirin 81 mg qd.   Neurology consult: EEG negative. Echo normal.  CTA NECK/BRAIN: 70% stenosis of right ICA. Left ICA no signficant stenosis.  Needs vascular surgery follow up .  Continued home statin and zetia.  Hyperkalemia: resolved with lokelma.  A1C 5.8.  LDL 70   Current Outpatient Medications on File Prior to Visit  Medication Sig Dispense Refill   allopurinol (ZYLOPRIM) 100 MG tablet TAKE 1 TABLET(100 MG) BY MOUTH TWICE DAILY 180 tablet 1   amLODipine (NORVASC) 5 MG tablet Take 5 mg by mouth daily.     ezetimibe (ZETIA) 10 MG tablet Take 1 tablet (10 mg total) by mouth daily. 90 tablet 3   ibuprofen (ADVIL) 600 MG tablet Take 1 tablet (600 mg total) by mouth every 8 (eight) hours as needed. 90 tablet 3   isosorbide mononitrate (IMDUR) 60 MG 24 hr tablet Take 1 tablet (60 mg total) by mouth daily. 90 tablet 3   lisinopril (ZESTRIL) 20 MG tablet TAKE 1 TABLET(20 MG) BY MOUTH DAILY 90 tablet 3   Multiple Vitamins-Minerals (PRESERVISION AREDS PO) Take 1 capsule by mouth 2 (two) times daily. Unknown strenght     nitroGLYCERIN (NITROSTAT) 0.4 MG SL tablet Place 1 tablet (0.4 mg total) under the tongue  every 5 (five) minutes as needed for chest pain. 25 tablet 6   pantoprazole (PROTONIX) 40 MG tablet TAKE 1 TABLET(40 MG) BY MOUTH DAILY 90 tablet 1   pravastatin (PRAVACHOL) 20 MG tablet TAKE 1 TABLET BY MOUTH DAILY 90 tablet 2   No current facility-administered medications on file prior to visit.   Past Medical History:  Diagnosis Date   Angina pectoris (HCC)    CAD (coronary artery disease)    a. 02/2017: NSTEMI with cath showing 100% Prox Cx stenosis with 95% Mid Cx stenosis and 45% mid-LAD stenosis. Unsuccessful attempt at crossing an occluded mid nondominant circumflex --> plan for staged PCI in coming weeks   Chest pain 02/2017   Dyspnea    GERD (gastroesophageal reflux disease)    High cholesterol    History of gout    Hypertension    Macular degeneration    NSTEMI (non-ST elevated myocardial infarction) (HCC) 02/2017   Pericardial effusion 04/2017   Past Surgical History:  Procedure Laterality Date   CATARACT EXTRACTION W/ INTRAOCULAR LENS  IMPLANT, BILATERAL Bilateral    CORONARY ANGIOPLASTY     CORONARY BALLOON ANGIOPLASTY N/A 03/10/2017   Procedure: Coronary Balloon Angioplasty;  Surgeon: Runell Gess, MD;  Location: [facility] INVASIVE CV LAB;  Service: Cardiovascular;  Laterality: N/A;  CFX   CORONARY CTO INTERVENTION N/A 05/14/2017   Procedure: CORONARY CTO INTERVENTION;  Surgeon: Swaziland, Peter M, MD;  Location: [facility] INVASIVE CV LAB;  Service: Cardiovascular;  Laterality: N/A;   LEFT HEART CATH AND CORONARY ANGIOGRAPHY N/A 03/10/2017   Procedure: Left Heart Cath and Coronary Angiography;  Surgeon: Runell Gess, MD;  Location: [facility] INVASIVE CV LAB;  Service: Cardiovascular;  Laterality: N/A;   PERICARDIOCENTESIS N/A 05/14/2017   Procedure: PERICARDIOCENTESIS;  Surgeon: Swaziland, Peter M, MD;  Location[facility] INVASIVE CV LAB;  Service: Cardiovascular;  Laterality: N/A;   SUBXYPHOID PERICARDIAL WINDOW N/A 05/15/2017   Procedure: SUBXYPHOID PERICARDIAL WINDOW WITH DRAINAGE OF  PERICARDIAL EFFUSION;  Surgeon: Alleen Borne, MD;  Location: [facility] OR;  Service: Thoracic;  Laterality: N/A;   ULTRASOUND GUIDANCE FOR VASCULAR ACCESS  05/14/2017   Procedure: Ultrasound Guidance For Vascular Access;  Surgeon: Swaziland, Peter M, MD;  Location[facility] INVASIVE CV LAB;  Service: Cardiovascular;;    Family History  Problem Relation Age of Onset   CAD Brother        CABG in his 56's   Social History   Socioeconomic History   Marital status: Divorced    Spouse name: Not on file   Number of children: Not on file   Years of education: Not on file   Highest education level: Not on file  Occupational History   Not on file  Tobacco Use   Smoking status: Former    Packs/day: 3.00    Years: 20.00    Total pack years: 60.00    Types: Cigarettes   Smokeless tobacco: Former    Types: Chew   Tobacco comments:    "quit smoking in the 1980s; chewed 4-5 years after I quit smoking"  Vaping Use   Vaping Use: Never used  Substance and Sexual Activity   Alcohol use: Yes    Alcohol/week: 70.0 standard drinks of alcohol    Types: 70 Cans of beer per week    Comment: 05/14/2017 "6-10 beers/day"   Drug use: No   Sexual activity: Not on file  Other Topics Concern   Not on file  Social History Narrative   Not on file   Social Determinants of Health   Financial Resource Strain: Not on file  Food Insecurity: No Food Insecurity (05/13/2022)   Hunger Vital Sign    Worried About Running Out of Food in the Last Year: Never true    Ran Out of Food in the Last Year: Never true  Transportation Needs: No Transportation Needs (05/13/2022)   PRAPARE - Administrator, Civil Service (Medical): No    Lack of Transportation (Non-Medical): No  Physical Activity: Not on file  Stress: Not on file  Social Connections: Not on file    Review of Systems  Constitutional:  Negative for chills and fever.  HENT:  Negative for congestion, rhinorrhea and sore throat.   Respiratory:  Negative  for cough and shortness of breath.   Cardiovascular:  Negative for chest pain and palpitations.  Gastrointestinal:  Negative for abdominal pain, constipation, diarrhea, nausea and vomiting.  Genitourinary:  Negative for dysuria and urgency.  Musculoskeletal:  Positive for arthralgias and back pain. Negative for myalgias.  Neurological:  Negative for dizziness and headaches.  Psychiatric/Behavioral:  Negative for dysphoric mood. The patient is not nervous/anxious.      Objective:  BP 120/64   Pulse (!) 104   Temp (!) 97 F (36.1 C)   Resp 16   Ht 5\' 11"  (1.803 m)   Wt 227 lb (103 kg)   BMI 31.66 kg/m      05/22/2022   11:14  AM 04/26/2022    9:30 AM 04/03/2022    7:58 AM  BP/Weight  Systolic BP 120 130 124  Diastolic BP 64 80 60  Wt. (Lbs) 227 238 241  BMI 31.66 kg/m2 33.19 kg/m2 33.61 kg/m2    Physical Exam Vitals reviewed.  Constitutional:      Appearance: Normal appearance.  Neck:     Vascular: No carotid bruit.  Cardiovascular:     Rate and Rhythm: Normal rate and regular rhythm.     Pulses: Normal pulses.     Heart sounds: Normal heart sounds.  Pulmonary:     Effort: Pulmonary effort is normal.     Breath sounds: Normal breath sounds. No wheezing, rhonchi or rales.  Abdominal:     General: Bowel sounds are normal.     Palpations: Abdomen is soft.     Tenderness: There is no abdominal tenderness.  Neurological:     Mental Status: He is alert and oriented to person, place, and time.  Psychiatric:        Mood and Affect: Mood normal.        Behavior: Behavior normal.     Diabetic Foot Exam - Simple   No data filed      Lab Results  Component Value Date   WBC 13.6 (H) 05/22/2022   HGB 13.6 05/22/2022   HCT 43.2 05/22/2022   PLT 302 05/22/2022   GLUCOSE 93 05/22/2022   CHOL 145 04/03/2022   TRIG 112 04/03/2022   HDL 48 04/03/2022   LDLCALC 77 04/03/2022   ALT 23 05/22/2022   AST 24 05/22/2022   NA 131 (L) 05/22/2022   K 5.2 05/22/2022   CL 94  (L) 05/22/2022   CREATININE 1.05 05/22/2022   BUN 15 05/22/2022   CO2 22 05/22/2022   TSH 2.160 10/02/2021   INR 1.11 05/20/2017   HGBA1C 4.7 (L) 05/20/2017      Assessment & Plan:   Problem List Items Addressed This Visit       Cardiovascular and Mediastinum   Essential hypertension    Well controlled.  No changes to medicines. Continue amlodipine, imdur, and lisinopril. Continue to work on eating a healthy diet and exercise.  Labs drawn today.        Relevant Medications   amLODipine (NORVASC) 5 MG tablet   aspirin EC 81 MG tablet   Other Relevant Orders   CBC with Differential/Platelet (Completed)   Comprehensive metabolic panel (Completed)   Right-sided extracranial carotid artery stenosis    Refer to vascular surgery.      Relevant Medications   amLODipine (NORVASC) 5 MG tablet   aspirin EC 81 MG tablet   Other Relevant Orders   Ambulatory referral to Vascular Surgery     Respiratory   Upper respiratory tract infection due to COVID-19 virus    Resolved.        Nervous and Auditory   Encephalopathy acute    Resolved.      Cognitive deficit following cerebrovascular accident (CVA)    Improved.  Refer to vascular surgery.         Other   Need for influenza vaccination - Primary   Relevant Orders   Flu Vaccine QUAD High Dose(Fluad) (Completed)  .  Meds ordered this encounter  Medications   aspirin EC 81 MG tablet    Sig: Take 1 tablet (81 mg total) by mouth daily. Resume on 05/20/17.    Dispense:  30 tablet    Refill:  12  Orders Placed This Encounter  Procedures   Flu Vaccine QUAD High Dose(Fluad)   CBC with Differential/Platelet   Comprehensive metabolic panel   Ambulatory referral to Vascular Surgery     Follow-up: No follow-ups on file.  An After Visit Summary was printed and given to the patient.  Blane Ohara, MD [facility] [phone]

## 2022-05-23 LAB — CBC WITH DIFFERENTIAL/PLATELET
Basophils Absolute: 0 10*3/uL (ref 0.0–0.2)
Basos: 0 %
EOS (ABSOLUTE): 0.1 10*3/uL (ref 0.0–0.4)
Eos: 1 %
Hematocrit: 43.2 % (ref 37.5–51.0)
Hemoglobin: 13.6 g/dL (ref 13.0–17.7)
Immature Grans (Abs): 0.1 10*3/uL (ref 0.0–0.1)
Immature Granulocytes: 1 %
Lymphocytes Absolute: 1.9 10*3/uL (ref 0.7–3.1)
Lymphs: 14 %
MCH: 25.7 pg — ABNORMAL LOW (ref 26.6–33.0)
MCHC: 31.5 g/dL (ref 31.5–35.7)
MCV: 82 fL (ref 79–97)
Monocytes Absolute: 0.9 10*3/uL (ref 0.1–0.9)
Monocytes: 6 %
Neutrophils Absolute: 10.6 10*3/uL — ABNORMAL HIGH (ref 1.4–7.0)
Neutrophils: 78 %
Platelets: 302 10*3/uL (ref 150–450)
RBC: 5.29 x10E6/uL (ref 4.14–5.80)
RDW: 16.5 % — ABNORMAL HIGH (ref 11.6–15.4)
WBC: 13.6 10*3/uL — ABNORMAL HIGH (ref 3.4–10.8)

## 2022-05-23 LAB — COMPREHENSIVE METABOLIC PANEL
ALT: 23 IU/L (ref 0–44)
AST: 24 IU/L (ref 0–40)
Albumin/Globulin Ratio: 1.5 (ref 1.2–2.2)
Albumin: 4.3 g/dL (ref 3.8–4.8)
Alkaline Phosphatase: 100 IU/L (ref 44–121)
BUN/Creatinine Ratio: 14 (ref 10–24)
BUN: 15 mg/dL (ref 8–27)
Bilirubin Total: 0.6 mg/dL (ref 0.0–1.2)
CO2: 22 mmol/L (ref 20–29)
Calcium: 9.8 mg/dL (ref 8.6–10.2)
Chloride: 94 mmol/L — ABNORMAL LOW (ref 96–106)
Creatinine, Ser: 1.05 mg/dL (ref 0.76–1.27)
Globulin, Total: 2.8 g/dL (ref 1.5–4.5)
Glucose: 93 mg/dL (ref 70–99)
Potassium: 5.2 mmol/L (ref 3.5–5.2)
Sodium: 131 mmol/L — ABNORMAL LOW (ref 134–144)
Total Protein: 7.1 g/dL (ref 6.0–8.5)
eGFR: 74 mL/min/{1.73_m2} (ref >59)

## 2022-05-30 ENCOUNTER — Encounter: Payer: Self-pay | Admitting: Family Medicine

## 2022-05-30 DIAGNOSIS — I69319 Unspecified symptoms and signs involving cognitive functions following cerebral infarction: Secondary | ICD-10-CM | POA: Insufficient documentation

## 2022-05-30 DIAGNOSIS — G9340 Encephalopathy, unspecified: Secondary | ICD-10-CM

## 2022-05-30 DIAGNOSIS — Z23 Encounter for immunization: Secondary | ICD-10-CM | POA: Insufficient documentation

## 2022-05-30 DIAGNOSIS — I6521 Occlusion and stenosis of right carotid artery: Secondary | ICD-10-CM

## 2022-05-30 DIAGNOSIS — U071 COVID-19: Secondary | ICD-10-CM | POA: Insufficient documentation

## 2022-05-30 DIAGNOSIS — J069 Acute upper respiratory infection, unspecified: Secondary | ICD-10-CM | POA: Insufficient documentation

## 2022-05-30 HISTORY — DX: Occlusion and stenosis of right carotid artery: I65.21

## 2022-05-30 HISTORY — DX: Unspecified symptoms and signs involving cognitive functions following cerebral infarction: I69.319

## 2022-05-30 HISTORY — DX: Encephalopathy, unspecified: G93.40

## 2022-05-30 NOTE — Assessment & Plan Note (Signed)
Refer to vascular surgery. 

## 2022-05-30 NOTE — Assessment & Plan Note (Signed)
Improved.  Refer to vascular surgery.

## 2022-05-30 NOTE — Assessment & Plan Note (Signed)
Resolved

## 2022-05-30 NOTE — Assessment & Plan Note (Signed)
Well controlled.  No changes to medicines. Continue amlodipine, imdur, and lisinopril. Continue to work on eating a healthy diet and exercise.  Labs drawn today.   

## 2022-06-02 ENCOUNTER — Other Ambulatory Visit: Payer: Self-pay | Admitting: Cardiology

## 2022-06-03 ENCOUNTER — Other Ambulatory Visit (HOSPITAL_COMMUNITY): Payer: Self-pay | Admitting: Vascular Surgery

## 2022-06-03 DIAGNOSIS — I6523 Occlusion and stenosis of bilateral carotid arteries: Secondary | ICD-10-CM

## 2022-06-03 NOTE — Progress Notes (Signed)
Office Note     CC: Bilateral carotid artery stenosis with recent stroke Requesting Provider:  Blane Ohara, MD  HPI: Joseph Mercado is a 76 y.o. (Nov 04, 1945) male presenting at the request of .Cox, Kirsten, MD for bilateral carotid artery stenosis with recent stroke.   In today, Joseph Mercado was doing well, accompanied by a friend.  Native of [facility] [place] [facility], he currently lives in [place].  In September, Joseph Mercado presented to the ED with altered mental status.  Work-up demonstrated left-sided frontal infarct.  He was treated and discharged.  On exam today, Joseph Mercado was doing well.  He notes significant improvement in mentation, continues to have some difficulty with recall, however this is improving.  He denies recent TIA, stroke, amaurosis since his initial event September  The pt is  on a statin for cholesterol management.  The pt is  on a daily aspirin.   Other AC:  -   Past Medical History:  Diagnosis Date   Angina pectoris (HCC)    CAD (coronary artery disease)    a. 02/2017: NSTEMI with cath showing 100% Prox Cx stenosis with 95% Mid Cx stenosis and 45% mid-LAD stenosis. Unsuccessful attempt at crossing an occluded mid nondominant circumflex --> plan for staged PCI in coming weeks   Chest pain 02/2017   Dyspnea    GERD (gastroesophageal reflux disease)    High cholesterol    History of gout    Hypertension    Macular degeneration    NSTEMI (non-ST elevated myocardial infarction) (HCC) 02/2017   Pericardial effusion 04/2017    Past Surgical History:  Procedure Laterality Date   CATARACT EXTRACTION W/ INTRAOCULAR LENS  IMPLANT, BILATERAL Bilateral    CORONARY ANGIOPLASTY     CORONARY BALLOON ANGIOPLASTY N/A 03/10/2017   Procedure: Coronary Balloon Angioplasty;  Surgeon: Runell Gess, MD;  Location: [facility] INVASIVE CV LAB;  Service: Cardiovascular;  Laterality: N/A;  CFX   CORONARY CTO INTERVENTION N/A 05/14/2017   Procedure: CORONARY CTO INTERVENTION;  Surgeon: Swaziland,  Peter M, MD;  Location: [facility] INVASIVE CV LAB;  Service: Cardiovascular;  Laterality: N/A;   LEFT HEART CATH AND CORONARY ANGIOGRAPHY N/A 03/10/2017   Procedure: Left Heart Cath and Coronary Angiography;  Surgeon: Runell Gess, MD;  Location: [facility] INVASIVE CV LAB;  Service: Cardiovascular;  Laterality: N/A;   PERICARDIOCENTESIS N/A 05/14/2017   Procedure: PERICARDIOCENTESIS;  Surgeon: Swaziland, Peter M, MD;  Location: [facility] INVASIVE CV LAB;  Service: Cardiovascular;  Laterality: N/A;   SUBXYPHOID PERICARDIAL WINDOW N/A 05/15/2017   Procedure: SUBXYPHOID PERICARDIAL WINDOW WITH DRAINAGE OF PERICARDIAL EFFUSION;  Surgeon: Alleen Borne, MD;  Location: [facility] OR;  Service: Thoracic;  Laterality: N/A;   ULTRASOUND GUIDANCE FOR VASCULAR ACCESS  05/14/2017   Procedure: Ultrasound Guidance For Vascular Access;  Surgeon: Swaziland, Peter M, MD;  Location: [facility] INVASIVE CV LAB;  Service: Cardiovascular;;    Social History   Socioeconomic History   Marital status: Divorced    Spouse name: Not on file   Number of children: Not on file   Years of education: Not on file   Highest education level: Not on file  Occupational History   Not on file  Tobacco Use   Smoking status: Former    Packs/day: 3.00    Years: 20.00    Total pack years: 60.00    Types: Cigarettes   Smokeless tobacco: Former    Types: Chew   Tobacco comments:    "quit smoking in the 1980s; chewed 4-5 years after I  quit smoking"  Vaping Use   Vaping Use: Never used  Substance and Sexual Activity   Alcohol use: Yes    Alcohol/week: 70.0 standard drinks of alcohol    Types: 70 Cans of beer per week    Comment: 05/14/2017 "6-10 beers/day"   Drug use: No   Sexual activity: Not on file  Other Topics Concern   Not on file  Social History Narrative   Not on file   Social Determinants of Health   Financial Resource Strain: Not on file  Food Insecurity: No Food Insecurity (05/13/2022)   Hunger Vital Sign    Worried About Running Out of Food  in the Last Year: Never true    Ran Out of Food in the Last Year: Never true  Transportation Needs: No Transportation Needs (05/13/2022)   PRAPARE - Administrator, Civil Service (Medical): No    Lack of Transportation (Non-Medical): No  Physical Activity: Not on file  Stress: Not on file  Social Connections: Not on file  Intimate Partner Violence: Not on file    Family History  Problem Relation Age of Onset   CAD Brother        CABG in his 16's    Current Outpatient Medications  Medication Sig Dispense Refill   allopurinol (ZYLOPRIM) 100 MG tablet TAKE 1 TABLET(100 MG) BY MOUTH TWICE DAILY 180 tablet 1   amLODipine (NORVASC) 5 MG tablet Take 5 mg by mouth daily.     aspirin EC 81 MG tablet Take 1 tablet (81 mg total) by mouth daily. Resume on 05/20/17. 30 tablet 12   ezetimibe (ZETIA) 10 MG tablet Take 1 tablet (10 mg total) by mouth daily. 90 tablet 3   ibuprofen (ADVIL) 600 MG tablet Take 1 tablet (600 mg total) by mouth every 8 (eight) hours as needed. 90 tablet 3   isosorbide mononitrate (IMDUR) 60 MG 24 hr tablet Take 1 tablet (60 mg total) by mouth daily. 90 tablet 3   lisinopril (ZESTRIL) 20 MG tablet TAKE 1 TABLET(20 MG) BY MOUTH DAILY 90 tablet 3   metoprolol succinate (TOPROL-XL) 50 MG 24 hr tablet Take 1 tablet (50 mg total) by mouth daily. 90 tablet 0   Multiple Vitamins-Minerals (PRESERVISION AREDS PO) Take 1 capsule by mouth 2 (two) times daily. Unknown strenght     nitroGLYCERIN (NITROSTAT) 0.4 MG SL tablet Place 1 tablet (0.4 mg total) under the tongue every 5 (five) minutes as needed for chest pain. 25 tablet 6   pantoprazole (PROTONIX) 40 MG tablet TAKE 1 TABLET(40 MG) BY MOUTH DAILY 90 tablet 1   pravastatin (PRAVACHOL) 20 MG tablet TAKE 1 TABLET BY MOUTH DAILY 90 tablet 2   No current facility-administered medications for this visit.    No Known Allergies   REVIEW OF SYSTEMS:   [X]  denotes positive finding, [ ]  denotes negative finding Cardiac   Comments:  Chest pain or chest pressure:    Shortness of breath upon exertion:    Short of breath when lying flat:    Irregular heart rhythm:        Vascular    Pain in calf, thigh, or hip brought on by ambulation:    Pain in feet at night that wakes you up from your sleep:     Blood clot in your veins:    Leg swelling:         Pulmonary    Oxygen at home:    Productive cough:  Wheezing:         Neurologic    Sudden weakness in arms or legs:     Sudden numbness in arms or legs:     Sudden onset of difficulty speaking or slurred speech:    Temporary loss of vision in one eye:     Problems with dizziness:         Gastrointestinal    Blood in stool:     Vomited blood:         Genitourinary    Burning when urinating:     Blood in urine:        Psychiatric    Major depression:         Hematologic    Bleeding problems:    Problems with blood clotting too easily:        Skin    Rashes or ulcers:        Constitutional    Fever or chills:      PHYSICAL EXAMINATION:  There were no vitals filed for this visit.  General:  WDWN in NAD; vital signs documented above Gait: Not observed HENT: WNL, normocephalic Pulmonary: normal non-labored breathing , without wheezing Cardiac: regular HR Abdomen: soft, NT, no masses Skin: without rashes Vascular Exam/Pulses:  Right Left  Radial 2+ (normal) 2+ (normal)                       Extremities: without ischemic changes, without Gangrene , without cellulitis; without open wounds;  Musculoskeletal: no muscle wasting or atrophy  Neurologic: A&O X 3;  No focal weakness or paresthesias are detected Psychiatric:  The pt has Normal affect.   Non-Invasive Vascular Imaging:   Summary:  Right Carotid: Velocities in the right ICA are consistent with a 40-59%                 stenosis.   Left Carotid: Velocities in the left ICA are consistent with a 1-39%  stenosis.   Vertebrals:  Bilateral vertebral arteries demonstrate  antegrade flow.  Subclavians: Normal flow hemodynamics were seen in bilateral subclavian               arteries.   *See table(s) above for measurements and observations.    ASSESSMENT/PLAN: Joseph Mercado is a 76 y.o. male presenting with bilateral internal carotid artery stenosis, right greater than left.  CTA and recent duplex ultrasound were reviewed.  The right internal carotid artery appears to be roughly 70% stenosed on CT.  Duplex ultrasonography demonstrates velocities consistent with moderate stenosis.  The left side internal carotid artery-the symptomatic side-demonstrated mild stenosis, less than 50%.  I had a long conversation with Joseph Mercado regarding the above.  We discussed that in the setting of asymptomatic carotid stenosis, which is what Joseph Mercado has in the right carotid, he would be best treated medically until the stenosis is greater than 80%.  At 80%, the risks of surgery outweigh the risks of surgery.    Symptomatic ICA stenosis is defined as stenosis greater than 50% with recent TIA, stroke, amaurosis.  Joseph Mercado has less than 40% stenosis in the left common carotid artery.  Joseph Mercado would benefit from best medical therapy for treatment of bilateral internal carotid arteries.    Being that the right internal carotid artery is approaching 80%, I plan to see him in 79-month intervals with repeat duplex ultrasonography.  With the discord between ultrasound velocities and CT angiogram stenosis, I plan to discuss revascularization should left carotid doppler  ultrasound increase in velocity at his next visit.   He was asked to continue best medical therapy-high intensity statin, aspirin. We discussed the signs and symptoms of stroke, amaurosis, TIA.  I asked that he seek immediate medical attention should any of these occur.  Joseph Sparrow, MD [facility] [phone]

## 2022-06-04 ENCOUNTER — Encounter: Payer: Self-pay | Admitting: Vascular Surgery

## 2022-06-04 ENCOUNTER — Ambulatory Visit (HOSPITAL_COMMUNITY)
Admission: RE | Admit: 2022-06-04 | Discharge: 2022-06-04 | Disposition: A | Discharge status: Home / Self Care | Payer: Medicare Other | Source: Ambulatory Visit | Attending: Vascular Surgery | Admitting: Vascular Surgery

## 2022-06-04 ENCOUNTER — Ambulatory Visit: Payer: Medicare Other | Admitting: Vascular Surgery

## 2022-06-04 VITALS — BP 129/81 | HR 92 | Temp 98.5°F | Resp 20 | Ht 71.0 in | Wt 230.0 lb

## 2022-06-04 DIAGNOSIS — I6523 Occlusion and stenosis of bilateral carotid arteries: Secondary | ICD-10-CM | POA: Insufficient documentation

## 2022-06-05 ENCOUNTER — Other Ambulatory Visit: Payer: Self-pay

## 2022-06-05 DIAGNOSIS — I10 Essential (primary) hypertension: Secondary | ICD-10-CM

## 2022-06-05 MED ORDER — AMLODIPINE BESYLATE 5 MG PO TABS: 5.0000 mg | ORAL_TABLET | Freq: Every day | ORAL | 1 refills | Status: DC

## 2022-06-06 ENCOUNTER — Other Ambulatory Visit: Payer: Self-pay

## 2022-06-06 DIAGNOSIS — I6523 Occlusion and stenosis of bilateral carotid arteries: Secondary | ICD-10-CM

## 2022-06-11 ENCOUNTER — Ambulatory Visit (INDEPENDENT_AMBULATORY_CARE_PROVIDER_SITE_OTHER): Payer: Medicare Other

## 2022-06-11 VITALS — BP 138/78 | HR 68 | Resp 16 | Ht 71.0 in | Wt 230.4 lb

## 2022-06-11 DIAGNOSIS — Z0000 Encounter for general adult medical examination without abnormal findings: Secondary | ICD-10-CM | POA: Diagnosis not present

## 2022-06-11 DIAGNOSIS — Z87891 Personal history of nicotine dependence: Secondary | ICD-10-CM

## 2022-06-11 NOTE — Patient Instructions (Signed)
Joseph Mercado , Thank you for taking time to come for your Medicare Wellness Visit. I appreciate your ongoing commitment to your health goals. Please review the following plan we discussed and let me know if I can assist you in the future.   Screening recommendations/referrals: Colonoscopy: No longer required Recommended yearly ophthalmology/optometry visit for glaucoma screening and checkup Recommended yearly dental visit for hygiene and checkup  Vaccinations: Influenza vaccine: up to date Pneumococcal vaccine: Complete Tdap vaccine: Due - get this at the pharmacy Shingles vaccine: schedule second dose at the pharmacy    Advanced directives: None - declined  Preventive Care 76 Years and Older, Male Preventive care refers to lifestyle choices and visits with your health care provider that can promote health and wellness. What does preventive care include? A yearly physical exam. This is also called an annual well check. Dental exams once or twice a year. Routine eye exams. Ask your health care provider how often you should have your eyes checked. Personal lifestyle choices, including: Daily care of your teeth and gums. Regular physical activity. Eating a healthy diet. Avoiding tobacco and drug use. Limiting alcohol use. Practicing safe sex. Taking low doses of aspirin every day. Taking vitamin and mineral supplements as recommended by your health care provider. What happens during an annual well check? The services and screenings done by your health care provider during your annual well check will depend on your age, overall health, lifestyle risk factors, and family history of disease. Counseling  Your health care provider may ask you questions about your: Alcohol use. Tobacco use. Drug use. Emotional well-being. Home and relationship well-being. Sexual activity. Eating habits. History of falls. Memory and ability to understand (cognition). Work and work  Statistician. Screening  You may have the following tests or measurements: Height, weight, and BMI. Blood pressure. Lipid and cholesterol levels. These may be checked every 5 years, or more frequently if you are over 65 years old. Skin check. Lung cancer screening. You may have this screening every year starting at age 76 if you have a 30-pack-year history of smoking and currently smoke or have quit within the past 15 years. Fecal occult blood test (FOBT) of the stool. You may have this test every year starting at age 76. Flexible sigmoidoscopy or colonoscopy. You may have a sigmoidoscopy every 5 years or a colonoscopy every 10 years starting at age 76. Prostate cancer screening. Recommendations will vary depending on your family history and other risks. Hepatitis C blood test. Hepatitis B blood test. Sexually transmitted disease (STD) testing. Diabetes screening. This is done by checking your blood sugar (glucose) after you have not eaten for a while (fasting). You may have this done every 1-3 years. Abdominal aortic aneurysm (AAA) screening. You may need this if you are a current or former smoker. Osteoporosis. You may be screened starting at age 76 if you are at high risk. Talk with your health care provider about your test results, treatment options, and if necessary, the need for more tests. Vaccines  Your health care provider may recommend certain vaccines, such as: Influenza vaccine. This is recommended every year. Tetanus, diphtheria, and acellular pertussis (Tdap, Td) vaccine. You may need a Td booster every 10 years. Zoster vaccine. You may need this after age 60. Pneumococcal 13-valent conjugate (PCV13) vaccine. One dose is recommended after age 76. Pneumococcal polysaccharide (PPSV23) vaccine. One dose is recommended after age 76. Talk to your health care provider about which screenings and vaccines you need and how  often you need them. This information is not intended to replace  advice given to you by your health care provider. Make sure you discuss any questions you have with your health care provider. Document Released: 09/01/2015 Document Revised: 04/24/2016 Document Reviewed: 06/06/2015 Elsevier Interactive Patient Education  2017 [place] Prevention in the Home Falls can cause injuries. They can happen to people of all ages. There are many things you can do to make your home safe and to help prevent falls. What can I do on the outside of my home? Regularly fix the edges of walkways and driveways and fix any cracks. Remove anything that might make you trip as you walk through a door, such as a raised step or threshold. Trim any bushes or trees on the path to your home. Use bright outdoor lighting. Clear any walking paths of anything that might make someone trip, such as rocks or tools. Regularly check to see if handrails are loose or broken. Make sure that both sides of any steps have handrails. Any raised decks and porches should have guardrails on the edges. Have any leaves, snow, or ice cleared regularly. Use sand or salt on walking paths during winter. Clean up any spills in your garage right away. This includes oil or grease spills. What can I do in the bathroom? Use night lights. Install grab bars by the toilet and in the tub and shower. Do not use towel bars as grab bars. Use non-skid mats or decals in the tub or shower. If you need to sit down in the shower, use a plastic, non-slip stool. Keep the floor dry. Clean up any water that spills on the floor as soon as it happens. Remove soap buildup in the tub or shower regularly. Attach bath mats securely with double-sided non-slip rug tape. Do not have throw rugs and other things on the floor that can make you trip. What can I do in the bedroom? Use night lights. Make sure that you have a light by your bed that is easy to reach. Do not use any sheets or blankets that are too big for your bed.  They should not hang down onto the floor. Have a firm chair that has side arms. You can use this for support while you get dressed. Do not have throw rugs and other things on the floor that can make you trip. What can I do in the kitchen? Clean up any spills right away. Avoid walking on wet floors. Keep items that you use a lot in easy-to-reach places. If you need to reach something above you, use a strong step stool that has a grab bar. Keep electrical cords out of the way. Do not use floor polish or wax that makes floors slippery. If you must use wax, use non-skid floor wax. Do not have throw rugs and other things on the floor that can make you trip. What can I do with my stairs? Do not leave any items on the stairs. Make sure that there are handrails on both sides of the stairs and use them. Fix handrails that are broken or loose. Make sure that handrails are as long as the stairways. Check any carpeting to make sure that it is firmly attached to the stairs. Fix any carpet that is loose or worn. Avoid having throw rugs at the top or bottom of the stairs. If you do have throw rugs, attach them to the floor with carpet tape. Make sure that you have a  light switch at the top of the stairs and the bottom of the stairs. If you do not have them, ask someone to add them for you. What else can I do to help prevent falls? Wear shoes that: Do not have high heels. Have rubber bottoms. Are comfortable and fit you well. Are closed at the toe. Do not wear sandals. If you use a stepladder: Make sure that it is fully opened. Do not climb a closed stepladder. Make sure that both sides of the stepladder are locked into place. Ask someone to hold it for you, if possible. Clearly mark and make sure that you can see: Any grab bars or handrails. First and last steps. Where the edge of each step is. Use tools that help you move around (mobility aids) if they are needed. These  include: Canes. Walkers. Scooters. Crutches. Turn on the lights when you go into a dark area. Replace any light bulbs as soon as they burn out. Set up your furniture so you have a clear path. Avoid moving your furniture around. If any of your floors are uneven, fix them. If there are any pets around you, be aware of where they are. Review your medicines with your doctor. Some medicines can make you feel dizzy. This can increase your chance of falling. Ask your doctor what other things that you can do to help prevent falls. This information is not intended to replace advice given to you by your health care provider. Make sure you discuss any questions you have with your health care provider. Document Released: 06/01/2009 Document Revised: 01/11/2016 Document Reviewed: 09/09/2014 Elsevier Interactive Patient Education  2017 [employer].

## 2022-06-11 NOTE — Progress Notes (Signed)
Subjective:   Joseph Mercado is a 76 y.o. male who presents for Medicare Annual/Subsequent preventive examination.  This wellness visit is conducted by a nurse.  The patient's medications were reviewed and reconciled since the patient's last visit.  History details were provided by the patient.  The history appears to be reliable.    Medical History: Patient history and Family history was reviewed  Medications, Allergies, and preventative health maintenance was reviewed and updated.  Cardiac Risk Factors include: advanced age (>79mn, >>75women);dyslipidemia;obesity (BMI >30kg/m2)     Objective:    Today's Vitals   06/11/22 0914  BP: 138/78  Pulse: 68  Resp: 16  Weight: 230 lb 6.4 oz (104.5 kg)  Height: '5\' 11"'$  (1.803 m)   Body mass index is 32.13 kg/m.     06/11/2022    9:26 AM 05/20/2017   12:25 AM 05/14/2017    7:06 AM 03/10/2017    9:36 AM  Advanced Directives  Does Patient Have a Medical Advance Directive? No No No No  Would patient like information on creating a medical advance directive? No - Patient declined No - Patient declined No - Patient declined No - Patient declined    Current Medications (verified) Outpatient Encounter Medications as of 06/11/2022  Medication Sig   allopurinol (ZYLOPRIM) 100 MG tablet TAKE 1 TABLET(100 MG) BY MOUTH TWICE DAILY   amLODipine (NORVASC) 5 MG tablet Take 1 tablet (5 mg total) by mouth daily.   aspirin EC 81 MG tablet Take 1 tablet (81 mg total) by mouth daily. Resume on 05/20/17.   ezetimibe (ZETIA) 10 MG tablet Take 1 tablet (10 mg total) by mouth daily.   ibuprofen (ADVIL) 600 MG tablet Take 1 tablet (600 mg total) by mouth every 8 (eight) hours as needed.   lisinopril (ZESTRIL) 20 MG tablet TAKE 1 TABLET(20 MG) BY MOUTH DAILY   Multiple Vitamins-Minerals (PRESERVISION AREDS PO) Take 1 capsule by mouth 2 (two) times daily. Unknown strenght   nitroGLYCERIN (NITROSTAT) 0.4 MG SL tablet Place 1 tablet (0.4 mg total) under the tongue  every 5 (five) minutes as needed for chest pain.   pantoprazole (PROTONIX) 40 MG tablet TAKE 1 TABLET(40 MG) BY MOUTH DAILY   pravastatin (PRAVACHOL) 20 MG tablet TAKE 1 TABLET BY MOUTH DAILY   isosorbide mononitrate (IMDUR) 60 MG 24 hr tablet Take 1 tablet (60 mg total) by mouth daily.   [DISCONTINUED] metoprolol succinate (TOPROL-XL) 50 MG 24 hr tablet Take 1 tablet (50 mg total) by mouth daily.   No facility-administered encounter medications on file as of 06/11/2022.    Allergies (verified) Patient has no known allergies.   History: Past Medical History:  Diagnosis Date   Angina pectoris ([facility]    CAD (coronary artery disease)    a. 02/2017: NSTEMI with cath showing 100% Prox Cx stenosis with 95% Mid Cx stenosis and 45% mid-LAD stenosis. Unsuccessful attempt at crossing an occluded mid nondominant circumflex --> plan for staged PCI in coming weeks   Chest pain 02/2017   Dyspnea    GERD (gastroesophageal reflux disease)    High cholesterol    History of gout    Hypertension    Macular degeneration    NSTEMI (non-ST elevated myocardial infarction) ([facility] 02/2017   Pericardial effusion 04/2017   Past Surgical History:  Procedure Laterality Date   CATARACT EXTRACTION W/ INTRAOCULAR LENS  IMPLANT, BILATERAL Bilateral    CORONARY ANGIOPLASTY     CORONARY BALLOON ANGIOPLASTY N/A 03/10/2017   Procedure: Coronary  Balloon Angioplasty;  Surgeon: Lorretta Harp, MD;  Location: [facility];  Service: Cardiovascular;  Laterality: N/A;  CFX   CORONARY CTO INTERVENTION N/A 05/14/2017   Procedure: CORONARY CTO INTERVENTION;  Surgeon: Martinique, Peter M, MD;  Location: [facility];  Service: Cardiovascular;  Laterality: N/A;   LEFT HEART CATH AND CORONARY ANGIOGRAPHY N/A 03/10/2017   Procedure: Left Heart Cath and Coronary Angiography;  Surgeon: Lorretta Harp, MD;  Location: [facility];  Service: Cardiovascular;  Laterality: N/A;   PERICARDIOCENTESIS N/A 05/14/2017    Procedure: PERICARDIOCENTESIS;  Surgeon: Martinique, Peter M, MD;  Location: [facility];  Service: Cardiovascular;  Laterality: N/A;   SUBXYPHOID PERICARDIAL WINDOW N/A 05/15/2017   Procedure: SUBXYPHOID PERICARDIAL WINDOW WITH DRAINAGE OF PERICARDIAL EFFUSION;  Surgeon: Gaye Pollack, MD;  Location: [facility];  Service: Thoracic;  Laterality: N/A;   ULTRASOUND GUIDANCE FOR VASCULAR ACCESS  05/14/2017   Procedure: Ultrasound Guidance For Vascular Access;  Surgeon: Martinique, Peter M, MD;  Location: [facility];  Service: Cardiovascular;;   Family History  Problem Relation Age of Onset   CAD Brother        CABG in his 10's   Social History   Socioeconomic History   Marital status: Divorced    Spouse name: Not on file   Number of children: Not on file   Years of education: Not on file   Highest education level: Not on file  Occupational History   Occupation: Retired Museum/gallery curator  Tobacco Use   Smoking status: Former    Packs/day: 3.00    Years: 20.00    Total pack years: 60.00    Types: Cigarettes   Smokeless tobacco: Former    Types: Chew   Tobacco comments:    "quit smoking in the 1980s; chewed 4-5 years after I quit smoking"  Vaping Use   Vaping Use: Never used  Substance and Sexual Activity   Alcohol use: Not Currently    Comment: 05/14/2017 "6-10 beers/day"  06/11/22 - 2016 cut down to three beers a day, has not had any alcohol in the past 6 weeks   Drug use: No   Sexual activity: Not on file  Other Topics Concern   Not on file  Social History Narrative   Not on file   Social Determinants of Health   Financial Resource Strain: [place]  (06/11/2022)   Overall Financial Resource Strain (CARDIA)    Difficulty of Paying Living Expenses: Not hard at all  Food Insecurity: No [place] (06/11/2022)   Hunger Vital Sign    Worried About Running Out of Food in the Last Year: Never true    [place] in the Last Year: Never true  Transportation Needs: No  Transportation Needs (06/11/2022)   PRAPARE - Hydrologist (Medical): No    Lack of Transportation (Non-Medical): No  Physical Activity: Inactive (06/11/2022)   Exercise Vital Sign    Days of Exercise per Week: 0 days    Minutes of Exercise per Session: 0 min  Stress: No Stress Concern Present (06/11/2022)   [place]    Feeling of Stress : Not at all  Social Connections: Not on file    Tobacco Counseling Counseling given: Not Answered Tobacco comments: "quit smoking in the 1980s; chewed 4-5 years after I quit smoking"   Clinical Intake:  Pre-visit preparation completed: Yes Pain :  No/denies pain   BMI - recorded: 32.13 Nutritional Status: BMI > 30  Obese Nutritional Risks: None Diabetes: No How often do you need to have someone help you when you read instructions, pamphlets, or other written materials from your doctor or pharmacy?: 1 - Never Interpreter Needed?: No    Activities of Daily Living    06/11/2022    9:26 AM  In your present state of health, do you have any difficulty performing the following activities:  Hearing? 0  Vision? 0  Difficulty concentrating or making decisions? 0  Walking or climbing stairs? 0  Dressing or bathing? 0  Doing errands, shopping? 0  Preparing Food and eating ? N  Using the Toilet? N  In the past six months, have you accidently leaked urine? N  Do you have problems with loss of bowel control? N  Managing your Medications? N  Managing your Finances? N  Housekeeping or managing your Housekeeping? N    Patient Care Team: Rochel Brome, MD as PCP - General (Internal Medicine) Park Liter, MD as Consulting Physician (Cardiology)     Assessment:   This is a routine wellness examination for Raihan.  Hearing/Vision screen No results found.  Dietary issues and exercise activities discussed: Current Exercise Habits: The patient  does not participate in regular exercise at present, Exercise limited by: None identified   Depression Screen    06/11/2022    9:23 AM 04/03/2022    8:01 AM 03/27/2021    8:00 AM 03/27/2020    9:23 AM  PHQ 2/9 Scores  PHQ - 2 Score 0 0 0 0    Fall Risk    06/11/2022    9:26 AM 04/03/2022    8:01 AM 10/02/2021    7:46 AM 03/27/2021    8:00 AM 08/22/2020    1:19 PM  Fall Risk   Falls in the past year? 0 0 0 0 0  Number falls in past yr: 0 0 0 0 0  Injury with Fall? 0 0 0 0 0  Risk for fall due to : No Fall Risks No Fall Risks No Fall Risks    Follow up Falls evaluation completed;Falls prevention discussed Falls evaluation completed Falls evaluation completed      FALL RISK PREVENTION PERTAINING TO THE HOME:  Any stairs in or around the home? Yes  one step up to door If so, are there any without handrails? No  Home free of loose throw rugs in walkways, pet beds, electrical cords, etc? Yes  Adequate lighting in your home to reduce risk of falls? No   ASSISTIVE DEVICES UTILIZED TO PREVENT FALLS:  Life alert? No  Use of a cane, walker or w/c? No  Grab bars in the bathroom? No  Shower chair or bench in shower? No  Elevated toilet seat or a handicapped toilet? No   Gait steady and fast without use of assistive device  Cognitive Function:        06/11/2022    9:27 AM  6CIT Screen  What Year? 0 points  What month? 0 points  What time? 0 points  Count back from 20 0 points  Months in reverse 0 points  Repeat phrase 0 points  Total Score 0 points    Immunizations Immunization History  Administered Date(s) Administered   Fluad Quad(high Dose 65+) 05/22/2020, 05/31/2021, 05/22/2022   Influenza, High Dose Seasonal PF 05/15/2017   Moderna Covid-19 Vaccine Bivalent Booster 8yr & up 05/31/2021   Moderna Sars-Covid-2 Vaccination  10/02/2019, 10/30/2019, 06/13/2020   Pneumococcal Conjugate-13 03/16/2014   Pneumococcal Polysaccharide-23 07/12/2015   Zoster Recombinat  (Shingrix) 04/03/2022    TDAP status: Due, Education has been provided regarding the importance of this vaccine. Advised may receive this vaccine at local pharmacy or Health Dept. Aware to provide a copy of the vaccination record if obtained from local pharmacy or Health Dept. Verbalized acceptance and understanding.  Flu Vaccine status: Up to date  Pneumococcal vaccine status: Up to date  Covid-19 vaccine status: Declined, Education has been provided regarding the importance of this vaccine but patient still declined. Advised may receive this vaccine at local pharmacy or Health Dept.or vaccine clinic. Aware to provide a copy of the vaccination record if obtained from local pharmacy or Health Dept. Verbalized acceptance and understanding.  Qualifies for Shingles Vaccine? Yes   Zostavax completed No   Shingrix Completed?: No.    Education has been provided regarding the importance of this vaccine. Patient has been advised to call insurance company to determine out of pocket expense if they have not yet received this vaccine. Advised may also receive vaccine at local pharmacy or Health Dept. Verbalized acceptance and understanding.  Screening Tests Health Maintenance  Topic Date Due   TETANUS/TDAP  Never done   Lung Cancer Screening  Never done   Medicare Annual Wellness (AWV)  02/12/2020   COVID-19 Vaccine (5 - Moderna series) 10/01/2021   Zoster Vaccines- Shingrix (2 of 2) 05/29/2022   Hepatitis C Screening  10/02/2022 (Originally 08/28/1963)   Pneumonia Vaccine 36+ Years old  Completed   INFLUENZA VACCINE  Completed   HPV VACCINES  Aged Out   COLONOSCOPY (Pts 45-90yr Insurance coverage will need to be confirmed)  Discontinued    Health Maintenance  Health Maintenance Due  Topic Date Due   TETANUS/TDAP  Never done   Lung Cancer Screening  Never done   Medicare Annual Wellness (AWV)  02/12/2020   COVID-19 Vaccine (5 - Moderna series) 10/01/2021   Zoster Vaccines- Shingrix (2 of 2)  05/29/2022    Colorectal cancer screening: Type of screening: Colonoscopy. Completed 12/2011. No longer required  Lung Cancer Screening: (Low Dose CT Chest recommended if Age 113-80years, 30 pack-year currently smoking OR have quit w/in 15years.) does not qualify. Quit >15 years ago   Additional Screening:  Vision Screening: Recommended annual ophthalmology exams for early detection of glaucoma and other disorders of the eye. Is the patient up to date with their annual eye exam?  Yes   Dental Screening: Recommended annual dental exams for proper oral hygiene  Community Resource Referral / Chronic Care Management: CRR required this visit?  No   CCM required this visit?  No      Plan:    1- Get second Shingrix Vaccine and Tdap at the pharmacy 2- Exercise recommended  I have personally reviewed and noted the following in the patient's chart:   Medical and social history Use of alcohol, tobacco or illicit drugs  Current medications and supplements including opioid prescriptions.  Functional ability and status Nutritional status Physical activity Advanced directives List of other physicians Hospitalizations, surgeries, and ER visits in previous 12 months Vitals Screenings to include cognitive, depression, and falls Referrals and appointments  In addition, I have reviewed and discussed with patient certain preventive protocols, quality metrics, and best practice recommendations. A written personalized care plan for preventive services as well as general preventive health recommendations were provided to patient.     KErie Noe LPN  06/11/2022    

## 2022-08-30 ENCOUNTER — Other Ambulatory Visit: Payer: Self-pay | Admitting: Cardiology

## 2022-08-31 ENCOUNTER — Other Ambulatory Visit: Payer: Self-pay | Admitting: Cardiology

## 2022-10-03 DIAGNOSIS — H3530 Unspecified macular degeneration: Secondary | ICD-10-CM | POA: Insufficient documentation

## 2022-10-04 ENCOUNTER — Other Ambulatory Visit: Payer: Self-pay | Admitting: Cardiology

## 2022-10-04 ENCOUNTER — Encounter: Payer: Self-pay | Admitting: Cardiology

## 2022-10-04 ENCOUNTER — Ambulatory Visit: Payer: Medicare Other | Attending: Cardiology | Admitting: Cardiology

## 2022-10-04 ENCOUNTER — Other Ambulatory Visit: Payer: Self-pay

## 2022-10-04 VITALS — BP 112/68 | HR 93 | Ht 71.0 in | Wt 236.2 lb

## 2022-10-04 DIAGNOSIS — I25119 Atherosclerotic heart disease of native coronary artery with unspecified angina pectoris: Secondary | ICD-10-CM

## 2022-10-04 DIAGNOSIS — E785 Hyperlipidemia, unspecified: Secondary | ICD-10-CM

## 2022-10-04 DIAGNOSIS — I6930 Unspecified sequelae of cerebral infarction: Secondary | ICD-10-CM

## 2022-10-04 DIAGNOSIS — J4489 Other specified chronic obstructive pulmonary disease: Secondary | ICD-10-CM | POA: Diagnosis not present

## 2022-10-04 DIAGNOSIS — I10 Essential (primary) hypertension: Secondary | ICD-10-CM

## 2022-10-04 HISTORY — DX: Unspecified sequelae of cerebral infarction: I69.30

## 2022-10-04 MED ORDER — ISOSORBIDE MONONITRATE ER 60 MG PO TB24: 60.0000 mg | ORAL_TABLET | Freq: Every day | ORAL | 3 refills | Status: DC

## 2022-10-04 MED ORDER — NITROGLYCERIN 0.4 MG SL SUBL: 0.4000 mg | SUBLINGUAL_TABLET | SUBLINGUAL | 6 refills | Status: AC | PRN

## 2022-10-04 NOTE — Telephone Encounter (Signed)
Refills for NTG sl and Isosorbide Mononitrate sent to pharmacy

## 2022-10-04 NOTE — Progress Notes (Signed)
Cardiology Office Note:    Date:  10/04/2022   ID:  Joseph Mercado, DOB November 16, 1945, MRN [id number]  PCP:  Blane Ohara, MD  Cardiologist:  Gypsy Balsam, MD    Referring MD: Blane Ohara, MD   No chief complaint on file.   History of Present Illness:    Joseph Mercado is a 77 y.o. male  who is seen for follow up CAD. He has a history of HTN and family history of CAD.  Seen in June 2018 with a several month history of progressive DOE and chest pain on exertion. Echo showed mild LVH with grade 2 diastolic dysfunction. Valves were OK. Myoview study showed inferolateral ischemia. This led to a cardiac cath on 03/10/17 which showed CTO of the LCx with collaterals to a fairly large OM. PCI was attempted but unable to cross completely into the large OM so unable to recanalize the vessel. It was noted that this was a long and difficult procedure with radiation dose of 4 gray. When seen in follow up he was still having some intermittent chest pain and after reviewing options decided to proceed with attempt at CTO PCI. This was performed on 05/14/17. We were unable to complete the procedure. We were able to cross the lesion in subintimal tract but never able to reenter the true lumen distally. He had no problems during the procedure but about 4 hours later developed acute hypotension and chest pain. Echo revealed a pericardial effusion. He underwent emergent pericardiocentesis with removal of 500 cc of blood. He stabilized and was observed overnight. No drainage from catheter and Echo showed minimal effusion. The catheter was withdrawn and the patient again developed acute hemodynamic instability. He was taken to the OR and a pericardial window was performed. There was no active bleeding site identified. His post op course was uncomplicated  So far I have seen him only once in general was 2023.  Then he disappeared from follow-up.  He was referred back to [place] because few months ago he ended up suffering from stroke.   It happened in September.  He came to hospital with fever some confusion he got COVID-19 infection at the same time he was find to have small area of acute infarct in the left frontal white matter and putamen.  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  He has been follow-up by vascular surgeon for that. He comes today to my office for to follow-up.  Overall cardiac wise denies have any symptoms.  There is no chest pain tightness squeezing pressure burning chest.  He said he does not have any residual changes after his stroke.  Past Medical History:  Diagnosis Date   Angina pectoris (HCC)    CAD (coronary artery disease)    a. 02/2017: NSTEMI with cath showing 100% Prox Cx stenosis with 95% Mid Cx stenosis and 45% mid-LAD stenosis. Unsuccessful attempt at crossing an occluded mid nondominant circumflex --> plan for staged PCI in coming weeks   Chest pain 02/2017   Dyspnea    GERD (gastroesophageal reflux disease)    High cholesterol    History of gout    Hypertension    Macular degeneration    NSTEMI (non-ST elevated myocardial infarction) (HCC) 02/2017   Pericardial effusion 04/2017    Past Surgical History:  Procedure Laterality Date   CATARACT EXTRACTION W/ INTRAOCULAR LENS  IMPLANT, BILATERAL Bilateral    CORONARY ANGIOPLASTY     CORONARY BALLOON ANGIOPLASTY N/A 03/10/2017   Procedure: Coronary Balloon Angioplasty;  Surgeon: Runell Gess, MD;  Location: [facility] INVASIVE CV LAB;  Service: Cardiovascular;  Laterality: N/A;  CFX   CORONARY CTO INTERVENTION N/A 05/14/2017   Procedure: CORONARY CTO INTERVENTION;  Surgeon: Swaziland, Peter M, MD;  Location: [facility] INVASIVE CV LAB;  Service: Cardiovascular;  Laterality: N/A;   LEFT HEART CATH AND CORONARY ANGIOGRAPHY N/A 03/10/2017   Procedure: Left Heart Cath and Coronary Angiography;  Surgeon: Runell Gess, MD;  Location: [facility] INVASIVE  CV LAB;  Service: Cardiovascular;  Laterality: N/A;   PERICARDIOCENTESIS N/A 05/14/2017   Procedure: PERICARDIOCENTESIS;  Surgeon: Swazil[place], Peter M, MD;  Location: [facility] INVASIVE CV LAB;  Service: Cardiovascular;  Laterality: N/A;   SUBXYPHOID PERICARDIAL WINDOW N/A 05/15/2017   Procedure: SUBXYPHOID PERICARDIAL WINDOW WITH DRAINAGE OF PERICARDIAL EFFUSION;  Surgeon: Alleen Borne, MD;  Location: [facility] OR;  Service: Thoracic;  Laterality: N/A;   ULTRASOUND GUIDANCE FOR VASCULAR ACCESS  05/14/2017   Procedure: Ultrasound Guidance For Vascular Access;  Surgeon: Swaziland, Peter M, MD;  Location: [facility] INVASIVE CV LAB;  Service: Cardiovascular;;    Current Medications: Current Meds  Medication Sig   allopurinol (ZYLOPRIM) 100 MG tablet TAKE 1 TABLET(100 MG) BY MOUTH TWICE DAILY (Patient taking differently: Take 100 mg by mouth 2 (two) times daily as needed (pain).)   amLODipine (NORVASC) 5 MG tablet Take 1 tablet (5 mg total) by mouth daily.   aspirin EC 81 MG tablet Take 1 tablet (81 mg total) by mouth daily. Resume on 05/20/17.   ezetimibe (ZETIA) 10 MG tablet Take 1 tablet (10 mg total) by mouth daily.   ibuprofen (ADVIL) 600 MG tablet Take 1 tablet (600 mg total) by mouth every 8 (eight) hours as needed.   lisinopril (ZESTRIL) 20 MG tablet TAKE 1 TABLET(20 MG) BY MOUTH DAILY   Multiple Vitamins-Minerals (PRESERVISION AREDS PO) Take 1 capsule by mouth 2 (two) times daily. Unknown strenght   pantoprazole (PROTONIX) 40 MG tablet TAKE 1 TABLET(40 MG) BY MOUTH DAILY   pravastatin (PRAVACHOL) 20 MG tablet TAKE 1 TABLET BY MOUTH DAILY   [DISCONTINUED] isosorbide mononitrate (IMDUR) 60 MG 24 hr tablet Take 1 tablet (60 mg total) by mouth daily. Patient must keep appointment on 10/04/22 for further refills. 1 st attempt   [DISCONTINUED] nitroGLYCERIN (NITROSTAT) 0.4 MG SL tablet Place 1 tablet (0.4 mg total) under the tongue every 5 (five) minutes as needed for chest pain.     Allergies:   Patient has no known  allergies.   Social History   Socioeconomic History   Marital status: Divorced    Spouse name: Not on file   Number of children: Not on file   Years of education: Not on file   Highest education level: Not on file  Occupational History   Occupation: Retired Proofreader  Tobacco Use   Smoking status: Former    Packs/day: 3.00    Years: 20.00    Total pack years: 60.00    Types: Cigarettes   Smokeless tobacco: Former    Types: Chew   Tobacco comments:    "quit smoking in the 1980s; chewed 4-5 years after I quit smoking"  Vaping Use   Vaping Use: Never used  Substance and Sexual Activity   Alcohol use: Not Currently    Comment: 05/14/2017 "6-10 beers/day"  06/11/22 - 2016 cut down to three beers a day, has not  had any alcohol in the past 6 weeks   Drug use: No   Sexual activity: Not on file  Other Topics Concern   Not on file  Social History Narrative   Not on file   Social Determinants of Health   Financial Resource Strain: Low Risk  (06/11/2022)   Overall Financial Resource Strain (CARDIA)    Difficulty of Paying Living Expenses: Not hard at all  Food Insecurity: No Food Insecurity (06/11/2022)   Hunger Vital Sign    Worried About Running Out of Food in the Last Year: Never true    Ran Out of Food in the Last Year: Never true  Transportation Needs: No Transportation Needs (06/11/2022)   PRAPARE - Administrator, Civil Service (Medical): No    Lack of Transportation (Non-Medical): No  Physical Activity: Inactive (06/11/2022)   Exercise Vital Sign    Days of Exercise per Week: 0 days    Minutes of Exercise per Session: 0 min  Stress: No Stress Concern Present (06/11/2022)   [employer] of Occupational Health - Occupational Stress Questionnaire    Feeling of Stress : Not at all  Social Connections: Not on file     Family History: The patient's family history includes CAD in his brother. ROS:   Please see the history of present illness.    All 14  point review of systems negative except as described per history of present illness  EKGs/Labs/Other Studies Reviewed:      Recent Labs: 05/22/2022: ALT 23; BUN 15; Creatinine, Ser 1.05; Hemoglobin 13.6; Platelets 302; Potassium 5.2; Sodium 131  Recent Lipid Panel    Component Value Date/Time   CHOL 145 04/03/2022 0852   TRIG 112 04/03/2022 0852   HDL 48 04/03/2022 0852   CHOLHDL 3.0 04/03/2022 0852   CHOLHDL 3.3 03/11/2017 0214   VLDL 14 03/11/2017 0214   LDLCALC 77 04/03/2022 0852    Physical Exam:    VS:  BP 112/68 (BP Location: Right Arm, Patient Position: Sitting)   Pulse 93   Ht 5\' 11"  (1.803 m)   Wt 236 lb 3.2 oz (107.1 kg)   SpO2 97%   BMI 32.94 kg/m     Wt Readings from Last 3 Encounters:  10/04/22 236 lb 3.2 oz (107.1 kg)  06/11/22 230 lb 6.4 oz (104.5 kg)  06/04/22 230 lb (104.3 kg)     GEN:  Well nourished, well developed in no acute distress HEENT: Normal NECK: No JVD; No carotid bruits LYMPHATICS: No lymphadenopathy CARDIAC: RRR, no murmurs, no rubs, no gallops RESPIRATORY:  Clear to auscultation without rales, wheezing or rhonchi  ABDOMEN: Soft, non-tender, non-distended MUSCULOSKELETAL:  No edema; No deformity  SKIN: Warm and dry LOWER EXTREMITIES: no swelling NEUROLOGIC:  Alert and oriented x 3 PSYCHIATRIC:  Normal affect   ASSESSMENT:    1. Coronary artery disease involving native coronary artery of native heart with angina pectoris (HCC)   2. Essential hypertension   3. Obstructive chronic bronchitis without exacerbation   4. Hyperlipidemia LDL goal <70   5. Late effect of cerebrovascular accident (CVA)    PLAN:    In order of problems listed above:  Coronary artery disease.  Stable from that point review on antiplatelet therapy which I will continue.  Denies have any signs and symptoms that would indicate reactivation of the problem. Dyslipidemia he is on low intensity statin pravastatin 20 this is the only 1 he can tolerate.  In group,  he is also on  Zetia 10 mg daily I did review his K PN which show me his LDL 77 HDL 48 this is from August of last year.  He is scheduled to see his primary care physician on Monday and he will have fasting lipid profile done at that time.  I would like to see his LDL less than 70 if we can.  Probably addition of Nexlizet will be appropriate with discontinuation of Zetia if his cholesterol still LDL more than 70. Late effect of CVA, likely he does not have any residual changes.  Question about etiology.  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  Therefore will not pursue investigation for atrial fibrillation. Essential hypertension blood pressure well-controlled continue present management.   Medication Adjustments/Labs and Tests Ordered: Current medicines are reviewed at length with the patient today.  Concerns regarding medicines are outlined above.  No orders of the defined types were placed in this encounter.  Medication changes: No orders of the defined types were placed in this encounter.   Signed, Georgeanna Lea, MD, [facility] 10/04/2022 3:46 PM     [facility]

## 2022-10-04 NOTE — Patient Instructions (Signed)

## 2022-10-06 NOTE — Assessment & Plan Note (Addendum)
Continue lisinopril 20 mg daily, amlodipine 5 mg daily, Imdur 60 mg daily, and aspirin 81 mg daily.

## 2022-10-06 NOTE — Assessment & Plan Note (Addendum)
Well controlled.  No changes to medicines. Pravastatin 20 mg daily, Zetia 10 mg daily Continue to work on eating a healthy diet and exercise.  Labs drawn today.

## 2022-10-06 NOTE — Assessment & Plan Note (Signed)
The current medical regimen is effective;  continue present plan and medications.  Pantoprazole 40 mg daily

## 2022-10-06 NOTE — Assessment & Plan Note (Signed)
Well controlled.  No changes to medicines. Continue amlodipine, imdur, and lisinopril. Continue to work on eating a healthy diet and exercise.  Labs drawn today.

## 2022-10-06 NOTE — Progress Notes (Unsigned)
Subjective:  Patient ID: Joseph Mercado, male    DOB: 1946/01/20  Age: 77 y.o. MRN: [id number]  Chief Complaint  Patient presents with   Hyperlipidemia   Hypertension   HPI Hyperlipidemia.   On Pravastatin 20 mg daily, Zetia 10 mg daily without problems.  Eats healthy and exercises.   Hypertensive heart disease.  Patient has a history of coronary artery disease with a myocardial infarction in 2018.  Per the patient left heart cath showed significant blockage which was unable to be stented.  No CABG was done at that time.  Patient did have complications of pericardial tamponade afterwards.  Patient denies chest pain or shortness of breath.  Currently on lisinopril 20 mg daily, amlodipine 5 mg daily, Imdur 60 mg daily, and aspirin 81 mg daily  CAD with angina: Taking Isosorbide 60 mg daily, Aspirin 81 mg daily. no CHF EF 55-60% LHC:  Mid LAD lesion, 45 %stenosed. Mid Cx lesion, 95 %stenosed. Prox Cx to Mid Cx lesion, 100 %stenosed. Post intervention, there is a 95% residual stenosis. The left ventricular systolic function is normal. LV end diastolic pressure is normal. The left ventricular ejection fraction is 55-65% by visual estimate.    History of stroke in 04/2022. Sequelae is imbalance. Neck CTA showed 70% stenosis of the right ICA, left ICA no stenosis.   MRI showed an acute infarct of left frontal lobe.   Echocardiogram normal.  EEG negative. At the time of the stroke it was recommended he follow-up with vascular surgery as well as neurology. Patient has seen the vascular surgeon, Nolon Bussing, MD.  Patient does not appear to have seen neurology.     06/11/2022    9:23 AM 04/03/2022    8:01 AM 03/27/2021    8:00 AM 03/27/2020    9:23 AM  Depression screen PHQ 2/9  Decreased Interest 0 0 0 0  Down, Depressed, Hopeless 0 0 0 0  PHQ - 2 Score 0 0 0 0         08/22/2020    1:19 PM 03/27/2021    8:00 AM 10/02/2021    7:46 AM 04/03/2022    8:01 AM 06/11/2022    9:26 AM  Fall  Risk  Falls in the past year? 0 0 0 0 0  Was there an injury with Fall? 0 0 0 0 0  Fall Risk Category Calculator 0 0 0 0 0  Fall Risk Category (Retired) Low Low Low Low Low  (RETIRED) Patient Fall Risk Level Low fall risk Low fall risk Low fall risk Low fall risk Low fall risk  Patient at Risk for Falls Due to   No Fall Risks No Fall Risks No Fall Risks  Fall risk Follow up   Falls evaluation completed Falls evaluation completed Falls evaluation completed;Falls prevention discussed     Review of Systems  Constitutional:  Negative for chills and fever.  HENT:  Negative for congestion, rhinorrhea and sore throat.   Respiratory:  Negative for cough and shortness of breath.   Cardiovascular:  Negative for chest pain and palpitations.  Gastrointestinal:  Positive for constipation. Negative for abdominal pain, diarrhea, nausea and vomiting.  Genitourinary:  Negative for dysuria and urgency.  Musculoskeletal:  Negative for arthralgias, back pain and myalgias.       Bilateral foot pain.   Neurological:  Negative for dizziness and headaches.       Balance issues secondary to stroke.  Psychiatric/Behavioral:  Negative for dysphoric mood. The patient is not  nervous/anxious.    Patient has a history of a stroke in the fall 2023.  The only remaining symptom he has is some imbalance.  Workup included an echocardiogram, vascular carotids.  Current Outpatient Medications on File Prior to Visit  Medication Sig Dispense Refill   allopurinol (ZYLOPRIM) 100 MG tablet TAKE 1 TABLET(100 MG) BY MOUTH TWICE DAILY (Patient taking differently: Take 100 mg by mouth 2 (two) times daily as needed (pain).) 180 tablet 1   amLODipine (NORVASC) 5 MG tablet Take 1 tablet (5 mg total) by mouth daily. 90 tablet 1   aspirin EC 81 MG tablet Take 1 tablet (81 mg total) by mouth daily. Resume on 05/20/17. 30 tablet 12   ezetimibe (ZETIA) 10 MG tablet Take 1 tablet (10 mg total) by mouth daily. 90 tablet 3   ibuprofen (ADVIL)  600 MG tablet Take 1 tablet (600 mg total) by mouth every 8 (eight) hours as needed. 90 tablet 3   isosorbide mononitrate (IMDUR) 60 MG 24 hr tablet Take 1 tablet (60 mg total) by mouth daily. 90 tablet 3   lisinopril (ZESTRIL) 20 MG tablet TAKE 1 TABLET(20 MG) BY MOUTH DAILY 90 tablet 3   Multiple Vitamins-Minerals (PRESERVISION AREDS PO) Take 1 capsule by mouth 2 (two) times daily. Unknown strenght     nitroGLYCERIN (NITROSTAT) 0.4 MG SL tablet Place 1 tablet (0.4 mg total) under the tongue every 5 (five) minutes as needed for chest pain. 25 tablet 6   pantoprazole (PROTONIX) 40 MG tablet TAKE 1 TABLET(40 MG) BY MOUTH DAILY 90 tablet 1   pravastatin (PRAVACHOL) 20 MG tablet TAKE 1 TABLET BY MOUTH DAILY 90 tablet 2   No current facility-administered medications on file prior to visit.   Past Medical History:  Diagnosis Date   Angina pectoris (HCC)    CAD (coronary artery disease)    a. 02/2017: NSTEMI with cath showing 100% Prox Cx stenosis with 95% Mid Cx stenosis and 45% mid-LAD stenosis. Unsuccessful attempt at crossing an occluded mid nondominant circumflex --> plan for staged PCI in coming weeks   Cardiac/pericardial tamponade 05/14/2017   Chest pain 02/2017   Dyspnea    Encephalopathy acute 05/30/2022   GERD (gastroesophageal reflux disease)    High cholesterol    History of gout    Hypertension    Macular degeneration    NSTEMI (non-ST elevated myocardial infarction) (HCC) 02/2017   Pericardial effusion 04/2017   Pericardial tamponade    Past Surgical History:  Procedure Laterality Date   CATARACT EXTRACTION W/ INTRAOCULAR LENS  IMPLANT, BILATERAL Bilateral    CORONARY ANGIOPLASTY     CORONARY BALLOON ANGIOPLASTY N/A 03/10/2017   Procedure: Coronary Balloon Angioplasty;  Surgeon: Runell Gess, MD;  Location: [facility] INVASIVE CV LAB;  Service: Cardiovascular;  Laterality: N/A;  CFX   CORONARY CTO INTERVENTION N/A 05/14/2017   Procedure: CORONARY CTO INTERVENTION;  Surgeon:  Swaziland, Peter M, MD;  Location: [facility] INVASIVE CV LAB;  Service: Cardiovascular;  Laterality: N/A;   LEFT HEART CATH AND CORONARY ANGIOGRAPHY N/A 03/10/2017   Procedure: Left Heart Cath and Coronary Angiography;  Surgeon: Runell Gess, MD;  Location: [facility] INVASIVE CV LAB;  Service: Cardiovascular;  Laterality: N/A;   PERICARDIOCENTESIS N/A 05/14/2017   Procedure: PERICARDIOCENTESIS;  Surgeon: Swaziland, Peter M, MD;  Location: [facility] INVASIVE CV LAB;  Service: Cardiovascular;  Laterality: N/A;   SUBXYPHOID PERICARDIAL WINDOW N/A 05/15/2017   Procedure: SUBXYPHOID PERICARDIAL WINDOW WITH DRAINAGE OF PERICARDIAL EFFUSION;  Surgeon: Alleen Borne, MD;  Location: [facility] OR;  Service: Thoracic;  Laterality: N/A;   ULTRASOUND GUIDANCE FOR VASCULAR ACCESS  05/14/2017   Procedure: Ultrasound Guidance For Vascular Access;  Surgeon: Swaziland, Peter M, MD;  [facility] INVASIVE CV LAB;  Service: Cardiovascular;;    Family History  Problem Relation Age of Onset   CAD Brother        CABG in his 79's   Social History   Socioeconomic History   Marital status: Divorced    Spouse name: Not on file   Number of children: Not on file   Years of education: Not on file   Highest education level: Not on file  Occupational History   Occupation: Retired Proofreader  Tobacco Use   Smoking status: Former    Packs/day: 3.00    Years: 20.00    Total pack years: 60.00    Types: Cigarettes   Smokeless tobacco: Former    Types: Chew   Tobacco comments:    "quit smoking in the 1980s; chewed 4-5 years after I quit smoking"  Vaping Use   Vaping Use: Never used  Substance and Sexual Activity   Alcohol use: Not Currently    Comment: 05/14/2017 "6-10 beers/day"  06/11/22 - 2016 cut down to three beers a day, has not had any alcohol in the past 6 weeks   Drug use: No   Sexual activity: Not on file  Other Topics Concern   Not on file  Social History Narrative   Not on file   Social Determinants of Health   Financial Resource  Strain: Low Risk  (06/11/2022)   Overall Financial Resource Strain (CARDIA)    Difficulty of Paying Living Expenses: Not hard at all  Food Insecurity: No Food Insecurity (06/11/2022)   Hunger Vital Sign    Worried About Running Out of Food in the Last Year: Never true    Ran Out of Food in the Last Year: Never true  Transportation Needs: No Transportation Needs (06/11/2022)   PRAPARE - Administrator, Civil Service (Medical): No    Lack of Transportation (Non-Medical): No  Physical Activity: Inactive (06/11/2022)   Exercise Vital Sign    Days of Exercise per Week: 0 days    Minutes of Exercise per Session: 0 min  Stress: No Stress Concern Present (06/11/2022)   [employer] of Occupational Health - Occupational Stress Questionnaire    Feeling of Stress : Not at all  Social Connections: Not on file    Objective:  BP 136/64   Pulse 88   Temp (!) 97.3 F (36.3 C)   Resp 16   Ht 5\' 11"  (1.803 m)   Wt 236 lb (107 kg)   BMI 32.92 kg/m      10/07/2022    7:22 AM 10/04/2022    3:26 PM 10/04/2022    3:08 PM  BP/Weight  Systolic BP 136 112 124  Diastolic BP 64 68 68  Wt. (Lbs) 236  236.2  BMI 32.92 kg/m2  32.94 kg/m2    Physical Exam Vitals reviewed.  Constitutional:      Appearance: Normal appearance. He is obese.  Neck:     Vascular: No carotid bruit.  Cardiovascular:     Rate and Rhythm: Normal rate and regular rhythm.     Pulses: Normal pulses.     Heart sounds: Normal heart sounds.  Pulmonary:     Effort: Pulmonary effort is normal.     Breath sounds: Normal breath sounds. No wheezing, rhonchi or  rales.  Abdominal:     General: Bowel sounds are normal.     Palpations: Abdomen is soft.     Tenderness: There is no abdominal tenderness.  Neurological:     Mental Status: He is alert and oriented to person, place, and time.  Psychiatric:        Mood and Affect: Mood normal.        Behavior: Behavior normal.     Diabetic Foot Exam - Simple   No  data filed      Lab Results  Component Value Date   WBC 13.6 (H) 05/22/2022   HGB 13.6 05/22/2022   HCT 43.2 05/22/2022   PLT 302 05/22/2022   GLUCOSE 93 05/22/2022   CHOL 145 04/03/2022   TRIG 112 04/03/2022   HDL 48 04/03/2022   LDLCALC 77 04/03/2022   ALT 23 05/22/2022   AST 24 05/22/2022   NA 131 (L) 05/22/2022   K 5.2 05/22/2022   CL 94 (L) 05/22/2022   CREATININE 1.05 05/22/2022   BUN 15 05/22/2022   CO2 22 05/22/2022   TSH 2.160 10/02/2021   INR 1.11 05/20/2017   HGBA1C 4.7 (L) 05/20/2017      Assessment & Plan:    Hypertensive heart disease without heart failure Assessment & Plan: Continue lisinopril 20 mg daily, amlodipine 5 mg daily, Imdur 60 mg daily, and aspirin 81 mg daily.  Orders: -     Comprehensive metabolic panel -     CBC with Differential/Platelet  Gastroesophageal reflux disease without esophagitis Assessment & Plan: The current medical regimen is effective;  continue present plan and medications.  Pantoprazole 40 mg daily   Hyperlipidemia LDL goal <70 Assessment & Plan: Well controlled.  No changes to medicines. Pravastatin 20 mg daily, Zetia 10 mg daily Continue to work on eating a healthy diet and exercise.  Labs drawn today.     Mixed hyperlipidemia Assessment & Plan: Well controlled.  No changes to medicines. Pravastatin 20 mg daily, Zetia 10 mg daily Continue to work on eating a healthy diet and exercise.  Labs drawn today.    Orders: -     Lipid panel -     TSH  Coronary artery disease of native artery of native heart with stable angina pectoris [facility]) Assessment & Plan: The current medical regimen is effective;  continue present plan and medications. Medical management. Continue pravastatin 20 mg daily, Zetia 10 mg daily, isosorbide 60 mg daily, Aspirin 81 mg daily.    Impaired balance as late effect of cerebrovascular accident (CVA) Assessment & Plan: Continue aspirin, pravastatin 20 mg daily and zetia 10 mg  daily. Continue exercising.    Chronic gouty arthritis Assessment & Plan: Continue allopurinol 100 mg twice daily   Right-sided extracranial carotid artery stenosis Assessment & Plan: Follow-up with vascular surgeon regularly. Continue pravastatin 20 mg daily and aspirin 81 mg daily.     Orders Placed This Encounter  Procedures   Comprehensive metabolic panel   Lipid panel   CBC with Differential/Platelet   TSH     Follow-up: Return in about 3 months (around 01/05/2023) for chronic fasting.   I,Marla I Leal-Borjas,acting as a scribe for Blane Ohara, MD.,have documented all relevant documentation on the behalf of Blane Ohara, MD,as directed by  Blane Ohara, MD while in the presence of Blane Ohara, MD.   An After Visit Summary was printed and given to the patient.  Blane Ohara, MD [facility] [phone]

## 2022-10-07 ENCOUNTER — Ambulatory Visit (INDEPENDENT_AMBULATORY_CARE_PROVIDER_SITE_OTHER): Payer: Medicare Other | Admitting: Family Medicine

## 2022-10-07 ENCOUNTER — Encounter: Payer: Self-pay | Admitting: Family Medicine

## 2022-10-07 VITALS — BP 136/64 | HR 88 | Temp 97.3°F | Resp 16 | Ht 71.0 in | Wt 236.0 lb

## 2022-10-07 DIAGNOSIS — E782 Mixed hyperlipidemia: Secondary | ICD-10-CM

## 2022-10-07 DIAGNOSIS — I25118 Atherosclerotic heart disease of native coronary artery with other forms of angina pectoris: Secondary | ICD-10-CM

## 2022-10-07 DIAGNOSIS — M1A00X0 Idiopathic chronic gout, unspecified site, without tophus (tophi): Secondary | ICD-10-CM | POA: Diagnosis not present

## 2022-10-07 DIAGNOSIS — E785 Hyperlipidemia, unspecified: Secondary | ICD-10-CM | POA: Diagnosis not present

## 2022-10-07 DIAGNOSIS — I25119 Atherosclerotic heart disease of native coronary artery with unspecified angina pectoris: Secondary | ICD-10-CM

## 2022-10-07 DIAGNOSIS — I69398 Other sequelae of cerebral infarction: Secondary | ICD-10-CM | POA: Insufficient documentation

## 2022-10-07 DIAGNOSIS — I6521 Occlusion and stenosis of right carotid artery: Secondary | ICD-10-CM

## 2022-10-07 DIAGNOSIS — I10 Essential (primary) hypertension: Secondary | ICD-10-CM

## 2022-10-07 DIAGNOSIS — I119 Hypertensive heart disease without heart failure: Secondary | ICD-10-CM

## 2022-10-07 DIAGNOSIS — R2689 Other abnormalities of gait and mobility: Secondary | ICD-10-CM

## 2022-10-07 DIAGNOSIS — K219 Gastro-esophageal reflux disease without esophagitis: Secondary | ICD-10-CM | POA: Diagnosis not present

## 2022-10-07 HISTORY — DX: Atherosclerotic heart disease of native coronary artery with other forms of angina pectoris: I25.118

## 2022-10-07 HISTORY — DX: Other sequelae of cerebral infarction: I69.398

## 2022-10-07 NOTE — Assessment & Plan Note (Addendum)
Continue allopurinol 100 mg twice daily

## 2022-10-07 NOTE — Assessment & Plan Note (Signed)
Continue aspirin, pravastatin 20 mg daily and zetia 10 mg daily. Continue exercising.

## 2022-10-07 NOTE — Assessment & Plan Note (Signed)
The current medical regimen is effective;  continue present plan and medications. Medical management. Continue pravastatin 20 mg daily, Zetia 10 mg daily, isosorbide 60 mg daily, Aspirin 81 mg daily.

## 2022-10-07 NOTE — Assessment & Plan Note (Signed)
Follow-up with vascular surgeon regularly. Continue pravastatin 20 mg daily and aspirin 81 mg daily.

## 2022-10-08 LAB — CBC WITH DIFFERENTIAL/PLATELET
Basophils Absolute: 0.1 10*3/uL (ref 0.0–0.2)
Basos: 1 %
EOS (ABSOLUTE): 0.2 10*3/uL (ref 0.0–0.4)
Eos: 3 %
Hematocrit: 39.4 % (ref 37.5–51.0)
Hemoglobin: 12.4 g/dL — ABNORMAL LOW (ref 13.0–17.7)
Immature Grans (Abs): 0 10*3/uL (ref 0.0–0.1)
Immature Granulocytes: 0 %
Lymphocytes Absolute: 1.9 10*3/uL (ref 0.7–3.1)
Lymphs: 23 %
MCH: 26.3 pg — ABNORMAL LOW (ref 26.6–33.0)
MCHC: 31.5 g/dL (ref 31.5–35.7)
MCV: 84 fL (ref 79–97)
Monocytes Absolute: 0.8 10*3/uL (ref 0.1–0.9)
Monocytes: 9 %
Neutrophils Absolute: 5.3 10*3/uL (ref 1.4–7.0)
Neutrophils: 64 %
Platelets: 287 10*3/uL (ref 150–450)
RBC: 4.72 x10E6/uL (ref 4.14–5.80)
RDW: 13.6 % (ref 11.6–15.4)
WBC: 8.3 10*3/uL (ref 3.4–10.8)

## 2022-10-08 LAB — COMPREHENSIVE METABOLIC PANEL
ALT: 18 IU/L (ref 0–44)
AST: 25 IU/L (ref 0–40)
Albumin/Globulin Ratio: 1.8 (ref 1.2–2.2)
Albumin: 4.2 g/dL (ref 3.8–4.8)
Alkaline Phosphatase: 100 IU/L (ref 44–121)
BUN/Creatinine Ratio: 7 — ABNORMAL LOW (ref 10–24)
BUN: 7 mg/dL — ABNORMAL LOW (ref 8–27)
Bilirubin Total: 0.6 mg/dL (ref 0.0–1.2)
CO2: 23 mmol/L (ref 20–29)
Calcium: 9.4 mg/dL (ref 8.6–10.2)
Chloride: 97 mmol/L (ref 96–106)
Creatinine, Ser: 1.03 mg/dL (ref 0.76–1.27)
Globulin, Total: 2.4 g/dL (ref 1.5–4.5)
Glucose: 92 mg/dL (ref 70–99)
Potassium: 4.6 mmol/L (ref 3.5–5.2)
Sodium: 134 mmol/L (ref 134–144)
Total Protein: 6.6 g/dL (ref 6.0–8.5)
eGFR: 75 mL/min/{1.73_m2} (ref >59)

## 2022-10-08 LAB — LIPID PANEL
Chol/HDL Ratio: 2.6 ratio (ref 0.0–5.0)
Cholesterol, Total: 134 mg/dL (ref 100–199)
HDL: 52 mg/dL (ref >39)
LDL Chol Calc (NIH): 65 mg/dL (ref 0–99)
Triglycerides: 86 mg/dL (ref 0–149)
VLDL Cholesterol Cal: 17 mg/dL (ref 5–40)

## 2022-10-08 LAB — CARDIOVASCULAR RISK ASSESSMENT

## 2022-10-08 LAB — TSH: TSH: 1.7 u[IU]/mL (ref 0.450–4.500)

## 2022-10-08 NOTE — Progress Notes (Signed)
Blood count abnormal.  Mild anemia. Recheck cbc in 4 weeks.   Liver function normal.  Kidney function normal.  Thyroid function normal.  Cholesterol: Great

## 2022-10-14 ENCOUNTER — Other Ambulatory Visit: Payer: Self-pay | Admitting: Family Medicine

## 2022-10-18 ENCOUNTER — Other Ambulatory Visit: Payer: Self-pay | Admitting: Cardiology

## 2022-11-04 ENCOUNTER — Other Ambulatory Visit: Payer: Self-pay

## 2022-11-04 DIAGNOSIS — D729 Disorder of white blood cells, unspecified: Secondary | ICD-10-CM

## 2022-11-05 ENCOUNTER — Other Ambulatory Visit: Payer: Medicare Other

## 2022-11-05 DIAGNOSIS — D729 Disorder of white blood cells, unspecified: Secondary | ICD-10-CM | POA: Diagnosis not present

## 2022-11-05 LAB — CBC WITH DIFFERENTIAL/PLATELET
Basophils Absolute: 0.1 10*3/uL (ref 0.0–0.2)
Basos: 1 %
EOS (ABSOLUTE): 0.2 10*3/uL (ref 0.0–0.4)
Eos: 2 %
Hematocrit: 39.1 % (ref 37.5–51.0)
Hemoglobin: 12.7 g/dL — ABNORMAL LOW (ref 13.0–17.7)
Immature Grans (Abs): 0 10*3/uL (ref 0.0–0.1)
Immature Granulocytes: 0 %
Lymphocytes Absolute: 2.1 10*3/uL (ref 0.7–3.1)
Lymphs: 23 %
MCH: 26.7 pg (ref 26.6–33.0)
MCHC: 32.5 g/dL (ref 31.5–35.7)
MCV: 82 fL (ref 79–97)
Monocytes Absolute: 0.7 10*3/uL (ref 0.1–0.9)
Monocytes: 7 %
Neutrophils Absolute: 6.3 10*3/uL (ref 1.4–7.0)
Neutrophils: 67 %
Platelets: 273 10*3/uL (ref 150–450)
RBC: 4.76 x10E6/uL (ref 4.14–5.80)
RDW: 14.1 % (ref 11.6–15.4)
WBC: 9.4 10*3/uL (ref 3.4–10.8)

## 2022-11-11 DIAGNOSIS — L820 Inflamed seborrheic keratosis: Secondary | ICD-10-CM | POA: Diagnosis not present

## 2022-11-11 DIAGNOSIS — L578 Other skin changes due to chronic exposure to nonionizing radiation: Secondary | ICD-10-CM | POA: Diagnosis not present

## 2022-11-11 DIAGNOSIS — L821 Other seborrheic keratosis: Secondary | ICD-10-CM | POA: Diagnosis not present

## 2022-11-20 DIAGNOSIS — H26493 Other secondary cataract, bilateral: Secondary | ICD-10-CM | POA: Diagnosis not present

## 2022-11-20 DIAGNOSIS — H35371 Puckering of macula, right eye: Secondary | ICD-10-CM | POA: Diagnosis not present

## 2022-11-20 DIAGNOSIS — H353131 Nonexudative age-related macular degeneration, bilateral, early dry stage: Secondary | ICD-10-CM | POA: Diagnosis not present

## 2022-11-26 ENCOUNTER — Other Ambulatory Visit: Payer: Self-pay | Admitting: Family Medicine

## 2022-11-26 ENCOUNTER — Other Ambulatory Visit: Payer: Self-pay | Admitting: Cardiology

## 2022-11-30 ENCOUNTER — Other Ambulatory Visit: Payer: Self-pay | Admitting: Family Medicine

## 2022-11-30 DIAGNOSIS — I10 Essential (primary) hypertension: Secondary | ICD-10-CM

## 2022-12-19 ENCOUNTER — Other Ambulatory Visit: Payer: Self-pay | Admitting: Family Medicine

## 2023-01-02 DIAGNOSIS — H35371 Puckering of macula, right eye: Secondary | ICD-10-CM | POA: Diagnosis not present

## 2023-03-19 ENCOUNTER — Other Ambulatory Visit: Payer: Self-pay | Admitting: Family Medicine

## 2023-04-06 NOTE — Assessment & Plan Note (Signed)
Well controlled.  No changes to medicines. Pravastatin 20 mg daily, Zetia 10 mg daily Continue to work on eating a healthy diet and exercise.  Labs drawn today.

## 2023-04-06 NOTE — Assessment & Plan Note (Signed)
The current medical regimen is effective;  continue present plan and medications. Pantoprazole 40 mg daily. 

## 2023-04-06 NOTE — Progress Notes (Signed)
Subjective:  Patient ID: Joseph Mercado, male    DOB: May 05, 1946  Age: 77 y.o. MRN: [id number]  Chief Complaint  Patient presents with   Medical Management of Chronic Issues    HPI Hyperlipidemia.   On Pravastatin 20 mg daily, Zetia 10 mg daily without problems.  Eats healthy and exercises.   Hypertensive heart disease.  Patient has a history of coronary artery disease with a myocardial infarction in 2018.  Currently on lisinopril 20 mg daily, amlodipine 5 mg daily, Imdur 60 mg daily, and aspirin 81 mg daily  CAD with angina: Taking Isosorbide 60 mg daily, Aspirin 81 mg daily. no CHF EF 55-60%  History of stroke in 04/2022. Sequelae is imbalance.     06/11/2022    9:23 AM 04/03/2022    8:01 AM 03/27/2021    8:00 AM 03/27/2020    9:23 AM  Depression screen PHQ 2/9  Decreased Interest 0 0 0 0  Down, Depressed, Hopeless 0 0 0 0  PHQ - 2 Score 0 0 0 0        06/11/2022    9:26 AM  Fall Risk   Falls in the past year? 0  Number falls in past yr: 0  Injury with Fall? 0  Risk for fall due to : No Fall Risks  Follow up Falls evaluation completed;Falls prevention discussed    Patient Care Team: Blane Ohara, MD as PCP - General (Internal Medicine) Georgeanna Lea, MD as Consulting Physician (Cardiology) Victorino Sparrow, MD as Consulting Physician (Vascular Surgery) Cherly Beach, MD as Referring Physician (Family Medicine)   Review of Systems  Constitutional:  Negative for chills, diaphoresis, fatigue and fever.  HENT:  Negative for congestion, ear pain and sore throat.   Respiratory:  Negative for cough and shortness of breath.   Cardiovascular:  Negative for chest pain and leg swelling.  Gastrointestinal:  Negative for abdominal pain, constipation, diarrhea, nausea and vomiting.  Genitourinary:  Negative for dysuria and urgency.  Musculoskeletal:  Positive for back pain (Baseline). Negative for arthralgias and myalgias.  Neurological:  Negative for dizziness and  headaches.  Psychiatric/Behavioral:  Negative for dysphoric mood.     Current Outpatient Medications on File Prior to Visit  Medication Sig Dispense Refill   allopurinol (ZYLOPRIM) 100 MG tablet Take 1 tablet (100 mg total) by mouth 2 (two) times daily as needed (pain). 180 tablet 1   amLODipine (NORVASC) 5 MG tablet TAKE 1 TABLET(5 MG) BY MOUTH DAILY 90 tablet 1   aspirin EC 81 MG tablet Take 1 tablet (81 mg total) by mouth daily. Resume on 05/20/17. 30 tablet 12   ezetimibe (ZETIA) 10 MG tablet TAKE 1 TABLET(10 MG) BY MOUTH DAILY 90 tablet 3   isosorbide mononitrate (IMDUR) 60 MG 24 hr tablet Take 1 tablet (60 mg total) by mouth daily. 90 tablet 3   Multiple Vitamins-Minerals (PRESERVISION AREDS PO) Take 1 capsule by mouth 2 (two) times daily. Unknown strenght     nitroGLYCERIN (NITROSTAT) 0.4 MG SL tablet Place 1 tablet (0.4 mg total) under the tongue every 5 (five) minutes as needed for chest pain. 25 tablet 6   pantoprazole (PROTONIX) 40 MG tablet TAKE 1 TABLET(40 MG) BY MOUTH DAILY 90 tablet 1   pravastatin (PRAVACHOL) 20 MG tablet TAKE 1 TABLET BY MOUTH DAILY 90 tablet 0   No current facility-administered medications on file prior to visit.   Past Medical History:  Diagnosis Date   Angina pectoris (HCC)  CAD (coronary artery disease)    a. 02/2017: NSTEMI with cath showing 100% Prox Cx stenosis with 95% Mid Cx stenosis and 45% mid-LAD stenosis. Unsuccessful attempt at crossing an occluded mid nondominant circumflex --> plan for staged PCI in coming weeks   Cardiac/pericardial tamponade 05/14/2017   Chest pain 02/2017   Dyspnea    Encephalopathy acute 05/30/2022   GERD (gastroesophageal reflux disease)    High cholesterol    History of gout    Hypertension    Macular degeneration    NSTEMI (non-ST elevated myocardial infarction) (HCC) 02/2017   Pericardial effusion 04/2017   Pericardial tamponade    Past Surgical History:  Procedure Laterality Date   CATARACT EXTRACTION  W/ INTRAOCULAR LENS  IMPLANT, BILATERAL Bilateral    CORONARY ANGIOPLASTY     CORONARY BALLOON ANGIOPLASTY N/A 03/10/2017   Procedure: Coronary Balloon Angioplasty;  Surgeon: Runell Gess, MD;  Location: [facility] INVASIVE CV LAB;  Service: Cardiovascular;  Laterality: N/A;  CFX   CORONARY CTO INTERVENTION N/A 05/14/2017   Procedure: CORONARY CTO INTERVENTION;  Surgeon: Swaziland, Peter M, MD;  Location: [facility] INVASIVE CV LAB;  Service: Cardiovascular;  Laterality: N/A;   LEFT HEART CATH AND CORONARY ANGIOGRAPHY N/A 03/10/2017   Procedure: Left Heart Cath and Coronary Angiography;  Surgeon: Runell Gess, MD;  Location: [facility] INVASIVE CV LAB;  Service: Cardiovascular;  Laterality: N/A;   PERICARDIOCENTESIS N/A 05/14/2017   Procedure: PERICARDIOCENTESIS;  Surgeon: Swaziland, Peter M, MD;  Location: [facility] INVASIVE CV LAB;  Service: Cardiovascular;  Laterality: N/A;   SUBXYPHOID PERICARDIAL WINDOW N/A 05/15/2017   Procedure: SUBXYPHOID PERICARDIAL WINDOW WITH DRAINAGE OF PERICARDIAL EFFUSION;  Surgeon: Alleen Borne, MD;  Location: [facility] OR;  Service: Thoracic;  Laterality: N/A;   ULTRASOUND GUIDANCE FOR VASCULAR ACCESS  05/14/2017   Procedure: Ultrasound Guidance For Vascular Access;  Surgeon: Swaziland, Peter M, MD;  Location: [facility] INVASIVE CV LAB;  Service: Cardiovascular;;    Family History  Problem Relation Age of Onset   CAD Brother        CABG in his 23's   Social History   Socioeconomic History   Marital status: Divorced    Spouse name: Not on file   Number of children: Not on file   Years of education: Not on file   Highest education level: Not on file  Occupational History   Occupation: Retired Proofreader  Tobacco Use   Smoking status: Former    Current packs/day: 3.00    Average packs/day: 3.0 packs/day for 20.0 years (60.0 ttl pk-yrs)    Types: Cigarettes   Smokeless tobacco: Former    Types: Chew   Tobacco comments:    "quit smoking in the 1980s; chewed 4-5 years after I quit smoking"  Vaping  Use   Vaping status: Never Used  Substance and Sexual Activity   Alcohol use: Not Currently    Comment: 05/14/2017 "6-10 beers/day"  06/11/22 - 2016 cut down to three beers a day, has not had any alcohol in the past 6 weeks   Drug use: No   Sexual activity: Not on file  Other Topics Concern   Not on file  Social History Narrative   Not on file   Social Determinants of Health   Financial Resource Strain: Low Risk  (06/11/2022)   Overall Financial Resource Strain (CARDIA)    Difficulty of Paying Living Expenses: Not hard at all  Food Insecurity: No Food Insecurity (06/11/2022)   Hunger Vital Sign    Worried About  Running Out of Food in the Last Year: Never true    Ran Out of Food in the Last Year: Never true  Transportation Needs: No Transportation Needs (06/11/2022)   PRAPARE - Administrator, Civil Service (Medical): No    Lack of Transportation (Non-Medical): No  Physical Activity: Inactive (04/07/2023)   Exercise Vital Sign    Days of Exercise per Week: 0 days    Minutes of Exercise per Session: 0 min  Stress: No Stress Concern Present (06/11/2022)   [employer] of Occupational Health - Occupational Stress Questionnaire    Feeling of Stress : Not at all  Social Connections: Moderately Integrated (04/07/2023)   Social Connection and Isolation Panel [NHANES]    Frequency of Communication with Friends and Family: More than three times a week    Frequency of Social Gatherings with Friends and Family: More than three times a week    Attends Religious Services: More than 4 times per year    Active Member of [employer] or Organizations: No    Attends Engineer, structural: Never    Marital Status: Married    Objective:  BP 122/60   Pulse 89   Temp (!) 96.7 F (35.9 C)   Ht 5\' 11"  (1.803 m)   Wt 236 lb (107 kg)   SpO2 99%   BMI 32.92 kg/m      04/07/2023    7:57 AM 10/07/2022    7:22 AM 10/04/2022    3:26 PM  BP/Weight  Systolic BP 122 136 112   Diastolic BP 60 64 68  Wt. (Lbs) 236 236   BMI 32.92 kg/m2 32.92 kg/m2     Physical Exam Vitals reviewed.  Constitutional:      Appearance: Normal appearance.  Neck:     Vascular: No carotid bruit.  Cardiovascular:     Rate and Rhythm: Normal rate and regular rhythm.     Pulses: Normal pulses.     Heart sounds: Normal heart sounds.  Pulmonary:     Effort: Pulmonary effort is normal.     Breath sounds: Normal breath sounds. No wheezing, rhonchi or rales.  Abdominal:     General: Bowel sounds are normal.     Palpations: Abdomen is soft.     Tenderness: There is no abdominal tenderness.  Neurological:     Mental Status: He is alert.  Psychiatric:        Mood and Affect: Mood normal.        Behavior: Behavior normal.     Diabetic Foot Exam - Simple   No data filed      Lab Results  Component Value Date   WBC 9.7 04/07/2023   HGB 12.1 (L) 04/07/2023   HCT 40.2 04/07/2023   PLT 265 04/07/2023   GLUCOSE 89 04/07/2023   CHOL 164 04/07/2023   TRIG 149 04/07/2023   HDL 46 04/07/2023   LDLCALC 92 04/07/2023   ALT 12 04/07/2023   AST 19 04/07/2023   NA 135 04/07/2023   K 5.3 (H) 04/07/2023   CL 96 04/07/2023   CREATININE 1.16 04/07/2023   BUN 10 04/07/2023   CO2 23 04/07/2023   TSH 1.700 10/07/2022   INR 1.11 05/20/2017   HGBA1C 4.7 (L) 05/20/2017      Assessment & Plan:    Hypertensive heart disease without heart failure Assessment & Plan: At goal. Continue lisinopril 20 mg daily, amlodipine 5 mg daily, Imdur 60 mg daily, and aspirin 81 mg daily.  Orders: -     CBC with Differential/Platelet -     Comprehensive metabolic panel  Gastroesophageal reflux disease without esophagitis Assessment & Plan: The current medical regimen is effective;  continue present plan and medications.  Pantoprazole 40 mg daily   Mixed hyperlipidemia Assessment & Plan: Well controlled.  No changes to medicines. Pravastatin 20 mg daily, Zetia 10 mg daily Continue to  work on eating a healthy diet and exercise.  Labs drawn today.    Orders: -     Lipid panel  Dyspnea on exertion Assessment & Plan: Ordered troponin I and came back negative.  Refer to cardiology.  EKG unchanged. Order Chest Xray  Orders: -     DG Chest 2 View; Future  Lumbar pain Assessment & Plan: Sent Ibuprofen 600 mg every 8 hours PRN  Orders: -     Ibuprofen; Take 1 tablet (600 mg total) by mouth every 8 (eight) hours as needed.  Dispense: 90 tablet; Refill: 1     Meds ordered this encounter  Medications   ibuprofen (ADVIL) 600 MG tablet    Sig: Take 1 tablet (600 mg total) by mouth every 8 (eight) hours as needed.    Dispense:  90 tablet    Refill:  1    Orders Placed This Encounter  Procedures   DG Chest 2 View   CBC with Differential/Platelet   Comprehensive metabolic panel   Lipid panel     Follow-up: Return in about 6 months (around 10/08/2023) for chronic, chronic follow up, awv/flu shot early 06/2023.Clayborn Bigness I Leal-Borjas,acting as a scribe for Blane Ohara, MD.,have documented all relevant documentation on the behalf of Blane Ohara, MD,as directed by  Blane Ohara, MD while in the presence of Blane Ohara, MD.   An After Visit Summary was printed and given to the patient.  Blane Ohara, MD [facility] [phone]

## 2023-04-06 NOTE — Assessment & Plan Note (Signed)
Continue lisinopril 20 mg daily, amlodipine 5 mg daily, Imdur 60 mg daily, and aspirin 81 mg daily.

## 2023-04-07 ENCOUNTER — Ambulatory Visit (INDEPENDENT_AMBULATORY_CARE_PROVIDER_SITE_OTHER): Payer: Medicare Other | Admitting: Family Medicine

## 2023-04-07 ENCOUNTER — Encounter: Payer: Self-pay | Admitting: Family Medicine

## 2023-04-07 VITALS — BP 122/60 | HR 89 | Temp 96.7°F | Ht 71.0 in | Wt 236.0 lb

## 2023-04-07 DIAGNOSIS — K219 Gastro-esophageal reflux disease without esophagitis: Secondary | ICD-10-CM

## 2023-04-07 DIAGNOSIS — R0602 Shortness of breath: Secondary | ICD-10-CM | POA: Diagnosis not present

## 2023-04-07 DIAGNOSIS — R0609 Other forms of dyspnea: Secondary | ICD-10-CM | POA: Diagnosis not present

## 2023-04-07 DIAGNOSIS — M545 Low back pain, unspecified: Secondary | ICD-10-CM

## 2023-04-07 DIAGNOSIS — E782 Mixed hyperlipidemia: Secondary | ICD-10-CM

## 2023-04-07 DIAGNOSIS — I119 Hypertensive heart disease without heart failure: Secondary | ICD-10-CM

## 2023-04-07 DIAGNOSIS — Z122 Encounter for screening for malignant neoplasm of respiratory organs: Secondary | ICD-10-CM

## 2023-04-07 MED ORDER — IBUPROFEN 600 MG PO TABS
600.0000 mg | ORAL_TABLET | Freq: Three times a day (TID) | ORAL | 1 refills | Status: AC | PRN
Start: 2023-04-07 — End: ?

## 2023-04-07 NOTE — Patient Instructions (Signed)
Flu shots available 04/22/2023

## 2023-04-08 DIAGNOSIS — Z122 Encounter for screening for malignant neoplasm of respiratory organs: Secondary | ICD-10-CM | POA: Insufficient documentation

## 2023-04-08 DIAGNOSIS — M545 Low back pain, unspecified: Secondary | ICD-10-CM | POA: Insufficient documentation

## 2023-04-08 HISTORY — DX: Low back pain, unspecified: M54.50

## 2023-04-08 HISTORY — DX: Encounter for screening for malignant neoplasm of respiratory organs: Z12.2

## 2023-04-08 LAB — COMPREHENSIVE METABOLIC PANEL
ALT: 12 IU/L (ref 0–44)
AST: 19 IU/L (ref 0–40)
Albumin: 4.3 g/dL (ref 3.8–4.8)
Alkaline Phosphatase: 113 IU/L (ref 44–121)
BUN/Creatinine Ratio: 9 — ABNORMAL LOW (ref 10–24)
BUN: 10 mg/dL (ref 8–27)
Bilirubin Total: 0.6 mg/dL (ref 0.0–1.2)
CO2: 23 mmol/L (ref 20–29)
Calcium: 9.4 mg/dL (ref 8.6–10.2)
Chloride: 96 mmol/L (ref 96–106)
Creatinine, Ser: 1.16 mg/dL (ref 0.76–1.27)
Globulin, Total: 2.6 g/dL (ref 1.5–4.5)
Glucose: 89 mg/dL (ref 70–99)
Potassium: 5.3 mmol/L — ABNORMAL HIGH (ref 3.5–5.2)
Sodium: 135 mmol/L (ref 134–144)
Total Protein: 6.9 g/dL (ref 6.0–8.5)
eGFR: 65 mL/min/{1.73_m2} (ref >59)

## 2023-04-08 LAB — CBC WITH DIFFERENTIAL/PLATELET
Basophils Absolute: 0.1 10*3/uL (ref 0.0–0.2)
Basos: 1 %
EOS (ABSOLUTE): 0.1 10*3/uL (ref 0.0–0.4)
Eos: 1 %
Hematocrit: 40.2 % (ref 37.5–51.0)
Hemoglobin: 12.1 g/dL — ABNORMAL LOW (ref 13.0–17.7)
Immature Grans (Abs): 0 10*3/uL (ref 0.0–0.1)
Immature Granulocytes: 0 %
Lymphocytes Absolute: 2.3 10*3/uL (ref 0.7–3.1)
Lymphs: 24 %
MCH: 24.2 pg — ABNORMAL LOW (ref 26.6–33.0)
MCHC: 30.1 g/dL — ABNORMAL LOW (ref 31.5–35.7)
MCV: 80 fL (ref 79–97)
Monocytes Absolute: 0.8 10*3/uL (ref 0.1–0.9)
Monocytes: 8 %
Neutrophils Absolute: 6.3 10*3/uL (ref 1.4–7.0)
Neutrophils: 66 %
Platelets: 265 10*3/uL (ref 150–450)
RBC: 5 x10E6/uL (ref 4.14–5.80)
RDW: 14.9 % (ref 11.6–15.4)
WBC: 9.7 10*3/uL (ref 3.4–10.8)

## 2023-04-08 LAB — LIPID PANEL
Chol/HDL Ratio: 3.6 ratio (ref 0.0–5.0)
Cholesterol, Total: 164 mg/dL (ref 100–199)
HDL: 46 mg/dL (ref >39)
LDL Chol Calc (NIH): 92 mg/dL (ref 0–99)
Triglycerides: 149 mg/dL (ref 0–149)
VLDL Cholesterol Cal: 26 mg/dL (ref 5–40)

## 2023-04-08 NOTE — Assessment & Plan Note (Addendum)
Ordered troponin I and came back negative.  Refer to cardiology.  EKG unchanged. Order Chest Xray

## 2023-04-09 ENCOUNTER — Other Ambulatory Visit: Payer: Self-pay

## 2023-04-09 MED ORDER — LOSARTAN POTASSIUM 50 MG PO TABS: 50.0000 mg | ORAL_TABLET | Freq: Every day | ORAL | 0 refills | Status: DC

## 2023-04-11 NOTE — Assessment & Plan Note (Signed)
Sent Ibuprofen 600 mg every 8 hours PRN

## 2023-04-14 ENCOUNTER — Other Ambulatory Visit: Payer: Self-pay | Admitting: Family Medicine

## 2023-04-14 ENCOUNTER — Encounter: Payer: Self-pay | Admitting: Family Medicine

## 2023-05-08 ENCOUNTER — Ambulatory Visit: Payer: Medicare Other | Attending: Cardiology | Admitting: Cardiology

## 2023-05-08 VITALS — BP 136/70 | HR 95 | Ht 71.0 in | Wt 238.0 lb

## 2023-05-08 DIAGNOSIS — R0609 Other forms of dyspnea: Secondary | ICD-10-CM

## 2023-05-08 DIAGNOSIS — I25118 Atherosclerotic heart disease of native coronary artery with other forms of angina pectoris: Secondary | ICD-10-CM | POA: Diagnosis not present

## 2023-05-08 DIAGNOSIS — E782 Mixed hyperlipidemia: Secondary | ICD-10-CM

## 2023-05-08 MED ORDER — NEXLIZET 180-10 MG PO TABS: 1.0000 | ORAL_TABLET | Freq: Every day | ORAL | Status: DC

## 2023-05-08 MED ORDER — NEXLIZET 180-10 MG PO TABS: 1.0000 | ORAL_TABLET | Freq: Every day | ORAL | 6 refills | Status: DC

## 2023-05-08 NOTE — Progress Notes (Signed)
Cardiology Office Note:    Date:  05/08/2023   ID:  Joseph Mercado, DOB 27-Mar-1946, MRN [id number]  PCP:  Blane Ohara, MD  Cardiologist:  Gypsy Balsam, MD    Referring MD: Blane Ohara, MD   Chief Complaint  Patient presents with   Follow-up    History of Present Illness:    Joseph Mercado is a 77 y.o. male with past medical history significant for essential hypertension, coronary disease in June 2018 he had cardiac catheterization done he was found to have complete occlusion of circumflex artery with collateralization for occluded large obtuse marginal.  PCI was attempted however we unable to cross the lesion.  Then a second attempt was made in September 2018 procedure was complicated by pericardial effusion that required pericardiocentesis more than 500 cc of blood has been removed.  After that patient has been stabilized however shortly thereafter drain has been removed he end up having problems again and then eventually pericardial window has been done.  Since that time he seems to be doing fine additional problem include essential hypertension, dyslipidemia.  Denies have any chest pain tightness squeezing pressure burning chest.  He tells me that he slows down a lot.  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 02/2017: NSTEMI with cath showing 100% Prox Cx stenosis with 95% Mid Cx stenosis and 45% mid-LAD stenosis. Unsuccessful attempt at crossing an occluded mid nondominant circumflex --> plan for staged PCI in coming weeks   Cardiac/pericardial tamponade 05/14/2017   Chest pain 02/2017   Dyspnea    Encephalopathy acute 05/30/2022   GERD (gastroesophageal reflux disease)    High cholesterol    History of gout    Hypertension    Macular degeneration    NSTEMI (non-ST elevated myocardial infarction)  (HCC) 02/2017   Pericardial effusion 04/2017   Pericardial tamponade     Past Surgical History:  Procedure Laterality Date   CATARACT EXTRACTION W/ INTRAOCULAR LENS  IMPLANT, BILATERAL Bilateral    CORONARY ANGIOPLASTY     CORONARY BALLOON ANGIOPLASTY N/A 03/10/2017   Procedure: Coronary Balloon Angioplasty;  Surgeon: Runell Gess, MD;  Location: [facility] INVASIVE CV LAB;  Service: Cardiovascular;  Laterality: N/A;  CFX   CORONARY CTO INTERVENTION N/A 05/14/2017   Procedure: CORONARY CTO INTERVENTION;  Surgeon: Swaziland, Peter M, MD;  Location: [facility] INVASIVE CV LAB;  Service: Cardiovascular;  Laterality: N/A;   LEFT HEART CATH AND CORONARY ANGIOGRAPHY N/A 03/10/2017   Procedure: Left Heart Cath and Coronary Angiography;  Surgeon: Runell Gess, MD;  Location: [facility] INVASIVE CV LAB;  Service: Cardiovascular;  Laterality: N/A;   PERICARDIOCENTESIS N/A 05/14/2017   Procedure: PERICARDIOCENTESIS;  Surgeon: Swaziland, Peter M, MD;  Location: [facility] INVASIVE CV LAB;  Service: Cardiovascular;  Laterality: N/A;   SUBXYPHOID PERICARDIAL WINDOW N/A 05/15/2017   Procedure: SUBXYPHOID PERICARDIAL WINDOW WITH DRAINAGE OF PERICARDIAL EFFUSION;  Surgeon: Alleen Borne, MD;  Location: [facility] OR;  Service: Thoracic;  Laterality: N/A;   ULTRASOUND GUIDANCE FOR VASCULAR ACCESS  05/14/2017   Procedure: Ultrasound Guidance For Vascular Access;  Surgeon: Swaziland, Peter M, MD;  Location: [facility] INVASIVE CV LAB;  Service: Cardiovascular;;    Current Medications: Current Meds  Medication Sig   allopurinol (ZYLOPRIM) 100 MG tablet Take 1 tablet (  100 mg total) by mouth 2 (two) times daily as needed (pain).   amLODipine (NORVASC) 5 MG tablet TAKE 1 TABLET(5 MG) BY MOUTH DAILY (Patient taking differently: Take 5 mg by mouth daily.)   aspirin EC 81 MG tablet Take 1 tablet (81 mg total) by mouth daily. Resume on 05/20/17.   Bempedoic Acid-Ezetimibe (NEXLIZET) 180-10 MG TABS Take 1 tablet by mouth daily.   ibuprofen (ADVIL) 600 MG tablet Take  1 tablet (600 mg total) by mouth every 8 (eight) hours as needed.   isosorbide mononitrate (IMDUR) 60 MG 24 hr tablet Take 1 tablet (60 mg total) by mouth daily.   losartan (COZAAR) 50 MG tablet Take 1 tablet (50 mg total) by mouth at bedtime.   Multiple Vitamins-Minerals (PRESERVISION AREDS PO) Take 1 capsule by mouth 2 (two) times daily. Unknown strenght   nitroGLYCERIN (NITROSTAT) 0.4 MG SL tablet Place 1 tablet (0.4 mg total) under the tongue every 5 (five) minutes as needed for chest pain.   pantoprazole (PROTONIX) 40 MG tablet TAKE 1 TABLET(40 MG) BY MOUTH DAILY (Patient taking differently: Take 40 mg by mouth daily.)   pravastatin (PRAVACHOL) 20 MG tablet TAKE 1 TABLET BY MOUTH DAILY (Patient taking differently: Take 20 mg by mouth daily.)   [DISCONTINUED] ezetimibe (ZETIA) 10 MG tablet TAKE 1 TABLET(10 MG) BY MOUTH DAILY (Patient taking differently: Take 10 mg by mouth daily.)     Allergies:   Patient has no known allergies.   Social History   Socioeconomic History   Marital status: Divorced    Spouse name: Not on file   Number of children: Not on file   Years of education: Not on file   Highest education level: Not on file  Occupational History   Occupation: Retired Proofreader  Tobacco Use   Smoking status: Former    Current packs/day: 3.00    Average packs/day: 3.0 packs/day for 20.0 years (60.0 ttl pk-yrs)    Types: Cigarettes   Smokeless tobacco: Former    Types: Chew   Tobacco comments:    "quit smoking in the 1980s; chewed 4-5 years after I quit smoking"  Vaping Use   Vaping status: Never Used  Substance and Sexual Activity   Alcohol use: Not Currently    Comment: 05/14/2017 "6-10 beers/day"  06/11/22 - 2016 cut down to three beers a day, has not had any alcohol in the past 6 weeks   Drug use: No   Sexual activity: Not on file  Other Topics Concern   Not on file  Social History Narrative   Not on file   Social Determinants of Health   Financial Resource Strain:  Low Risk  (06/11/2022)   Overall Financial Resource Strain (CARDIA)    Difficulty of Paying Living Expenses: Not hard at all  Food Insecurity: No Food Insecurity (06/11/2022)   Hunger Vital Sign    Worried About Running Out of Food in the Last Year: Never true    Ran Out of Food in the Last Year: Never true  Transportation Needs: No Transportation Needs (06/11/2022)   PRAPARE - Administrator, Civil Service (Medical): No    Lack of Transportation (Non-Medical): No  Physical Activity: Inactive (04/07/2023)   Exercise Vital Sign    Days of Exercise per Week: 0 days    Minutes of Exercise per Session: 0 min  Stress: No Stress Concern Present (06/11/2022)   [employer] of Occupational Health - Occupational Stress Questionnaire    Feeling of Stress :  Not at all  Social Connections: Moderately Integrated (04/07/2023)   Social Connection and Isolation Panel [NHANES]    Frequency of Communication with Friends and Family: More than three times a week    Frequency of Social Gatherings with Friends and Family: More than three times a week    Attends Religious Services: More than 4 times per year    Active Member of [employer] or Organizations: No    Attends Banker Meetings: Never    Marital Status: Married     Family History: The patient's family history includes CAD in his brother. ROS:   Please see the history of present illness.    All 14 point review of systems negative except as described per history of present illness  EKGs/Labs/Other Studies Reviewed:         Recent Labs: 10/07/2022: TSH 1.700 04/07/2023: ALT 12; BUN 10; Creatinine, Ser 1.16; Hemoglobin 12.1; Platelets 265; Potassium 5.3; Sodium 135  Recent Lipid Panel    Component Value Date/Time   CHOL 164 04/07/2023 0838   TRIG 149 04/07/2023 0838   HDL 46 04/07/2023 0838   CHOLHDL 3.6 04/07/2023 0838   CHOLHDL 3.3 03/11/2017 0214   VLDL 14 03/11/2017 0214   LDLCALC 92 04/07/2023 0838     Physical Exam:    VS:  BP 136/70 (BP Location: Left Arm, Patient Position: Sitting)   Pulse 95   Ht 5\' 11"  (1.803 m)   Wt 238 lb (108 kg)   SpO2 97%   BMI 33.19 kg/m     Wt Readings from Last 3 Encounters:  05/08/23 238 lb (108 kg)  04/07/23 236 lb (107 kg)  10/07/22 236 lb (107 kg)     GEN:  Well nourished, well developed in no acute distress HEENT: Normal NECK: No JVD; No carotid bruits LYMPHATICS: No lymphadenopathy CARDIAC: RRR, no murmurs, no rubs, no gallops RESPIRATORY:  Clear to auscultation without rales, wheezing or rhonchi  ABDOMEN: Soft, non-tender, non-distended MUSCULOSKELETAL:  No edema; No deformity  SKIN: Warm and dry LOWER EXTREMITIES: no swelling NEUROLOGIC:  Alert and oriented x 3 PSYCHIATRIC:  Normal affect   ASSESSMENT:    1. Coronary artery disease of native artery of native heart with stable angina pectoris (HCC)   2. Dyspnea on exertion   3. Mixed hyperlipidemia    PLAN:    In order of problems listed above:  Coronary disease stable from that point review denies have any chest pain tightness squeezing pressure burning chest. Dyspnea exertion only mild probably related to sedentary lifestyle. Dyslipidemia I did review K PN which show me his LDL of 92, HDL 46.  He is on Zetia and pravastatin 20 I want to increase pravastatin to 40 he refused he said he feels awful with a higher dose of medications.  Will try to [employer] and see if he can tolerate that medication to improve his overall outcomes.  I did talk to him in length about need to exercise and BL be more active he said he will try to do that   Medication Adjustments/Labs and Tests Ordered: Current medicines are reviewed at length with the patient today.  Concerns regarding medicines are outlined above.  No orders of the defined types were placed in this encounter.  Medication changes:  Meds ordered this encounter  Medications   Bempedoic Acid-Ezetimibe (NEXLIZET) 180-10 MG  TABS    Sig: Take 1 tablet by mouth daily.    Signed, Georgeanna Lea, MD, [facility] 05/08/2023 3:25 PM  [facility]

## 2023-05-08 NOTE — Patient Instructions (Addendum)
Medication Instructions:   STOP: Zetia  START: Nexlizet 1 tablet daily   Lab Work: Your physician recommends that you return for lab work in: 6 weeks ( approx October 31st) You need to have labs done when you are fasting.  You can come Monday through Friday 8:30 am to 12:00 pm and 1:15 to 4:30. You do not need to make an appointment as the order has already been placed.     Testing/Procedures: None Ordered   Follow-Up: At [facility], you and your health needs are our priority.  As part of our continuing mission to provide you with exceptional heart care, we have created designated Provider Care Teams.  These Care Teams include your primary Cardiologist (physician) and Advanced Practice Providers (APPs -  Physician Assistants and Nurse Practitioners) who all work together to provide you with the care you need, when you need it.  We recommend signing up for the patient portal called "MyChart".  Sign up information is provided on this After Visit Summary.  MyChart is used to connect with patients for Virtual Visits (Telemedicine).  Patients are able to view lab/test results, encounter notes, upcoming appointments, etc.  Non-urgent messages can be sent to your provider as well.   To learn more about what you can do with MyChart, go to [email or web address].    Your next appointment:   6 month(s)  The format for your next appointment:   In Person  Provider:   Gypsy Balsam, MD    Other Instructions NA

## 2023-05-12 ENCOUNTER — Telehealth: Payer: Self-pay

## 2023-05-12 ENCOUNTER — Other Ambulatory Visit (HOSPITAL_COMMUNITY): Payer: Self-pay

## 2023-05-12 NOTE — Telephone Encounter (Signed)
PA request has been Submitted. New Encounter created for follow up. For additional info see Pharmacy Prior Auth telephone encounter from 05/12/23.

## 2023-05-12 NOTE — Telephone Encounter (Signed)
Please submit a PA for patient's Nexlizet 180-10 mg.

## 2023-05-12 NOTE — Telephone Encounter (Addendum)
Pharmacy Patient Advocate Encounter   Received notification from Physician's Office that prior authorization for NEXLIZET is required/requested.   Insurance verification completed.   The patient is insured through [facility] .   Per test claim: PA required; PA submitted to [facility] via CoverMyMeds Key/confirmation #/EOC [facility] Status is pending

## 2023-05-14 ENCOUNTER — Other Ambulatory Visit (HOSPITAL_COMMUNITY): Payer: Self-pay

## 2023-05-16 ENCOUNTER — Telehealth: Payer: Self-pay | Admitting: Cardiology

## 2023-05-16 ENCOUNTER — Other Ambulatory Visit (HOSPITAL_COMMUNITY): Payer: Self-pay

## 2023-05-16 NOTE — Telephone Encounter (Signed)
Patient is returning call. Please advise? 

## 2023-05-16 NOTE — Telephone Encounter (Signed)
Patient says Dr. Bing Matter prescribed him Nexlizet and he cannot take this medicine. He says he feels like "its killing him", since taking it his feet all the way up to his knees hurt, swell, and feel numb. Patient would like Dr. Charm Rings advice. Patient says he was supposed to have bloodwork done after 6 weeks post starting medication. He would like to know if he still needs to do that.  Best number [phone]

## 2023-05-16 NOTE — Telephone Encounter (Signed)
Spoke with pt, aware to stop nexlizet. Aware he does not need to repeat his blood work right now. Will make dr Bing Matter aware.

## 2023-05-16 NOTE — Telephone Encounter (Signed)
Pharmacy Patient Advocate Encounter  Received notification from [facility] that Prior Authorization for NEXLIZET has been APPROVED from 05/15/23 to 11/12/23. Ran test claim, Copay is $47. This test claim was processed through [facility]- copay amounts may vary at other pharmacies due to pharmacy/plan contracts, or as the patient moves through the different stages of their insurance plan.

## 2023-05-16 NOTE — Telephone Encounter (Signed)
Unable to reach pt or leave a message  

## 2023-05-30 ENCOUNTER — Encounter: Payer: Self-pay | Admitting: Physician Assistant

## 2023-05-30 ENCOUNTER — Ambulatory Visit (INDEPENDENT_AMBULATORY_CARE_PROVIDER_SITE_OTHER): Payer: Medicare Other | Admitting: Physician Assistant

## 2023-05-30 VITALS — BP 124/58 | HR 74 | Temp 97.7°F | Resp 14 | Ht 71.0 in | Wt 240.0 lb

## 2023-05-30 DIAGNOSIS — M1A00X0 Idiopathic chronic gout, unspecified site, without tophus (tophi): Secondary | ICD-10-CM

## 2023-05-30 MED ORDER — INDOMETHACIN 50 MG PO CAPS
50.0000 mg | ORAL_CAPSULE | Freq: Three times a day (TID) | ORAL | 1 refills | Status: DC
Start: 2023-05-30 — End: 2023-06-06

## 2023-05-30 MED ORDER — TRIAMCINOLONE ACETONIDE 40 MG/ML IJ SUSP
80.0000 mg | Freq: Once | INTRAMUSCULAR | Status: AC
Start: 2023-05-30 — End: 2023-05-30
  Administered 2023-05-30: 80 mg via INTRAMUSCULAR

## 2023-05-30 NOTE — Assessment & Plan Note (Signed)
Prescribed Indomethacin 50mg  to help with pain Given Kenalog 40mg  injection in office for immediate relief Will send colchicine if uric acid levels are elevated.

## 2023-05-30 NOTE — Progress Notes (Signed)
Subjective:  Patient ID: Joseph Mercado, male    DOB: April 27, 1946  Age: 77 y.o. MRN: [id number]  Chief Complaint  Patient presents with   Leg Pain   Gout    HPI   Patient saw cardiologist on 05/08/2023 and he changed his medication for cholesterol. He stopped zetia and started on nexlizet. He stopped the medicine because he has severe pain on both ankles and lower legs. Denies any history of fever, chills, or recent injury to the legs that could lead to infection.      06/11/2022    9:23 AM 04/03/2022    8:01 AM 03/27/2021    8:00 AM 03/27/2020    9:23 AM  Depression screen PHQ 2/9  Decreased Interest 0 0 0 0  Down, Depressed, Hopeless 0 0 0 0  PHQ - 2 Score 0 0 0 0        06/11/2022    9:26 AM  Fall Risk   Falls in the past year? 0  Number falls in past yr: 0  Injury with Fall? 0  Risk for fall due to : No Fall Risks  Follow up Falls evaluation completed;Falls prevention discussed    Patient Care Team: Blane Ohara, MD as PCP - General (Internal Medicine) Georgeanna Lea, MD as Consulting Physician (Cardiology) Victorino Sparrow, MD as Consulting Physician (Vascular Surgery) Cherly Beach, MD as Referring Physician (Family Medicine)   Review of Systems  Constitutional:  Negative for chills, fatigue, fever and unexpected weight change.  HENT:  Negative for congestion, ear pain, sinus pain and sore throat.   Respiratory:  Negative for cough and shortness of breath.   Cardiovascular:  Negative for chest pain and palpitations.  Gastrointestinal:  Negative for abdominal pain, blood in stool, constipation, diarrhea, nausea and vomiting.  Endocrine: Negative for polydipsia.  Genitourinary:  Negative for dysuria.  Musculoskeletal:  Positive for arthralgias and myalgias. Negative for back pain.       Both feet pain with radiation to lower legs.  Skin:  Negative for rash.  Neurological:  Negative for headaches.    Current Outpatient Medications on File Prior to Visit   Medication Sig Dispense Refill   allopurinol (ZYLOPRIM) 100 MG tablet Take 1 tablet (100 mg total) by mouth 2 (two) times daily as needed (pain). 180 tablet 1   amLODipine (NORVASC) 5 MG tablet TAKE 1 TABLET(5 MG) BY MOUTH DAILY (Patient taking differently: Take 5 mg by mouth daily.) 90 tablet 1   aspirin EC 81 MG tablet Take 1 tablet (81 mg total) by mouth daily. Resume on 05/20/17. 30 tablet 12   ezetimibe (ZETIA) 10 MG tablet Take 10 mg by mouth daily.     ibuprofen (ADVIL) 600 MG tablet Take 1 tablet (600 mg total) by mouth every 8 (eight) hours as needed. 90 tablet 1   isosorbide mononitrate (IMDUR) 60 MG 24 hr tablet Take 1 tablet (60 mg total) by mouth daily. 90 tablet 3   losartan (COZAAR) 50 MG tablet Take 1 tablet (50 mg total) by mouth at bedtime. 90 tablet 0   Multiple Vitamins-Minerals (PRESERVISION AREDS PO) Take 1 capsule by mouth 2 (two) times daily. Unknown strenght     nitroGLYCERIN (NITROSTAT) 0.4 MG SL tablet Place 1 tablet (0.4 mg total) under the tongue every 5 (five) minutes as needed for chest pain. 25 tablet 6   pantoprazole (PROTONIX) 40 MG tablet TAKE 1 TABLET(40 MG) BY MOUTH DAILY (Patient taking differently: Take 40 mg by mouth  daily.) 90 tablet 1   pravastatin (PRAVACHOL) 20 MG tablet TAKE 1 TABLET BY MOUTH DAILY (Patient taking differently: Take 20 mg by mouth daily.) 90 tablet 0   No current facility-administered medications on file prior to visit.   Past Medical History:  Diagnosis Date   Angina pectoris (HCC)    CAD (coronary artery disease)    a. 02/2017: NSTEMI with cath showing 100% Prox Cx stenosis with 95% Mid Cx stenosis and 45% mid-LAD stenosis. Unsuccessful attempt at crossing an occluded mid nondominant circumflex --> plan for staged PCI in coming weeks   Cardiac/pericardial tamponade 05/14/2017   Chest pain 02/2017   Dyspnea    Encephalopathy acute 05/30/2022   GERD (gastroesophageal reflux disease)    High cholesterol    History of gout     Hypertension    Macular degeneration    NSTEMI (non-ST elevated myocardial infarction) (HCC) 02/2017   Pericardial effusion 04/2017   Pericardial tamponade    Past Surgical History:  Procedure Laterality Date   CATARACT EXTRACTION W/ INTRAOCULAR LENS  IMPLANT, BILATERAL Bilateral    CORONARY ANGIOPLASTY     CORONARY BALLOON ANGIOPLASTY N/A 03/10/2017   Procedure: Coronary Balloon Angioplasty;  Surgeon: Runell Gess, MD;  Location: [facility] INVASIVE CV LAB;  Service: Cardiovascular;  Laterality: N/A;  CFX   CORONARY CTO INTERVENTION N/A 05/14/2017   Procedure: CORONARY CTO INTERVENTION;  Surgeon: Swaziland, Peter M, MD;  Location: [facility] INVASIVE CV LAB;  Service: Cardiovascular;  Laterality: N/A;   LEFT HEART CATH AND CORONARY ANGIOGRAPHY N/A 03/10/2017   Procedure: Left Heart Cath and Coronary Angiography;  Surgeon: Runell Gess, MD;  Location: [facility] INVASIVE CV LAB;  Service: Cardiovascular;  Laterality: N/A;   PERICARDIOCENTESIS N/A 05/14/2017   Procedure: PERICARDIOCENTESIS;  Surgeon: Swaziland, Peter M, MD;  Location: [facility] INVASIVE CV LAB;  Service: Cardiovascular;  Laterality: N/A;   SUBXYPHOID PERICARDIAL WINDOW N/A 05/15/2017   Procedure: SUBXYPHOID PERICARDIAL WINDOW WITH DRAINAGE OF PERICARDIAL EFFUSION;  Surgeon: Alleen Borne, MD;  Location: [facility] OR;  Service: Thoracic;  Laterality: N/A;   ULTRASOUND GUIDANCE FOR VASCULAR ACCESS  05/14/2017   Procedure: Ultrasound Guidance For Vascular Access;  Surgeon: Swaziland, Peter M, MD;  Location: [facility] INVASIVE CV LAB;  Service: Cardiovascular;;    Family History  Problem Relation Age of Onset   CAD Brother        CABG in his 65's   Social History   Socioeconomic History   Marital status: Divorced    Spouse name: Not on file   Number of children: Not on file   Years of education: Not on file   Highest education level: Not on file  Occupational History   Occupation: Retired Proofreader  Tobacco Use   Smoking status: Former    Current packs/day: 3.00     Average packs/day: 3.0 packs/day for 20.0 years (60.0 ttl pk-yrs)    Types: Cigarettes   Smokeless tobacco: Former    Types: Chew   Tobacco comments:    "quit smoking in the 1980s; chewed 4-5 years after I quit smoking"  Vaping Use   Vaping status: Never Used  Substance and Sexual Activity   Alcohol use: Not Currently    Comment: 05/14/2017 "6-10 beers/day"  06/11/22 - 2016 cut down to three beers a day, has not had any alcohol in the past 6 weeks   Drug use: No   Sexual activity: Not on file  Other Topics Concern   Not on file  Social History Narrative  Not on file   Social Determinants of Health   Financial Resource Strain: Low Risk  (06/11/2022)   Overall Financial Resource Strain (CARDIA)    Difficulty of Paying Living Expenses: Not hard at all  Food Insecurity: No Food Insecurity (06/11/2022)   Hunger Vital Sign    Worried About Running Out of Food in the Last Year: Never true    Ran Out of Food in the Last Year: Never true  Transportation Needs: No Transportation Needs (06/11/2022)   PRAPARE - Administrator, Civil Service (Medical): No    Lack of Transportation (Non-Medical): No  Physical Activity: Inactive (04/07/2023)   Exercise Vital Sign    Days of Exercise per Week: 0 days    Minutes of Exercise per Session: 0 min  Stress: No Stress Concern Present (06/11/2022)   [employer] of Occupational Health - Occupational Stress Questionnaire    Feeling of Stress : Not at all  Social Connections: Moderately Integrated (04/07/2023)   Social Connection and Isolation Panel [NHANES]    Frequency of Communication with Friends and Family: More than three times a week    Frequency of Social Gatherings with Friends and Family: More than three times a week    Attends Religious Services: More than 4 times per year    Active Member of [employer] or Organizations: No    Attends Engineer, structural: Never    Marital Status: Married    Objective:  BP (!)  124/58   Pulse 74   Temp 97.7 F (36.5 C)   Resp 14   Ht 5\' 11"  (1.803 m)   Wt 240 lb (108.9 kg)   SpO2 98%   BMI 33.47 kg/m      05/30/2023   10:42 AM 05/08/2023    3:00 PM 04/07/2023    7:57 AM  BP/Weight  Systolic BP 124 136 122  Diastolic BP 58 70 60  Wt. (Lbs) 240 238 236  BMI 33.47 kg/m2 33.19 kg/m2 32.92 kg/m2    Physical Exam Vitals reviewed.  Constitutional:      Appearance: Normal appearance.  Cardiovascular:     Rate and Rhythm: Normal rate and regular rhythm.     Heart sounds: Normal heart sounds.  Pulmonary:     Effort: Pulmonary effort is normal.     Breath sounds: Normal breath sounds.  Abdominal:     General: Bowel sounds are normal.     Palpations: Abdomen is soft.     Tenderness: There is no abdominal tenderness.  Musculoskeletal:     Right knee: No erythema. No tenderness.     Left knee: No erythema. Tenderness present.     Right ankle: Swelling present. Tenderness present.     Left ankle: Swelling present. Tenderness present.     Right foot: Tenderness present.     Left foot: Tenderness present.  Neurological:     Mental Status: He is alert and oriented to person, place, and time.  Psychiatric:        Mood and Affect: Mood normal.        Behavior: Behavior normal.     Diabetic Foot Exam - Simple   No data filed      Lab Results  Component Value Date   WBC 9.7 04/07/2023   HGB 12.1 (L) 04/07/2023   HCT 40.2 04/07/2023   PLT 265 04/07/2023   GLUCOSE 89 04/07/2023   CHOL 164 04/07/2023   TRIG 149 04/07/2023   HDL 46 04/07/2023  LDLCALC 92 04/07/2023   ALT 12 04/07/2023   AST 19 04/07/2023   NA 135 04/07/2023   K 5.3 (H) 04/07/2023   CL 96 04/07/2023   CREATININE 1.16 04/07/2023   BUN 10 04/07/2023   CO2 23 04/07/2023   TSH 1.700 10/07/2022   INR 1.11 05/20/2017   HGBA1C 4.7 (L) 05/20/2017      Assessment & Plan:    Chronic gouty arthritis Assessment & Plan: Prescribed Indomethacin 50mg  to help with pain Given  Kenalog 40mg  injection in office for immediate relief Will send colchicine if uric acid levels are elevated.   Orders: -     Uric acid -     CBC with Differential/Platelet -     Indomethacin; Take 1 capsule (50 mg total) by mouth 3 (three) times daily with meals.  Dispense: 60 capsule; Refill: 1 -     Triamcinolone Acetonide     Meds ordered this encounter  Medications   indomethacin (INDOCIN) 50 MG capsule    Sig: Take 1 capsule (50 mg total) by mouth 3 (three) times daily with meals.    Dispense:  60 capsule    Refill:  1   triamcinolone acetonide (KENALOG-40) injection 80 mg    Orders Placed This Encounter  Procedures   Uric acid   CBC with Differential/Platelet     Follow-up: No follow-ups on file.   I,Marla I Leal-Borjas,acting as a scribe for [employer], PA.,have documented all relevant documentation on the behalf of Langley Gauss, PA,as directed by  Langley Gauss, PA while in the presence of Langley Gauss, [place].   An After Visit Summary was printed and given to the patient.  Langley Gauss, [place] [facility] [phone]

## 2023-05-31 ENCOUNTER — Other Ambulatory Visit: Payer: Self-pay | Admitting: Family Medicine

## 2023-05-31 DIAGNOSIS — I10 Essential (primary) hypertension: Secondary | ICD-10-CM

## 2023-05-31 LAB — CBC WITH DIFFERENTIAL/PLATELET
Basophils Absolute: 0.1 10*3/uL (ref 0.0–0.2)
Basos: 1 %
EOS (ABSOLUTE): 0.2 10*3/uL (ref 0.0–0.4)
Eos: 2 %
Hematocrit: 41.3 % (ref 37.5–51.0)
Hemoglobin: 12.4 g/dL — ABNORMAL LOW (ref 13.0–17.7)
Immature Grans (Abs): 0 10*3/uL (ref 0.0–0.1)
Immature Granulocytes: 0 %
Lymphocytes Absolute: 2.3 10*3/uL (ref 0.7–3.1)
Lymphs: 24 %
MCH: 24.5 pg — ABNORMAL LOW (ref 26.6–33.0)
MCHC: 30 g/dL — ABNORMAL LOW (ref 31.5–35.7)
MCV: 82 fL (ref 79–97)
Monocytes Absolute: 0.7 10*3/uL (ref 0.1–0.9)
Monocytes: 8 %
Neutrophils Absolute: 6.2 10*3/uL (ref 1.4–7.0)
Neutrophils: 65 %
Platelets: 283 10*3/uL (ref 150–450)
RBC: 5.06 x10E6/uL (ref 4.14–5.80)
RDW: 15 % (ref 11.6–15.4)
WBC: 9.4 10*3/uL (ref 3.4–10.8)

## 2023-05-31 LAB — URIC ACID: Uric Acid: 4.7 mg/dL (ref 3.8–8.4)

## 2023-06-03 ENCOUNTER — Telehealth: Payer: Self-pay

## 2023-06-03 NOTE — Telephone Encounter (Signed)
Patient called about his lab work, made him aware once provider view his labs he will get a call or a my-chart message from provider about his labs results.

## 2023-06-06 ENCOUNTER — Other Ambulatory Visit: Payer: Self-pay | Admitting: Physician Assistant

## 2023-06-06 ENCOUNTER — Telehealth: Payer: Self-pay

## 2023-06-06 DIAGNOSIS — M1A00X0 Idiopathic chronic gout, unspecified site, without tophus (tophi): Secondary | ICD-10-CM

## 2023-06-06 MED ORDER — INDOMETHACIN 50 MG PO CAPS
50.0000 mg | ORAL_CAPSULE | Freq: Four times a day (QID) | ORAL | 1 refills | Status: DC | PRN
Start: 2023-06-06 — End: 2023-06-25

## 2023-06-06 NOTE — Telephone Encounter (Signed)
Patient came by the office and states that the Indomethacin is not helping him with his feet and leg pain that he has been having. He is wanting to know if he can go to taking 2 tablets daily instead of 3? He doesn't know what is going to help. Patient uses [facility] in [place] in case something else is sent in for patient.

## 2023-06-17 ENCOUNTER — Ambulatory Visit (INDEPENDENT_AMBULATORY_CARE_PROVIDER_SITE_OTHER): Payer: Medicare Other

## 2023-06-17 VITALS — BP 130/66 | HR 99 | Temp 98.3°F | Resp 16 | Ht 71.0 in | Wt 237.6 lb

## 2023-06-17 DIAGNOSIS — Z0000 Encounter for general adult medical examination without abnormal findings: Secondary | ICD-10-CM

## 2023-06-17 DIAGNOSIS — Z23 Encounter for immunization: Secondary | ICD-10-CM | POA: Diagnosis not present

## 2023-06-17 HISTORY — DX: Encounter for general adult medical examination without abnormal findings: Z00.00

## 2023-06-17 NOTE — Progress Notes (Signed)
Annual Wellness Visit     Patient: Joseph Mercado, Male    DOB: August 31, 1945, 77 y.o.   MRN: [id number]  Subjective  Chief Complaint  Patient presents with   Annual Exam    Joseph Mercado is a 77 y.o. male who presents today for his Annual Wellness Visit. He reports consuming a general diet. Home exercise routine includes treadmill and exercise peddle for 30 mins daily. He generally feels fairly well. He reports sleeping well. He does not have additional problems to discuss today.   HPI  Vision:Within last year, Dental: No current dental problems and No regular dental care , and PSA: Agrees to PSA testing  Has dentures both jaws  Patient Active Problem List   Diagnosis Date Noted   Medicare annual wellness visit, subsequent 06/17/2023   Lumbar pain 04/08/2023   Screening for lung cancer 04/08/2023   Coronary artery disease of native artery of native heart with stable angina pectoris (HCC) 10/07/2022   Impaired balance as late effect of cerebrovascular accident (CVA) 10/07/2022   Late effect of cerebrovascular accident (CVA) 10/04/2022   Macular degeneration 10/03/2022   Right-sided extracranial carotid artery stenosis 05/30/2022   Cognitive deficit following cerebrovascular accident (CVA) 05/30/2022   GERD (gastroesophageal reflux disease) 08/27/2021   Morbid obesity (HCC) 03/27/2021   Senile purpura (HCC) 03/27/2021   Impotence 10/10/2019   Obstructive chronic bronchitis without exacerbation (HCC) 10/10/2019   Chronic gouty arthritis 10/10/2019   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   Hypertensive heart disease 03/11/2017   Mixed hyperlipidemia 03/11/2017   Dyspnea on exertion 02/05/2017   Social History   Tobacco Use   Smoking status: Former    Current packs/day: 3.00    Average packs/day: 3.0 packs/day for 20.0 years (60.0 ttl pk-yrs)    Types: Cigarettes   Smokeless tobacco: Former    Types: Chew   Tobacco comments:    "quit smoking  in the 1980s; chewed 4-5 years after I quit smoking"  Vaping Use   Vaping status: Never Used  Substance Use Topics   Alcohol use: Yes    Alcohol/week: 1.0 standard drink of alcohol    Types: 1 Cans of beer per week    Comment: 05/14/2017 "6-10 beers/day"  06/11/22 - 2016 cut down to three beers a day, has not had any alcohol in the past 6 weeks   Drug use: No   Family Status  Relation Name Status   Brother  (Not Specified)   Mother  Deceased   Father  Deceased   MGM  Deceased   MGF  Deceased   PGM  Deceased   PGF  Deceased  No partnership data on file      Medications: Outpatient Medications Prior to Visit  Medication Sig   allopurinol (ZYLOPRIM) 100 MG tablet Take 1 tablet (100 mg total) by mouth 2 (two) times daily as needed (pain).   amLODipine (NORVASC) 5 MG tablet TAKE 1 TABLET(5 MG) BY MOUTH DAILY   aspirin EC 81 MG tablet Take 1 tablet (81 mg total) by mouth daily. Resume on 05/20/17.   ezetimibe (ZETIA) 10 MG tablet Take 10 mg by mouth daily.   ibuprofen (ADVIL) 600 MG tablet Take 1 tablet (600 mg total) by mouth every 8 (eight) hours as needed.   indomethacin (INDOCIN) 50 MG capsule Take 1 capsule (50 mg total) by mouth 4 (four) times daily as needed.   isosorbide mononitrate (IMDUR) 60 MG 24 hr tablet Take 1 tablet (60 mg  total) by mouth daily.   losartan (COZAAR) 50 MG tablet Take 1 tablet (50 mg total) by mouth at bedtime.   nitroGLYCERIN (NITROSTAT) 0.4 MG SL tablet Place 1 tablet (0.4 mg total) under the tongue every 5 (five) minutes as needed for chest pain.   pantoprazole (PROTONIX) 40 MG tablet TAKE 1 TABLET(40 MG) BY MOUTH DAILY (Patient taking differently: Take 40 mg by mouth daily.)   pravastatin (PRAVACHOL) 20 MG tablet TAKE 1 TABLET BY MOUTH DAILY (Patient taking differently: Take 20 mg by mouth daily.)   [DISCONTINUED] Multiple Vitamins-Minerals (PRESERVISION AREDS PO) Take 1 capsule by mouth 2 (two) times daily. Unknown strenght   No facility-administered  medications prior to visit.    Allergies  Allergen Reactions   Nexlizet [Bempedoic Acid-Ezetimibe] Other (See Comments)    Pain in legs from toes to knees   Prednisone     Hearing loss that did resolve    Patient Care Team: Blane Ohara, MD as PCP - General (Internal Medicine) Georgeanna Lea, MD as Consulting Physician (Cardiology) Victorino Sparrow, MD as Consulting Physician (Vascular Surgery) Cherly Beach, MD as Referring Physician (Family Medicine)  ROS      Objective  BP (!) 156/68 (BP Location: Left Arm, Patient Position: Sitting, Cuff Size: Large)   Pulse 99   Temp 98.3 F (36.8 C)   Resp 16   Ht 5\' 11"  (1.803 m)   Wt 237 lb 9.6 oz (107.8 kg)   SpO2 99%   BMI 33.14 kg/m  BP Readings from Last 3 Encounters:  06/17/23 (!) 156/68  05/30/23 (!) 124/58  05/08/23 136/70   Wt Readings from Last 3 Encounters:  06/17/23 237 lb 9.6 oz (107.8 kg)  05/30/23 240 lb (108.9 kg)  05/08/23 238 lb (108 kg)      Physical Exam   Lives alone. Does his own cooking and cleaning.   Most recent functional status assessment:     No data to display         Most recent fall risk assessment:    06/17/2023   10:07 AM  Fall Risk   Falls in the past year? 0  Number falls in past yr: 0  Injury with Fall? 0  Risk for fall due to : No Fall Risks  Follow up Falls evaluation completed    Most recent depression screenings:    06/17/2023   10:06 AM 06/11/2022    9:23 AM  PHQ 2/9 Scores  PHQ - 2 Score 0 0  PHQ- 9 Score 0    Most recent cognitive screening:    06/17/2023   10:11 AM  6CIT Screen  What Year? 0 points  What month? 0 points  What time? 0 points  Count back from 20 0 points  Months in reverse 0 points  Repeat phrase 0 points  Total Score 0 points   Most recent Audit-C alcohol use screening    06/17/2023   10:09 AM  Alcohol Use Disorder Test (AUDIT)  1. How often do you have a drink containing alcohol? 1  2. How many drinks containing  alcohol do you have on a typical day when you are drinking? 0  3. How often do you have six or more drinks on one occasion? 1  AUDIT-C Score 2   A score of 3 or more in women, and 4 or more in men indicates increased risk for alcohol abuse, EXCEPT if all of the points are from question 1   Vision/Hearing Screen:  No results found.     No results found for any visits on 06/17/23.    Assessment & Plan   Annual wellness visit done today including the all of the following: Reviewed patient's Family Medical History Reviewed and updated list of patient's medical providers Assessment of cognitive impairment was done Assessed patient's functional ability Established a written schedule for health screening services Health Risk Assessent Completed and Reviewed  Exercise Activities and Dietary recommendations  Goals   None     Immunization History  Administered Date(s) Administered   Fluad Quad(high Dose 65+) 05/22/2020, 05/31/2021, 05/22/2022   Influenza, High Dose Seasonal PF 05/15/2017   Moderna Covid-19 Vaccine Bivalent Booster 28yrs & up 05/31/2021   Moderna Sars-Covid-2 Vaccination 10/02/2019, 10/30/2019, 06/13/2020   Pneumococcal Conjugate-13 03/16/2014   Pneumococcal Polysaccharide-23 07/12/2015   Tdap 07/02/2022   Zoster Recombinant(Shingrix) 04/03/2022, 06/12/2022    Health Maintenance  Topic Date Due   Lung Cancer Screening  Never done   INFLUENZA VACCINE  03/20/2023   COVID-19 Vaccine (5 - 2023-24 season) 04/20/2023   Medicare Annual Wellness (AWV)  06/12/2023   DTaP/Tdap/Td (2 - Td or Tdap) 07/02/2032   Pneumonia Vaccine 62+ Years old  Completed   Zoster Vaccines- Shingrix  Completed   HPV VACCINES  Aged Out   Colonoscopy  Discontinued   Hepatitis C Screening  Discontinued     Discussed health benefits of physical activity, and encouraged him to engage in regular exercise appropriate for his age and condition.    Problem List Items Addressed This Visit    None   No follow-ups on file.     Danny Lawless, CMA

## 2023-06-17 NOTE — Patient Instructions (Signed)
  Joseph Mercado , Thank you for taking time to come for your Medicare Wellness Visit. I appreciate your ongoing commitment to your health goals. Please review the following plan we discussed and let me know if I can assist you in the future.   FLU SHOT TODAY PLEASE CHECK YOUR HOME BLOOD PRESSURES FREQUENTLY.   These are the goals we discussed:  Goals   None     This is a list of the screening recommended for you and due dates:  Health Maintenance  Topic Date Due   Screening for Lung Cancer  Never done   Flu Shot  03/20/2023   COVID-19 Vaccine (5 - 2023-24 season) 04/20/2023   Medicare Annual Wellness Visit  06/16/2024   DTaP/Tdap/Td vaccine (2 - Td or Tdap) 07/02/2032   Pneumonia Vaccine  Completed   Zoster (Shingles) Vaccine  Completed   HPV Vaccine  Aged Out   Colon Cancer Screening  Discontinued   Hepatitis C Screening  Discontinued

## 2023-06-23 ENCOUNTER — Other Ambulatory Visit: Payer: Self-pay | Admitting: Family Medicine

## 2023-06-25 ENCOUNTER — Ambulatory Visit (INDEPENDENT_AMBULATORY_CARE_PROVIDER_SITE_OTHER): Payer: Medicare Other | Admitting: Family Medicine

## 2023-06-25 ENCOUNTER — Encounter: Payer: Self-pay | Admitting: Family Medicine

## 2023-06-25 VITALS — BP 130/64 | HR 98 | Temp 98.7°F | Resp 16 | Ht 71.0 in | Wt 232.2 lb

## 2023-06-25 DIAGNOSIS — R202 Paresthesia of skin: Secondary | ICD-10-CM

## 2023-06-25 DIAGNOSIS — H539 Unspecified visual disturbance: Secondary | ICD-10-CM

## 2023-06-25 DIAGNOSIS — R200 Anesthesia of skin: Secondary | ICD-10-CM

## 2023-06-25 DIAGNOSIS — M7918 Myalgia, other site: Secondary | ICD-10-CM

## 2023-06-25 DIAGNOSIS — E782 Mixed hyperlipidemia: Secondary | ICD-10-CM

## 2023-06-25 HISTORY — DX: Unspecified visual disturbance: H53.9

## 2023-06-25 HISTORY — DX: Myalgia, other site: M79.18

## 2023-06-25 HISTORY — DX: Anesthesia of skin: R20.0

## 2023-06-25 HISTORY — DX: Anesthesia of skin: R20.2

## 2023-06-25 MED ORDER — KETOROLAC TROMETHAMINE 60 MG/2ML IM SOLN: 60.0000 mg | Freq: Once | INTRAMUSCULAR | Status: AC

## 2023-06-25 NOTE — Assessment & Plan Note (Addendum)
Well controlled Patient was recently given nexlizet 180mg /10 mg on 05/08/23 in lieu of zetia - side effects included myalgias, numbness and pain in BLE. First workup was for gout flare. Uric acid level was normal, indocin not helping Discontinue Pravastatin 20 mg for 4 weeks Continue Zetia 10 mg by mouth once daily Labs drawn Await labs/testing for assessment and recommendations

## 2023-06-25 NOTE — Progress Notes (Signed)
Acute Office Visit  Subjective:    Patient ID: Joseph Mercado, male    DOB: 07-Mar-1946, 77 y.o.   MRN: [id number]  Chief Complaint  Patient presents with   Leg Pain    HPI: Patient is in today for leg pain. Patient started taking Nexlizet 180 mg/10 mg on 05/08/23 and on the 3rd day he started developing pain in bilateral feet, he stopped taking the medication on day 6. He saw Huston Foley 05/30/23 with symptoms of gout and was prescribed indomethacin 50 mg by mouth THREE TIMES A DAY for 30 days and was given a steroid shot. He has not noticed any relief in his pain. He has also taken ibuprofen 400 mg by mouth as needed. He is here today to see what he can do for the pain because now the pain has moved to the upper thighs on both legs. Patient is having trouble sleeping. Patient is also having vision changes and floaters. Patient is having trouble walking.   Past Medical History:  Diagnosis Date   Angina pectoris (HCC)    CAD (coronary artery disease)    a. 02/2017: NSTEMI with cath showing 100% Prox Cx stenosis with 95% Mid Cx stenosis and 45% mid-LAD stenosis. Unsuccessful attempt at crossing an occluded mid nondominant circumflex --> plan for staged PCI in coming weeks   Cardiac/pericardial tamponade 05/14/2017   Chest pain 02/2017   Dyspnea    Encephalopathy acute 05/30/2022   GERD (gastroesophageal reflux disease)    High cholesterol    History of gout    Hypertension    Macular degeneration    NSTEMI (non-ST elevated myocardial infarction) (HCC) 02/2017   Pericardial effusion 04/2017   Pericardial tamponade     Past Surgical History:  Procedure Laterality Date   CATARACT EXTRACTION W/ INTRAOCULAR LENS  IMPLANT, BILATERAL Bilateral    CORONARY ANGIOPLASTY     CORONARY BALLOON ANGIOPLASTY N/A 03/10/2017   Procedure: Coronary Balloon Angioplasty;  Surgeon: Runell Gess, MD;  Location: [facility] INVASIVE CV LAB;  Service: Cardiovascular;  Laterality: N/A;  CFX   CORONARY CTO INTERVENTION  N/A 05/14/2017   Procedure: CORONARY CTO INTERVENTION;  Surgeon: Swaziland, Peter M, MD;  Location: [facility] INVASIVE CV LAB;  Service: Cardiovascular;  Laterality: N/A;   LEFT HEART CATH AND CORONARY ANGIOGRAPHY N/A 03/10/2017   Procedure: Left Heart Cath and Coronary Angiography;  Surgeon: Runell Gess, MD;  Location: [facility] INVASIVE CV LAB;  Service: Cardiovascular;  Laterality: N/A;   PERICARDIOCENTESIS N/A 05/14/2017   Procedure: PERICARDIOCENTESIS;  Surgeon: Swaziland, Peter M, MD;  Location: [facility] INVASIVE CV LAB;  Service: Cardiovascular;  Laterality: N/A;   SUBXYPHOID PERICARDIAL WINDOW N/A 05/15/2017   Procedure: SUBXYPHOID PERICARDIAL WINDOW WITH DRAINAGE OF PERICARDIAL EFFUSION;  Surgeon: Alleen Borne, MD;  Location: [facility] OR;  Service: Thoracic;  Laterality: N/A;   ULTRASOUND GUIDANCE FOR VASCULAR ACCESS  05/14/2017   Procedure: Ultrasound Guidance For Vascular Access;  Surgeon: Swaziland, Peter M, MD;  Location: [facility] INVASIVE CV LAB;  Service: Cardiovascular;;    Family History  Problem Relation Age of Onset   CAD Brother        CABG in his 51's    Social History   Socioeconomic History   Marital status: Divorced    Spouse name: Not on file   Number of children: Not on file   Years of education: Not on file   Highest education level: Not on file  Occupational History   Occupation: Retired Proofreader  Tobacco Use  Smoking status: Former    Current packs/day: 3.00    Average packs/day: 3.0 packs/day for 20.0 years (60.0 ttl pk-yrs)    Types: Cigarettes   Smokeless tobacco: Former    Types: Chew   Tobacco comments:    "quit smoking in the 1980s; chewed 4-5 years after I quit smoking"  Vaping Use   Vaping status: Never Used  Substance and Sexual Activity   Alcohol use: Yes    Alcohol/week: 1.0 standard drink of alcohol    Types: 1 Cans of beer per week    Comment: 05/14/2017 "6-10 beers/day"  06/11/22 - 2016 cut down to three beers a day, has not had any alcohol in the past 6 weeks   Drug  use: No   Sexual activity: Not on file  Other Topics Concern   Not on file  Social History Narrative   Not on file   Social Determinants of Health   Financial Resource Strain: Low Risk  (06/17/2023)   Overall Financial Resource Strain (CARDIA)    Difficulty of Paying Living Expenses: Not hard at all  Food Insecurity: No Food Insecurity (06/17/2023)   Hunger Vital Sign    Worried About Running Out of Food in the Last Year: Never true    Ran Out of Food in the Last Year: Never true  Transportation Needs: No Transportation Needs (06/17/2023)   PRAPARE - Administrator, Civil Service (Medical): No    Lack of Transportation (Non-Medical): No  Physical Activity: Sufficiently Active (06/17/2023)   Exercise Vital Sign    Days of Exercise per Week: 7 days    Minutes of Exercise per Session: 60 min  Recent Concern: Physical Activity - Inactive (04/07/2023)   Exercise Vital Sign    Days of Exercise per Week: 0 days    Minutes of Exercise per Session: 0 min  Stress: No Stress Concern Present (06/17/2023)   [employer] of Occupational Health - Occupational Stress Questionnaire    Feeling of Stress : Not at all  Social Connections: Moderately Integrated (04/07/2023)   Social Connection and Isolation Panel [NHANES]    Frequency of Communication with Friends and Family: More than three times a week    Frequency of Social Gatherings with Friends and Family: More than three times a week    Attends Religious Services: More than 4 times per year    Active Member of [employer] or Organizations: No    Attends Banker Meetings: Never    Marital Status: Married  Catering manager Violence: Not At Risk (06/17/2023)   Humiliation, Afraid, Rape, and Kick questionnaire    Fear of Current or Ex-Partner: No    Emotionally Abused: No    Physically Abused: No    Sexually Abused: No    Outpatient Medications Prior to Visit  Medication Sig Dispense Refill   allopurinol (ZYLOPRIM)  100 MG tablet Take 1 tablet (100 mg total) by mouth 2 (two) times daily as needed (pain). 180 tablet 1   amLODipine (NORVASC) 5 MG tablet TAKE 1 TABLET(5 MG) BY MOUTH DAILY 90 tablet 1   aspirin EC 81 MG tablet Take 1 tablet (81 mg total) by mouth daily. Resume on 05/20/17. 30 tablet 12   ezetimibe (ZETIA) 10 MG tablet Take 10 mg by mouth daily.     ibuprofen (ADVIL) 600 MG tablet Take 1 tablet (600 mg total) by mouth every 8 (eight) hours as needed. 90 tablet 1   isosorbide mononitrate (IMDUR) 60 MG 24 hr  tablet Take 1 tablet (60 mg total) by mouth daily. 90 tablet 3   losartan (COZAAR) 50 MG tablet Take 1 tablet (50 mg total) by mouth at bedtime. 90 tablet 0   nitroGLYCERIN (NITROSTAT) 0.4 MG SL tablet Place 1 tablet (0.4 mg total) under the tongue every 5 (five) minutes as needed for chest pain. 25 tablet 6   pantoprazole (PROTONIX) 40 MG tablet TAKE 1 TABLET(40 MG) BY MOUTH DAILY (Patient taking differently: Take 40 mg by mouth daily.) 90 tablet 1   pravastatin (PRAVACHOL) 20 MG tablet Take 1 tablet (20 mg total) by mouth daily. 90 tablet 1   indomethacin (INDOCIN) 50 MG capsule Take 1 capsule (50 mg total) by mouth 4 (four) times daily as needed. 120 capsule 1   No facility-administered medications prior to visit.    Allergies  Allergen Reactions   Nexlizet [Bempedoic Acid-Ezetimibe] Other (See Comments)    Pain in legs from toes to knees   Prednisone     Hearing loss that did resolve    Review of Systems  Constitutional:  Negative for chills, diaphoresis, fatigue and fever.  HENT:  Negative for congestion, ear pain, sinus pressure, sinus pain and sore throat.   Eyes:  Positive for visual disturbance (floaters). Negative for pain.  Respiratory:  Negative for cough and shortness of breath.   Cardiovascular:  Negative for chest pain.  Gastrointestinal:  Negative for abdominal pain, constipation, diarrhea, nausea and vomiting.  Genitourinary:  Negative for dysuria, frequency and  urgency.  Musculoskeletal:  Positive for myalgias (bilateral lower extremities). Negative for arthralgias.  Skin:  Negative for rash.  Neurological:  Positive for numbness (bilateral lower extremities). Negative for weakness and headaches.  Psychiatric/Behavioral:  Positive for sleep disturbance. Negative for dysphoric mood. The patient is not nervous/anxious.        Objective:        06/25/2023    1:19 PM 06/17/2023   10:44 AM 06/17/2023   10:12 AM  Vitals with BMI  Height 5\' 11"   5\' 11"   Weight 232 lbs 3 oz  237 lbs 10 oz  BMI 32.4  33.15  Systolic 130 130 161  Diastolic 64 66 68  Pulse 98  99    Orthostatic VS for the past 72 hrs (Last 3 readings):  Patient Position BP Location Cuff Size  06/25/23 1319 Sitting Left Arm Large     Physical Exam Constitutional:      General: He is not in acute distress.    Appearance: Normal appearance.  HENT:     Head: Normocephalic.  Eyes:     General: Lids are normal.  Cardiovascular:     Rate and Rhythm: Normal rate and regular rhythm.     Heart sounds: Normal heart sounds.  Pulmonary:     Effort: Pulmonary effort is normal.     Breath sounds: Normal breath sounds. No wheezing.  Abdominal:     General: Bowel sounds are normal.     Palpations: Abdomen is soft.     Tenderness: There is no abdominal tenderness.  Musculoskeletal:        General: Tenderness (BLE) present.     Right lower leg: Tenderness present. 1+ Pitting Edema present.     Left lower leg: Tenderness present. 1+ Pitting Edema present.     Right foot: Tenderness present.     Left foot: Tenderness present.     Comments: Thompson test  - negative in both legs   Skin:    General: Skin  is warm.     Capillary Refill: Capillary refill takes less than 2 seconds.     Findings: No erythema or rash.  Neurological:     Mental Status: He is alert. Mental status is at baseline.  Psychiatric:        Mood and Affect: Mood normal.     Health Maintenance Due  Topic  Date Due   Lung Cancer Screening  Never done   COVID-19 Vaccine (5 - 2023-24 season) 04/20/2023    There are no preventive care reminders to display for this patient.   Lab Results  Component Value Date   TSH 1.700 10/07/2022   Lab Results  Component Value Date   WBC 9.4 05/30/2023   HGB 12.4 (L) 05/30/2023   HCT 41.3 05/30/2023   MCV 82 05/30/2023   PLT 283 05/30/2023   Lab Results  Component Value Date   NA 135 04/07/2023   K 5.3 (H) 04/07/2023   CO2 23 04/07/2023   GLUCOSE 89 04/07/2023   BUN 10 04/07/2023   CREATININE 1.16 04/07/2023   BILITOT 0.6 04/07/2023   ALKPHOS 113 04/07/2023   AST 19 04/07/2023   ALT 12 04/07/2023   PROT 6.9 04/07/2023   ALBUMIN 4.3 04/07/2023   CALCIUM 9.4 04/07/2023   ANIONGAP 8 05/21/2017   EGFR 65 04/07/2023   Lab Results  Component Value Date   CHOL 164 04/07/2023   Lab Results  Component Value Date   HDL 46 04/07/2023   Lab Results  Component Value Date   LDLCALC 92 04/07/2023   Lab Results  Component Value Date   TRIG 149 04/07/2023   Lab Results  Component Value Date   CHOLHDL 3.6 04/07/2023   Lab Results  Component Value Date   HGBA1C 4.7 (L) 05/20/2017       Assessment & Plan:  Myalgia, multiple sites Assessment & Plan: Sub acute Ongoing from when he started taking the nexlizet medication given by his cardiologist Patient has discontinued this medication on 05/14/23 over a month ago. Patient thought this was related to his gout history and was given indomethacin 50 mg THREE TIMES A DAY for 30 days and was given a steroid shot. Toradol 60 mg IM given today in the office for pain Labs drawn Await labs/testing for assessment and recommendations    Orders: -     Comprehensive metabolic panel -     Magnesium -     CK -     Phosphorus -     Sedimentation rate -     C-reactive protein -     Ketorolac Tromethamine  Numbness and tingling of both lower extremities Assessment & Plan: Sub acute Labs  drawn Await labs/testing for assessment and recommendations   Orders: -     B12 and Folate Panel -     Methylmalonic acid, serum -     TSH  Mixed hyperlipidemia Assessment & Plan: Well controlled Patient was recently given nexlizet 180mg /10 mg on 05/08/23 in lieu of zetia - side effects included myalgias, numbness and pain in BLE. First workup was for gout flare. Uric acid level was normal, indocin not helping Discontinue Pravastatin 20 mg for 4 weeks Continue Zetia 10 mg by mouth once daily Labs drawn Await labs/testing for assessment and recommendations    Vision changes Assessment & Plan: Acute - labs drawn to [facility] Await labs/testing for assessment and recommendations       Meds ordered this encounter  Medications   ketorolac (TORADOL)  injection 60 mg    Orders Placed This Encounter  Procedures   Comprehensive metabolic panel   Magnesium   CK   Phosphorus   Sedimentation Rate   C-reactive protein   B12 and Folate Panel   Methylmalonic acid, serum   TSH     Follow-up: Return in about 1 month (around 07/25/2023) for FU on myalgia.  An After Visit Summary was printed and given to the patient.  Total time spent on today's visit was greater than 40 minutes, including both face-to-face time and nonface-to-face time personally spent on review of chart (labs and imaging), discussing labs and goals, discussing further work-up, treatment options, referrals to specialist if needed, reviewing outside records if pertinent, answering patient's questions, and coordinating care.   Lajuana Matte, FNP [facility] [phone]

## 2023-06-25 NOTE — Assessment & Plan Note (Signed)
Sub acute Labs drawn Await labs/testing for assessment and recommendations

## 2023-06-25 NOTE — Assessment & Plan Note (Addendum)
Sub acute Ongoing from when he started taking the nexlizet medication given by his cardiologist Patient has discontinued this medication on 05/14/23 over a month ago. Patient thought this was related to his gout history and was given indomethacin 50 mg THREE TIMES A DAY for 30 days and was given a steroid shot. Toradol 60 mg IM given today in the office for pain Labs drawn Await labs/testing for assessment and recommendations

## 2023-06-25 NOTE — Assessment & Plan Note (Signed)
Acute - labs drawn to [facility] Await labs/testing for assessment and recommendations

## 2023-06-25 NOTE — Patient Instructions (Signed)
Stop taking the pravastatin 20 mg by mouth for 4 weeks

## 2023-06-26 LAB — COMPREHENSIVE METABOLIC PANEL
ALT: 16 [IU]/L (ref 0–44)
AST: 20 [IU]/L (ref 0–40)
Albumin: 4.2 g/dL (ref 3.8–4.8)
Alkaline Phosphatase: 108 [IU]/L (ref 44–121)
BUN/Creatinine Ratio: 11 (ref 10–24)
BUN: 12 mg/dL (ref 8–27)
Bilirubin Total: 0.3 mg/dL (ref 0.0–1.2)
CO2: 23 mmol/L (ref 20–29)
Calcium: 8.9 mg/dL (ref 8.6–10.2)
Chloride: 95 mmol/L — ABNORMAL LOW (ref 96–106)
Creatinine, Ser: 1.08 mg/dL (ref 0.76–1.27)
Globulin, Total: 2.7 g/dL (ref 1.5–4.5)
Glucose: 106 mg/dL — ABNORMAL HIGH (ref 70–99)
Potassium: 4.7 mmol/L (ref 3.5–5.2)
Sodium: 133 mmol/L — ABNORMAL LOW (ref 134–144)
Total Protein: 6.9 g/dL (ref 6.0–8.5)
eGFR: 71 mL/min/{1.73_m2} (ref >59)

## 2023-06-26 LAB — MAGNESIUM: Magnesium: 2.3 mg/dL (ref 1.6–2.3)

## 2023-06-26 LAB — CK: Total CK: 69 U/L (ref 41–331)

## 2023-06-26 LAB — SEDIMENTATION RATE: Sed Rate: 17 mm/h (ref 0–30)

## 2023-06-26 LAB — C-REACTIVE PROTEIN: CRP: 4 mg/L (ref 0–10)

## 2023-06-26 LAB — PHOSPHORUS: Phosphorus: 3 mg/dL (ref 2.8–4.1)

## 2023-06-30 LAB — B12 AND FOLATE PANEL
Folate: 9.3 ng/mL (ref >3.0)
Vitamin B-12: 232 pg/mL (ref 232–1245)

## 2023-06-30 LAB — METHYLMALONIC ACID, SERUM: Methylmalonic Acid: 255 nmol/L (ref 0–378)

## 2023-06-30 LAB — TSH: TSH: 1.48 u[IU]/mL (ref 0.450–4.500)

## 2023-07-06 ENCOUNTER — Other Ambulatory Visit: Payer: Self-pay | Admitting: Family Medicine

## 2023-07-28 ENCOUNTER — Other Ambulatory Visit: Payer: Self-pay | Admitting: Family Medicine

## 2023-07-28 ENCOUNTER — Ambulatory Visit (INDEPENDENT_AMBULATORY_CARE_PROVIDER_SITE_OTHER): Payer: Medicare Other | Admitting: Family Medicine

## 2023-07-28 ENCOUNTER — Other Ambulatory Visit: Payer: Self-pay

## 2023-07-28 ENCOUNTER — Encounter: Payer: Self-pay | Admitting: Family Medicine

## 2023-07-28 VITALS — BP 138/62 | HR 89 | Temp 98.6°F | Resp 16 | Ht 71.0 in | Wt 239.0 lb

## 2023-07-28 DIAGNOSIS — R7309 Other abnormal glucose: Secondary | ICD-10-CM

## 2023-07-28 DIAGNOSIS — M79672 Pain in left foot: Secondary | ICD-10-CM | POA: Diagnosis not present

## 2023-07-28 DIAGNOSIS — M79661 Pain in right lower leg: Secondary | ICD-10-CM | POA: Diagnosis not present

## 2023-07-28 DIAGNOSIS — E782 Mixed hyperlipidemia: Secondary | ICD-10-CM | POA: Diagnosis not present

## 2023-07-28 DIAGNOSIS — M79662 Pain in left lower leg: Secondary | ICD-10-CM | POA: Diagnosis not present

## 2023-07-28 DIAGNOSIS — G609 Hereditary and idiopathic neuropathy, unspecified: Secondary | ICD-10-CM

## 2023-07-28 DIAGNOSIS — D649 Anemia, unspecified: Secondary | ICD-10-CM | POA: Diagnosis not present

## 2023-07-28 DIAGNOSIS — S92252A Displaced fracture of navicular [scaphoid] of left foot, initial encounter for closed fracture: Secondary | ICD-10-CM | POA: Diagnosis not present

## 2023-07-28 DIAGNOSIS — M7731 Calcaneal spur, right foot: Secondary | ICD-10-CM | POA: Diagnosis not present

## 2023-07-28 HISTORY — DX: Pain in right lower leg: M79.661

## 2023-07-28 HISTORY — DX: Hereditary and idiopathic neuropathy, unspecified: G60.9

## 2023-07-28 HISTORY — DX: Other abnormal glucose: R73.09

## 2023-07-28 NOTE — Progress Notes (Signed)
Acute Office Visit  Subjective:     Patient ID: Joseph Mercado, male    DOB: 02/27/1946, 77 y.o.   MRN: [id number]  Chief Complaint  Patient presents with   Foot Pain    HPI The patient presents with a two-month history of bilateral foot pain, described as a constant sensation of 'pins and needles' around the feet, extending up to the knees. The pain is not localized to the joints and is not affected by flexing the foot. However, pressure, such as pressing on the brake pedal, exacerbates the discomfort. The pain is severe enough to cause insomnia. The patient has tried pain medication, but it has not provided significant relief. The patient also has a history of gout, which typically affects the toes, and has been managed with a prescription for 100 mg of allopurinol TWICE A DAY as needed and indomethacin 50 mg THREE TIMES A DAY with meals . The patient does not take the gout medication regularly but has found it effective in managing gout pain when needed. The patient has stopped taking cholesterol medication (Pravastatin and Zetia) about a month ago due to concerns of a potential interaction with nexlizet.  Labs drawn 10.11.24 showed: Uric acid level - normal Hemoglobin - low, MCH and MCHC - low  Labs drawn 11.6.24 showed: B12, MMA and folate - normal  sed rate - normal  thyroid - normal Glucose - elevated @ 109, no history of diabetes, but patient admits to eating to many sweets. The patient denies any fever, chills, night sweats, congestion, sinus problems, chest pain, shortness of breath, cough, stomach problems, or issues with urination.  Review of Systems  Constitutional:  Negative for chills, diaphoresis and fever.  HENT:  Negative for congestion and sinus pain.   Respiratory:  Negative for cough and shortness of breath.   Cardiovascular:  Negative for chest pain.  Gastrointestinal: Negative.  Negative for abdominal pain, constipation and diarrhea.  Genitourinary: Negative.    Musculoskeletal:  Positive for myalgias (bilateral feet).  Neurological:  Positive for tingling (bilateral feet).  Psychiatric/Behavioral:  The patient has insomnia.        Objective:    BP 138/62 (BP Location: Left Arm, Patient Position: Sitting, Cuff Size: Large)   Pulse 89   Temp 98.6 F (37 C) (Temporal)   Resp 16   Ht 5\' 11"  (1.803 m)   Wt 239 lb (108.4 kg)   SpO2 98%   BMI 33.33 kg/m  BP Readings from Last 3 Encounters:  07/28/23 138/62  06/25/23 130/64  06/17/23 130/66   Wt Readings from Last 3 Encounters:  07/28/23 239 lb (108.4 kg)  06/25/23 232 lb 3.2 oz (105.3 kg)  06/17/23 237 lb 9.6 oz (107.8 kg)      Physical Exam Vitals reviewed.  Constitutional:      General: He is not in acute distress.    Appearance: Normal appearance. He is not ill-appearing.  Neck:     Vascular: No carotid bruit.  Cardiovascular:     Rate and Rhythm: Normal rate and regular rhythm.     Heart sounds: Normal heart sounds.  Pulmonary:     Effort: Pulmonary effort is normal.     Breath sounds: Normal breath sounds.  Abdominal:     General: Bowel sounds are normal.     Palpations: Abdomen is soft.     Tenderness: There is no abdominal tenderness.  Neurological:     Mental Status: He is alert. Mental status is at baseline.  Psychiatric:        Mood and Affect: Mood normal.        Behavior: Behavior normal.           Assessment & Plan:   Problem List Items Addressed This Visit       Nervous and Auditory   Idiopathic peripheral neuropathy    Persistent pins and needles sensation in both feet extending up to the knees. Pain exacerbated by pressure and movement. No relief with current pain medication. No associated fever, chills, night sweats, or systemic symptoms. -Order X-ray of both feet to rule out any structural abnormalities. -Order ultrasound of both legs to rule out deep vein thrombosis. -Check A1c to rule out diabetes as a potential cause of neuropathy. FU in  1 month        Other   Mixed hyperlipidemia    Well controlled Patient was recently given nexlizet 180mg /10 mg on 05/08/23 in lieu of zetia - side effects included myalgias, numbness and pain in BLE. First workup was for gout flare. Uric acid level was normal, indocin not helping Patient not taking pravastatin or Zetia and his foot and legs are still hurting. Labs drawn Await labs/testing for assessment and recommendations       Relevant Orders   Lipid Panel   Comprehensive metabolic panel   Anemia    Hemoglobin 12.4 Labs drawn today Await labs/testing for assessment and recommendations       Relevant Orders   CBC with Differential   Pain in both lower legs - Primary    Unsure if the bilateral pain is due to his gouty arthritis, peripheral neuropathy, or something more serious like DVT or any bony abnormalities. Xray and [place] ordered.      Relevant Orders   DG Foot Complete Left   DG Foot Complete Right   VAS [place] LOWER EXTREMITY VENOUS (DVT)   Elevated glucose level    Glucose - 109 Labs drawn Await labs/testing for assessment and recommendations       Relevant Orders   Hemoglobin A1c    No orders of the defined types were placed in this encounter.   Follow-up: Return in about 1 week (around 08/04/2023) for follow-up.  Total time spent on today's visit was 33 minutes, including both face-to-face time and nonface-to-face time personally spent on review of chart (labs and imaging), discussing labs and goals, discussing further work-up, treatment options, referrals to specialist if needed, reviewing outside records of pertinent, answering patient's questions, and coordinating care.   Lajuana Matte, FNP [facility] [phone]

## 2023-07-28 NOTE — Assessment & Plan Note (Signed)
Hemoglobin 12.4 Labs drawn today Await labs/testing for assessment and recommendations

## 2023-07-28 NOTE — Assessment & Plan Note (Addendum)
Persistent pins and needles sensation in both feet extending up to the knees. Pain exacerbated by pressure and movement. No relief with current pain medication. No associated fever, chills, night sweats, or systemic symptoms. -Order X-ray of both feet to rule out any structural abnormalities. -Order ultrasound of both legs to rule out deep vein thrombosis. -Check A1c to rule out diabetes as a potential cause of neuropathy. FU in 1 month

## 2023-07-28 NOTE — Assessment & Plan Note (Signed)
Glucose - 109 Labs drawn Await labs/testing for assessment and recommendations

## 2023-07-28 NOTE — Progress Notes (Signed)
Ordered referral to podiatry and nerve conduction test. Patient made aware of results from foot X-rays and what time to be at the hospital for [place] (930 am).

## 2023-07-28 NOTE — Assessment & Plan Note (Signed)
Well controlled Patient was recently given nexlizet 180mg /10 mg on 05/08/23 in lieu of zetia - side effects included myalgias, numbness and pain in BLE. First workup was for gout flare. Uric acid level was normal, indocin not helping Patient not taking pravastatin or Zetia and his foot and legs are still hurting. Labs drawn Await labs/testing for assessment and recommendations

## 2023-07-28 NOTE — Assessment & Plan Note (Signed)
Unsure if the bilateral pain is due to his gouty arthritis, peripheral neuropathy, or something more serious like DVT or any bony abnormalities. Xray and [place] ordered.

## 2023-07-29 DIAGNOSIS — M79672 Pain in left foot: Secondary | ICD-10-CM | POA: Diagnosis not present

## 2023-07-29 DIAGNOSIS — M79662 Pain in left lower leg: Secondary | ICD-10-CM | POA: Diagnosis not present

## 2023-07-29 DIAGNOSIS — M79661 Pain in right lower leg: Secondary | ICD-10-CM | POA: Diagnosis not present

## 2023-07-29 LAB — COMPREHENSIVE METABOLIC PANEL
ALT: 14 [IU]/L (ref 0–44)
AST: 16 [IU]/L (ref 0–40)
Albumin: 4.1 g/dL (ref 3.8–4.8)
Alkaline Phosphatase: 127 [IU]/L — ABNORMAL HIGH (ref 44–121)
BUN/Creatinine Ratio: 11 (ref 10–24)
BUN: 12 mg/dL (ref 8–27)
Bilirubin Total: 0.5 mg/dL (ref 0.0–1.2)
CO2: 23 mmol/L (ref 20–29)
Calcium: 9.4 mg/dL (ref 8.6–10.2)
Chloride: 95 mmol/L — ABNORMAL LOW (ref 96–106)
Creatinine, Ser: 1.11 mg/dL (ref 0.76–1.27)
Globulin, Total: 2.5 g/dL (ref 1.5–4.5)
Glucose: 92 mg/dL (ref 70–99)
Potassium: 5 mmol/L (ref 3.5–5.2)
Sodium: 132 mmol/L — ABNORMAL LOW (ref 134–144)
Total Protein: 6.6 g/dL (ref 6.0–8.5)
eGFR: 68 mL/min/{1.73_m2} (ref >59)

## 2023-07-29 LAB — CBC WITH DIFFERENTIAL/PLATELET
Basophils Absolute: 0.1 10*3/uL (ref 0.0–0.2)
Basos: 1 %
EOS (ABSOLUTE): 0.1 10*3/uL (ref 0.0–0.4)
Eos: 1 %
Hematocrit: 39.3 % (ref 37.5–51.0)
Hemoglobin: 11.9 g/dL — ABNORMAL LOW (ref 13.0–17.7)
Immature Grans (Abs): 0.1 10*3/uL (ref 0.0–0.1)
Immature Granulocytes: 1 %
Lymphocytes Absolute: 2.1 10*3/uL (ref 0.7–3.1)
Lymphs: 21 %
MCH: 24.6 pg — ABNORMAL LOW (ref 26.6–33.0)
MCHC: 30.3 g/dL — ABNORMAL LOW (ref 31.5–35.7)
MCV: 81 fL (ref 79–97)
Monocytes Absolute: 0.8 10*3/uL (ref 0.1–0.9)
Monocytes: 8 %
Neutrophils Absolute: 7 10*3/uL (ref 1.4–7.0)
Neutrophils: 68 %
Platelets: 308 10*3/uL (ref 150–450)
RBC: 4.83 x10E6/uL (ref 4.14–5.80)
RDW: 14.8 % (ref 11.6–15.4)
WBC: 10 10*3/uL (ref 3.4–10.8)

## 2023-07-29 LAB — LIPID PANEL
Chol/HDL Ratio: 5 {ratio} (ref 0.0–5.0)
Cholesterol, Total: 218 mg/dL — ABNORMAL HIGH (ref 100–199)
HDL: 44 mg/dL (ref >39)
LDL Chol Calc (NIH): 148 mg/dL — ABNORMAL HIGH (ref 0–99)
Triglycerides: 145 mg/dL (ref 0–149)
VLDL Cholesterol Cal: 26 mg/dL (ref 5–40)

## 2023-07-29 LAB — HEMOGLOBIN A1C
Est. average glucose Bld gHb Est-mCnc: 128 mg/dL
Hgb A1c MFr Bld: 6.1 % — ABNORMAL HIGH (ref 4.8–5.6)

## 2023-07-30 ENCOUNTER — Encounter: Payer: Self-pay | Admitting: Family Medicine

## 2023-07-30 LAB — IRON,TIBC AND FERRITIN PANEL
Ferritin: 14 ng/mL — ABNORMAL LOW (ref 30–400)
Iron Saturation: 11 % — ABNORMAL LOW (ref 15–55)
Iron: 35 ug/dL — ABNORMAL LOW (ref 38–169)
Total Iron Binding Capacity: 320 ug/dL (ref 250–450)
UIBC: 285 ug/dL (ref 111–343)

## 2023-07-30 LAB — SPECIMEN STATUS REPORT

## 2023-08-04 ENCOUNTER — Telehealth: Payer: Self-pay

## 2023-08-04 ENCOUNTER — Encounter: Payer: Self-pay | Admitting: Family Medicine

## 2023-08-04 ENCOUNTER — Ambulatory Visit (INDEPENDENT_AMBULATORY_CARE_PROVIDER_SITE_OTHER): Payer: Medicare Other | Admitting: Family Medicine

## 2023-08-04 VITALS — BP 122/78 | HR 100 | Temp 97.8°F | Ht 71.0 in | Wt 235.0 lb

## 2023-08-04 DIAGNOSIS — M1A00X0 Idiopathic chronic gout, unspecified site, without tophus (tophi): Secondary | ICD-10-CM | POA: Diagnosis not present

## 2023-08-04 DIAGNOSIS — G629 Polyneuropathy, unspecified: Secondary | ICD-10-CM

## 2023-08-04 DIAGNOSIS — R7303 Prediabetes: Secondary | ICD-10-CM

## 2023-08-04 DIAGNOSIS — D509 Iron deficiency anemia, unspecified: Secondary | ICD-10-CM | POA: Diagnosis not present

## 2023-08-04 DIAGNOSIS — M1A9XX0 Chronic gout, unspecified, without tophus (tophi): Secondary | ICD-10-CM

## 2023-08-04 HISTORY — DX: Polyneuropathy, unspecified: G62.9

## 2023-08-04 HISTORY — DX: Prediabetes: R73.03

## 2023-08-04 MED ORDER — GABAPENTIN 100 MG PO CAPS: 100.0000 mg | ORAL_CAPSULE | Freq: Three times a day (TID) | ORAL | 3 refills | Status: DC

## 2023-08-04 MED ORDER — IRON (FERROUS SULFATE) 325 (65 FE) MG PO TABS: 325.0000 mg | ORAL_TABLET | Freq: Every day | ORAL | 2 refills | Status: DC

## 2023-08-04 NOTE — Progress Notes (Signed)
Acute Office Visit  Subjective:    Patient ID: Joseph Mercado, male    DOB: 10/01/1945, 77 y.o.   MRN: [id number]  Chief Complaint  Patient presents with   Burning in feet    1 week follow up    Discussed the use of AI scribe software for clinical note transcription with the patient, who gave verbal consent to proceed.   HPI: Patient is in today for follow-up on the burning and pain in both legs. The patient had bilateral foot x-rays and an incidental finding of an old fracture of the superior navicular avulsion fracture of the left foot. He had his nerve conduction test and we are awaiting the results.  He is scheduled to see his podiatrist. Patient reports not taking his gout medication. He states that someone told him to stop taking the medication.  Patient wants to know if he should restart cholesterol medication. Patient is wanting to try a medication for neuropathy - prediabetes. Not wanting to take metformin at this time. (control with diet and exercise) requests referral to dietician for help. He has not started taking an iron supplement for his iron defiency anemia. Patient admits that he has trouble keeping up with all his medications.   Past Medical History:  Diagnosis Date   Angina pectoris (HCC)    CAD (coronary artery disease)    a. 02/2017: NSTEMI with cath showing 100% Prox Cx stenosis with 95% Mid Cx stenosis and 45% mid-LAD stenosis. Unsuccessful attempt at crossing an occluded mid nondominant circumflex --> plan for staged PCI in coming weeks   Cardiac/pericardial tamponade 05/14/2017   Chest pain 02/2017   Dyspnea    Encephalopathy acute 05/30/2022   GERD (gastroesophageal reflux disease)    High cholesterol    History of gout    Hypertension    Macular degeneration    NSTEMI (non-ST elevated myocardial infarction) (HCC) 02/2017   Pericardial effusion 04/2017   Pericardial tamponade     Past Surgical History:  Procedure Laterality Date   CATARACT EXTRACTION  W/ INTRAOCULAR LENS  IMPLANT, BILATERAL Bilateral    CORONARY ANGIOPLASTY     CORONARY BALLOON ANGIOPLASTY N/A 03/10/2017   Procedure: Coronary Balloon Angioplasty;  Surgeon: Runell Gess, MD;  Location: [facility] INVASIVE CV LAB;  Service: Cardiovascular;  Laterality: N/A;  CFX   CORONARY CTO INTERVENTION N/A 05/14/2017   Procedure: CORONARY CTO INTERVENTION;  Surgeon: Swaziland, Peter M, MD;  Location: [facility] INVASIVE CV LAB;  Service: Cardiovascular;  Laterality: N/A;   LEFT HEART CATH AND CORONARY ANGIOGRAPHY N/A 03/10/2017   Procedure: Left Heart Cath and Coronary Angiography;  Surgeon: Runell Gess, MD;  Location: [facility] INVASIVE CV LAB;  Service: Cardiovascular;  Laterality: N/A;   PERICARDIOCENTESIS N/A 05/14/2017   Procedure: PERICARDIOCENTESIS;  Surgeon: Swaziland, Peter M, MD;  Location: [facility] INVASIVE CV LAB;  Service: Cardiovascular;  Laterality: N/A;   SUBXYPHOID PERICARDIAL WINDOW N/A 05/15/2017   Procedure: SUBXYPHOID PERICARDIAL WINDOW WITH DRAINAGE OF PERICARDIAL EFFUSION;  Surgeon: Alleen Borne, MD;  Location: [facility] OR;  Service: Thoracic;  Laterality: N/A;   ULTRASOUND GUIDANCE FOR VASCULAR ACCESS  05/14/2017   Procedure: Ultrasound Guidance For Vascular Access;  Surgeon: Swaziland, Peter M, MD;  Location: [facility] INVASIVE CV LAB;  Service: Cardiovascular;;    Family History  Problem Relation Age of Onset   CAD Brother        CABG in his 49's    Social History   Socioeconomic History   Marital status: Divorced  Spouse name: Not on file   Number of children: Not on file   Years of education: Not on file   Highest education level: Not on file  Occupational History   Occupation: Retired Proofreader  Tobacco Use   Smoking status: Former    Current packs/day: 3.00    Average packs/day: 3.0 packs/day for 20.0 years (60.0 ttl pk-yrs)    Types: Cigarettes   Smokeless tobacco: Former    Types: Chew   Tobacco comments:    "quit smoking in the 1980s; chewed 4-5 years after I quit smoking"  Vaping  Use   Vaping status: Never Used  Substance and Sexual Activity   Alcohol use: Yes    Alcohol/week: 1.0 standard drink of alcohol    Types: 1 Cans of beer per week    Comment: 05/14/2017 "6-10 beers/day"  06/11/22 - 2016 cut down to three beers a day, has not had any alcohol in the past 6 weeks   Drug use: No   Sexual activity: Not on file  Other Topics Concern   Not on file  Social History Narrative   Not on file   Social Drivers of Health   Financial Resource Strain: Low Risk  (06/17/2023)   Overall Financial Resource Strain (CARDIA)    Difficulty of Paying Living Expenses: Not hard at all  Food Insecurity: No Food Insecurity (06/17/2023)   Hunger Vital Sign    Worried About Running Out of Food in the Last Year: Never true    Ran Out of Food in the Last Year: Never true  Transportation Needs: No Transportation Needs (06/17/2023)   PRAPARE - Administrator, Civil Service (Medical): No    Lack of Transportation (Non-Medical): No  Physical Activity: Sufficiently Active (06/17/2023)   Exercise Vital Sign    Days of Exercise per Week: 7 days    Minutes of Exercise per Session: 60 min  Recent Concern: Physical Activity - Inactive (04/07/2023)   Exercise Vital Sign    Days of Exercise per Week: 0 days    Minutes of Exercise per Session: 0 min  Stress: No Stress Concern Present (06/17/2023)   [employer] of Occupational Health - Occupational Stress Questionnaire    Feeling of Stress : Not at all  Social Connections: Moderately Integrated (04/07/2023)   Social Connection and Isolation Panel [NHANES]    Frequency of Communication with Friends and Family: More than three times a week    Frequency of Social Gatherings with Friends and Family: More than three times a week    Attends Religious Services: More than 4 times per year    Active Member of [employer] or Organizations: No    Attends Banker Meetings: Never    Marital Status: Married  Catering manager  Violence: Not At Risk (06/17/2023)   Humiliation, Afraid, Rape, and Kick questionnaire    Fear of Current or Ex-Partner: No    Emotionally Abused: No    Physically Abused: No    Sexually Abused: No    Outpatient Medications Prior to Visit  Medication Sig Dispense Refill   allopurinol (ZYLOPRIM) 100 MG tablet Take 1 tablet (100 mg total) by mouth 2 (two) times daily as needed (pain). 180 tablet 1   amLODipine (NORVASC) 5 MG tablet TAKE 1 TABLET(5 MG) BY MOUTH DAILY 90 tablet 1   aspirin EC 81 MG tablet Take 1 tablet (81 mg total) by mouth daily. Resume on 05/20/17. 30 tablet 12   ibuprofen (ADVIL) 600 MG  tablet Take 1 tablet (600 mg total) by mouth every 8 (eight) hours as needed. 90 tablet 1   isosorbide mononitrate (IMDUR) 60 MG 24 hr tablet Take 1 tablet (60 mg total) by mouth daily. 90 tablet 3   losartan (COZAAR) 50 MG tablet TAKE 1 TABLET(50 MG) BY MOUTH AT BEDTIME 90 tablet 0   nitroGLYCERIN (NITROSTAT) 0.4 MG SL tablet Place 1 tablet (0.4 mg total) under the tongue every 5 (five) minutes as needed for chest pain. 25 tablet 6   pantoprazole (PROTONIX) 40 MG tablet TAKE 1 TABLET(40 MG) BY MOUTH DAILY (Patient taking differently: Take 40 mg by mouth daily.) 90 tablet 1   ezetimibe (ZETIA) 10 MG tablet Take 10 mg by mouth daily.     pravastatin (PRAVACHOL) 20 MG tablet Take 1 tablet (20 mg total) by mouth daily. (Patient not taking: Reported on 08/04/2023) 90 tablet 1   No facility-administered medications prior to visit.    Allergies  Allergen Reactions   Nexlizet [Bempedoic Acid-Ezetimibe] Other (See Comments)    Pain in legs from toes to knees   Prednisone     Hearing loss that did resolve    Review of Systems  Constitutional:  Negative for appetite change, fatigue and fever.  HENT:  Negative for congestion, ear pain, sinus pressure and sore throat.   Respiratory:  Negative for cough, chest tightness, shortness of breath and wheezing.   Cardiovascular:  Negative for chest  pain and palpitations.  Gastrointestinal:  Negative for abdominal pain, constipation, diarrhea, nausea and vomiting.  Genitourinary:  Negative for dysuria and hematuria.  Musculoskeletal:  Positive for myalgias (Burning in legs and feet, like pin needles). Negative for arthralgias, back pain and joint swelling.  Skin:  Negative for rash.  Neurological:  Negative for dizziness, weakness and headaches.  Psychiatric/Behavioral:  Positive for dysphoric mood and sleep disturbance. The patient is not nervous/anxious.        Objective:        08/04/2023    9:01 AM 07/28/2023    7:48 AM 06/25/2023    1:19 PM  Vitals with BMI  Height 5\' 11"  5\' 11"  5\' 11"   Weight 235 lbs 239 lbs 232 lbs 3 oz  BMI 32.79 33.35 32.4  Systolic 122 138 161  Diastolic 78 62 64  Pulse 100 89 98    Orthostatic VS for the past 72 hrs (Last 3 readings):  Patient Position BP Location  08/04/23 0901 Sitting Left Arm     Physical Exam Constitutional:      General: He is not in acute distress.    Appearance: Normal appearance. He is not ill-appearing.  HENT:     Head: Normocephalic.  Neck:     Vascular: No carotid bruit.  Cardiovascular:     Rate and Rhythm: Normal rate and regular rhythm.     Heart sounds: Normal heart sounds.  Pulmonary:     Effort: Pulmonary effort is normal.     Breath sounds: Normal breath sounds. No wheezing.  Abdominal:     General: Bowel sounds are normal.     Palpations: Abdomen is soft.     Tenderness: There is no abdominal tenderness.  Musculoskeletal:        General: Tenderness (bilateral lower extremities) present. No signs of injury.     Right lower leg: Edema present.     Left lower leg: Edema present.  Skin:    Findings: No bruising, erythema or rash.  Neurological:     Mental Status:  He is alert. Mental status is at baseline.  Psychiatric:        Mood and Affect: Mood normal.        Behavior: Behavior normal.     Health Maintenance Due  Topic Date Due   Lung  Cancer Screening  Never done   COVID-19 Vaccine (5 - 2024-25 season) 04/20/2023    There are no preventive care reminders to display for this patient.   Lab Results  Component Value Date   TSH 1.480 06/25/2023   Lab Results  Component Value Date   WBC 10.0 07/28/2023   HGB 11.9 (L) 07/28/2023   HCT 39.3 07/28/2023   MCV 81 07/28/2023   PLT 308 07/28/2023   Lab Results  Component Value Date   NA 132 (L) 07/28/2023   K 5.0 07/28/2023   CO2 23 07/28/2023   GLUCOSE 92 07/28/2023   BUN 12 07/28/2023   CREATININE 1.11 07/28/2023   BILITOT 0.5 07/28/2023   ALKPHOS 127 (H) 07/28/2023   AST 16 07/28/2023   ALT 14 07/28/2023   PROT 6.6 07/28/2023   ALBUMIN 4.1 07/28/2023   CALCIUM 9.4 07/28/2023   ANIONGAP 8 05/21/2017   EGFR 68 07/28/2023   Lab Results  Component Value Date   CHOL 218 (H) 07/28/2023   Lab Results  Component Value Date   HDL 44 07/28/2023   Lab Results  Component Value Date   LDLCALC 148 (H) 07/28/2023   Lab Results  Component Value Date   TRIG 145 07/28/2023   Lab Results  Component Value Date   CHOLHDL 5.0 07/28/2023   Lab Results  Component Value Date   HGBA1C 6.1 (H) 07/28/2023       Assessment & Plan:  Chronic gouty arthritis Assessment & Plan: Restart allopurinol 100 mg TWICE A DAY    Prediabetes Assessment & Plan: A1C - 6.1% - Pre-diabetes -Patient wants to control this with diet and exercise rather than taking another medication.  -Referral to dietician  Orders: -     Amb Referral to Nutrition and Diabetic Education  Iron deficiency anemia, unspecified iron deficiency anemia type Assessment & Plan: Hemoglobin 12.4, iron studies (Iron - 35, Ferritin - 14, Iron saturation - 11) iron deficiency anemia  -Ferrous sulfate - 325 mg once daily Take iron pills when your stomach is empty. If you cannot handle this, take them with food. Do not drink milk or take antacids at the same time as your iron pills. Iron pills may turn  your poop (stool)black.  Orders: -     Iron (Ferrous Sulfate); Take 325 mg by mouth daily.  Dispense: 30 tablet; Refill: 2  Neuropathy Assessment & Plan: Not controlled - Awaiting test results of nerve conduction test -Gabapentin 100 mg THREE TIMES A DAY   Orders: -     Gabapentin; Take 1 capsule (100 mg total) by mouth 3 (three) times daily.  Dispense: 90 capsule; Refill: 3     Meds ordered this encounter  Medications   Iron, Ferrous Sulfate, 325 (65 Fe) MG TABS    Sig: Take 325 mg by mouth daily.    Dispense:  30 tablet    Refill:  2   gabapentin (NEURONTIN) 100 MG capsule    Sig: Take 1 capsule (100 mg total) by mouth 3 (three) times daily.    Dispense:  90 capsule    Refill:  3    Orders Placed This Encounter  Procedures   Ambulatory referral to Nutrition and Diabetic Education  Follow-up: Return in about 1 month (around 09/04/2023).  An After Visit Summary was printed and given to the patient.  Total time spent on today's visit was 39 minutes, including both face-to-face time and nonface-to-face time personally spent on review of chart (labs and imaging), discussing labs and goals, discussing further work-up, treatment options, referrals to specialist if needed, reviewing outside records if pertinent, answering patient's questions, and coordinating care.    Lajuana Matte, FNP [facility] [phone]

## 2023-08-04 NOTE — Assessment & Plan Note (Signed)
Hemoglobin 12.4, iron studies (Iron - 35, Ferritin - 14, Iron saturation - 11) iron deficiency anemia  -Ferrous sulfate - 325 mg once daily Take iron pills when your stomach is empty. If you cannot handle this, take them with food. Do not drink milk or take antacids at the same time as your iron pills. Iron pills may turn your poop (stool)black.

## 2023-08-04 NOTE — Telephone Encounter (Signed)
Patient called and stated that he was wanting his result for nerve study test.   Made patient aware, that we will call for results and call him with results.

## 2023-08-04 NOTE — Assessment & Plan Note (Signed)
Restart allopurinol 100 mg TWICE A DAY

## 2023-08-04 NOTE — Assessment & Plan Note (Signed)
Not controlled - Awaiting test results of nerve conduction test -Gabapentin 100 mg THREE TIMES A DAY

## 2023-08-04 NOTE — Assessment & Plan Note (Signed)
A1C - 6.1% - Pre-diabetes -Patient wants to control this with diet and exercise rather than taking another medication.  -Referral to dietician

## 2023-08-06 ENCOUNTER — Ambulatory Visit: Payer: Medicare Other | Admitting: Podiatry

## 2023-08-06 DIAGNOSIS — M79671 Pain in right foot: Secondary | ICD-10-CM

## 2023-08-06 DIAGNOSIS — M79672 Pain in left foot: Secondary | ICD-10-CM | POA: Diagnosis not present

## 2023-08-06 DIAGNOSIS — R7303 Prediabetes: Secondary | ICD-10-CM | POA: Diagnosis not present

## 2023-08-06 DIAGNOSIS — G6289 Other specified polyneuropathies: Secondary | ICD-10-CM | POA: Diagnosis not present

## 2023-08-06 NOTE — Progress Notes (Signed)
Chief Complaint  Patient presents with   Foot Pain    BILAT PAIN AND TINGLING. SENSITIVE TO TOUCH. X 2 WEEKS. HX OF GOUT, NEUROPATHY DX PENDING. PAIN 10/10 ALL THE TIME, PT STATES HE IS NOT SLEEPING.  BOTTOMS FEEL NUMB WORSE WHEN AT REST, ACTIVE IS NOT AS SEVERE.    HPI: 77 y.o. male presents today for evaluation of possible neuropathy in his feet.  He is prediabetic.  He states he just recently had an EMG/nerve conduction study performed.  He has the report with him today for my review.  Notes that his other physician placed him on gabapentin 100 mg 3 times daily and was finally having improvement to where he can sleep.  He noted the neuropathy was so bad he could not sleep.  He states that his neuropathy calms down when he is active and walking around.  He notes that it is worse when he is off his feet as well as when trying to sleep.  He notes his feet are very sensitive.  Past Medical History:  Diagnosis Date   Angina pectoris (HCC)    CAD (coronary artery disease)    a. 02/2017: NSTEMI with cath showing 100% Prox Cx stenosis with 95% Mid Cx stenosis and 45% mid-LAD stenosis. Unsuccessful attempt at crossing an occluded mid nondominant circumflex --> plan for staged PCI in coming weeks   Cardiac/pericardial tamponade 05/14/2017   Chest pain 02/2017   Dyspnea    Encephalopathy acute 05/30/2022   GERD (gastroesophageal reflux disease)    High cholesterol    History of gout    Hypertension    Macular degeneration    NSTEMI (non-ST elevated myocardial infarction) (HCC) 02/2017   Pericardial effusion 04/2017   Pericardial tamponade     Past Surgical History:  Procedure Laterality Date   CATARACT EXTRACTION W/ INTRAOCULAR LENS  IMPLANT, BILATERAL Bilateral    CORONARY ANGIOPLASTY     CORONARY BALLOON ANGIOPLASTY N/A 03/10/2017   Procedure: Coronary Balloon Angioplasty;  Surgeon: Runell Gess, MD;  Location: [facility] INVASIVE CV LAB;  Service: Cardiovascular;  Laterality: N/A;  CFX    CORONARY CTO INTERVENTION N/A 05/14/2017   Procedure: CORONARY CTO INTERVENTION;  Surgeon: Swaziland, Peter M, MD;  [facility] INVASIVE CV LAB;  Service: Cardiovascular;  Laterality: N/A;   LEFT HEART CATH AND CORONARY ANGIOGRAPHY N/A 03/10/2017   Procedure: Left Heart Cath and Coronary Angiography;  Surgeon: Runell Gess, MD;  Location: [facility] INVASIVE CV LAB;  Service: Cardiovascular;  Laterality: N/A;   PERICARDIOCENTESIS N/A 05/14/2017   Procedure: PERICARDIOCENTESIS;  Surgeon: Swaziland, Peter M, MD;  Location: [facility] INVASIVE CV LAB;  Service: Cardiovascular;  Laterality: N/A;   SUBXYPHOID PERICARDIAL WINDOW N/A 05/15/2017   Procedure: SUBXYPHOID PERICARDIAL WINDOW WITH DRAINAGE OF PERICARDIAL EFFUSION;  Surgeon: Alleen Borne, MD;  Location: [facility] OR;  Service: Thoracic;  Laterality: N/A;   ULTRASOUND GUIDANCE FOR VASCULAR ACCESS  05/14/2017   Procedure: Ultrasound Guidance For Vascular Access;  Surgeon: Swaziland, Peter M, MD;  Location: [facility] INVASIVE CV LAB;  Service: Cardiovascular;;    Allergies  Allergen Reactions   Nexlizet [Bempedoic Acid-Ezetimibe] Other (See Comments)    Pain in legs from toes to knees   Prednisone     Hearing loss that did resolve    Physical Exam: Palpable pedal pulses bilateral.  Mild edema bilateral legs and ankles.  Feet and toes are hypersensitive today with intact protective sensation using a Semmes Weinstein monofilament.  Temperature sensation is intact.  Vibratory sensation  is intact to the forefoot.  Manual muscle testing 5/5 bilateral.  Sparse digital hair.  Ankle dorsiflexion is slightly limited secondary to the tight gastrosoleus complex.  Assessment/Plan of Care: 1. Prediabetes   2. Other polyneuropathy   3. Pain in both feet     Discussed clinical findings with patient today.  The patient's EMG/NCV results were reviewed today.  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  He will continue with the gabapentin 100 mg 3 times daily as prescribed by his other  physician.  Informed patient he may need to stay on this forever and the dosage may need to be adjusted in the future as he might become tolerant to the current dosage.  Recommended patient follow-up annually at our office.  However if patient progresses from prediabetes to type 2 diabetes with neuropathy, he will need to follow-up every 3 months for at risk footcare.  Clerance Lav, DPM, FACFAS [facility]     [address] [place], [place] [address]                Office [phone]  Fax [phone]

## 2023-08-10 ENCOUNTER — Encounter: Payer: Self-pay | Admitting: Podiatry

## 2023-08-11 ENCOUNTER — Other Ambulatory Visit: Payer: Self-pay

## 2023-08-11 DIAGNOSIS — G609 Hereditary and idiopathic neuropathy, unspecified: Secondary | ICD-10-CM

## 2023-08-14 ENCOUNTER — Telehealth: Payer: Self-pay

## 2023-08-14 ENCOUNTER — Telehealth: Payer: Self-pay | Admitting: Family Medicine

## 2023-08-14 NOTE — Telephone Encounter (Signed)
Copied from CRM [id number]. Topic: Clinical - Medication Question >> Aug 14, 2023 10:09 AM Ivette P wrote: Reason for CRM: Patient has questions about his medication (gabapentin (NEURONTIN) 100 MG capsule) and would like them answered. Requesting call at [phone]

## 2023-08-14 NOTE — Telephone Encounter (Signed)
Spoke with patient read telephone note in chart.

## 2023-08-14 NOTE — Telephone Encounter (Signed)
Patient stated that the gabapentin has been working for the last 6 days, but today he his pain got worst and he took an extra gabapentin and that helped the pain and is sitting under a heating blanket.  Made patient aware it is okay to take a extra gabapentin if needed, but if pains get worst and extra pill does not work call office and let provider know. Recommend patient not take more than 1 extra a day.

## 2023-08-14 NOTE — Telephone Encounter (Unsigned)
Copied from CRM [id number]. Topic: Clinical - Medication Question >> Aug 14, 2023 10:09 AM Ivette P wrote: Reason for CRM: Patient has questions about his medication (gabapentin (NEURONTIN) 100 MG capsule) and would like them answered. Requesting call at [phone]

## 2023-09-04 ENCOUNTER — Ambulatory Visit (INDEPENDENT_AMBULATORY_CARE_PROVIDER_SITE_OTHER): Payer: Medicare Other | Admitting: Family Medicine

## 2023-09-04 ENCOUNTER — Encounter: Payer: Self-pay | Admitting: Family Medicine

## 2023-09-04 VITALS — BP 130/82 | HR 59 | Temp 97.8°F | Ht 71.0 in | Wt 238.0 lb

## 2023-09-04 DIAGNOSIS — G609 Hereditary and idiopathic neuropathy, unspecified: Secondary | ICD-10-CM

## 2023-09-04 MED ORDER — GABAPENTIN 300 MG PO CAPS: 300.0000 mg | ORAL_CAPSULE | Freq: Two times a day (BID) | ORAL | 1 refills | Status: DC

## 2023-09-04 NOTE — Progress Notes (Signed)
Subjective:  Patient ID: Joseph Mercado, male    DOB: 01-30-1946  Age: 78 y.o. MRN: [id number]  Chief Complaint  Patient presents with   Burning in feet and legs    1 month follow up   Discussed the use of AI scribe software for clinical note transcription with the patient, who gave verbal consent to proceed.   HPI   Idiopathic peripheral neuropathy: Patient is here today for a 1 month follow up since starting the gabapentin 100 mg TID.  The patient, with a history of peripheral neuropathy, presents for a medication adjustment. He currently takes gabapentin 100mg  three times daily at 6 AM, 2 PM, and 10 PM. However, he reports difficulty adhering to the 10 PM dose, particularly in cold weather. He also notes that his symptoms of tingling in the feet have been increasing. He proposes increasing the gabapentin to 200mg  twice daily at 6 AM and 6 PM, which would increase his total daily dose from 300mg  to 400mg . He has already filled his current prescription of 100mg  tablets and has been taking an extra dose daily, resulting in a total daily dose of 400mg . He reports no adverse effects from this increased dose.  In addition to his neuropathy, the patient reports occasional leg cramps, which he manages by massaging the affected muscle. He denies any other new symptoms or concerns.     06/17/2023   10:06 AM 06/11/2022    9:23 AM 04/03/2022    8:01 AM 03/27/2021    8:00 AM 03/27/2020    9:23 AM  Depression screen PHQ 2/9  Decreased Interest 0 0 0 0 0  Down, Depressed, Hopeless 0 0 0 0 0  PHQ - 2 Score 0 0 0 0 0  Altered sleeping 0      Tired, decreased energy 0      Change in appetite 0      Feeling bad or failure about yourself  0      Trouble concentrating 0      Moving slowly or fidgety/restless 0      Suicidal thoughts 0      PHQ-9 Score 0      Difficult doing work/chores Not difficult at all            06/17/2023   10:07 AM  Fall Risk   Falls in the past year? 0  Number falls in  past yr: 0  Injury with Fall? 0  Risk for fall due to : No Fall Risks  Follow up Falls evaluation completed    Patient Care Team: Blane Ohara, MD as PCP - General (Internal Medicine) Georgeanna Lea, MD as Consulting Physician (Cardiology) Victorino Sparrow, MD as Consulting Physician (Vascular Surgery) Cherly Beach, MD as Referring Physician (Family Medicine)   Review of Systems  Constitutional:  Negative for appetite change, fatigue and fever.  HENT:  Negative for congestion, ear pain, sinus pressure and sore throat.   Respiratory:  Negative for cough, chest tightness, shortness of breath and wheezing.   Cardiovascular:  Negative for chest pain and palpitations.  Gastrointestinal:  Negative for constipation, diarrhea, nausea and vomiting.  Genitourinary:  Negative for dysuria and hematuria.  Musculoskeletal:  Positive for myalgias (Burning/pain in feet and legs). Negative for arthralgias, back pain and joint swelling.  Skin:  Negative for rash.  Neurological:  Negative for dizziness, weakness and headaches.  Psychiatric/Behavioral:  Negative for dysphoric mood. The patient is not nervous/anxious.     Current Outpatient Medications  on File Prior to Visit  Medication Sig Dispense Refill   allopurinol (ZYLOPRIM) 100 MG tablet Take 1 tablet (100 mg total) by mouth 2 (two) times daily as needed (pain). 180 tablet 1   amLODipine (NORVASC) 5 MG tablet TAKE 1 TABLET(5 MG) BY MOUTH DAILY 90 tablet 1   aspirin EC 81 MG tablet Take 1 tablet (81 mg total) by mouth daily. Resume on 05/20/17. 30 tablet 12   ibuprofen (ADVIL) 600 MG tablet Take 1 tablet (600 mg total) by mouth every 8 (eight) hours as needed. 90 tablet 1   Iron, Ferrous Sulfate, 325 (65 Fe) MG TABS Take 325 mg by mouth daily. 30 tablet 2   isosorbide mononitrate (IMDUR) 60 MG 24 hr tablet Take 1 tablet (60 mg total) by mouth daily. 90 tablet 3   lisinopril (ZESTRIL) 20 MG tablet Take 20 mg by mouth daily.     losartan  (COZAAR) 50 MG tablet TAKE 1 TABLET(50 MG) BY MOUTH AT BEDTIME 90 tablet 0   nitroGLYCERIN (NITROSTAT) 0.4 MG SL tablet Place 1 tablet (0.4 mg total) under the tongue every 5 (five) minutes as needed for chest pain. 25 tablet 6   pantoprazole (PROTONIX) 40 MG tablet TAKE 1 TABLET(40 MG) BY MOUTH DAILY (Patient taking differently: Take 40 mg by mouth daily.) 90 tablet 1   pravastatin (PRAVACHOL) 20 MG tablet Take 1 tablet (20 mg total) by mouth daily. 90 tablet 1   No current facility-administered medications on file prior to visit.   Past Medical History:  Diagnosis Date   Angina pectoris (HCC)    CAD (coronary artery disease)    a. 02/2017: NSTEMI with cath showing 100% Prox Cx stenosis with 95% Mid Cx stenosis and 45% mid-LAD stenosis. Unsuccessful attempt at crossing an occluded mid nondominant circumflex --> plan for staged PCI in coming weeks   Cardiac/pericardial tamponade 05/14/2017   Chest pain 02/2017   Dyspnea    Encephalopathy acute 05/30/2022   GERD (gastroesophageal reflux disease)    High cholesterol    History of gout    Hypertension    Macular degeneration    NSTEMI (non-ST elevated myocardial infarction) (HCC) 02/2017   Pericardial effusion 04/2017   Pericardial tamponade    Past Surgical History:  Procedure Laterality Date   CATARACT EXTRACTION W/ INTRAOCULAR LENS  IMPLANT, BILATERAL Bilateral    CORONARY ANGIOPLASTY     CORONARY BALLOON ANGIOPLASTY N/A 03/10/2017   Procedure: Coronary Balloon Angioplasty;  Surgeon: Runell Gess, MD;  Location: [facility] INVASIVE CV LAB;  Service: Cardiovascular;  Laterality: N/A;  CFX   CORONARY CTO INTERVENTION N/A 05/14/2017   Procedure: CORONARY CTO INTERVENTION;  Surgeon: Swaziland, Peter M, MD;  Location: [facility] INVASIVE CV LAB;  Service: Cardiovascular;  Laterality: N/A;   LEFT HEART CATH AND CORONARY ANGIOGRAPHY N/A 03/10/2017   Procedure: Left Heart Cath and Coronary Angiography;  Surgeon: Runell Gess, MD;  Location: [facility] INVASIVE  CV LAB;  Service: Cardiovascular;  Laterality: N/A;   PERICARDIOCENTESIS N/A 05/14/2017   Procedure: PERICARDIOCENTESIS;  Surgeon: Swaziland, Peter M, MD;  Location: [facility] INVASIVE CV LAB;  Service: Cardiovascular;  Laterality: N/A;   SUBXYPHOID PERICARDIAL WINDOW N/A 05/15/2017   Procedure: SUBXYPHOID PERICARDIAL WINDOW WITH DRAINAGE OF PERICARDIAL EFFUSION;  Surgeon: Alleen Borne, MD;  Location: [facility] OR;  Service: Thoracic;  Laterality: N/A;   ULTRASOUND GUIDANCE FOR VASCULAR ACCESS  05/14/2017   Procedure: Ultrasound Guidance For Vascular Access;  Surgeon: Swaziland, Peter M, MD;  Location: [facility] INVASIVE CV  LAB;  Service: Cardiovascular;;    Family History  Problem Relation Age of Onset   CAD Brother        CABG in his 32's   Social History   Socioeconomic History   Marital status: Divorced    Spouse name: Not on file   Number of children: Not on file   Years of education: Not on file   Highest education level: Not on file  Occupational History   Occupation: Retired Proofreader  Tobacco Use   Smoking status: Former    Current packs/day: 3.00    Average packs/day: 3.0 packs/day for 20.0 years (60.0 ttl pk-yrs)    Types: Cigarettes   Smokeless tobacco: Former    Types: Chew   Tobacco comments:    "quit smoking in the 1980s; chewed 4-5 years after I quit smoking"  Vaping Use   Vaping status: Never Used  Substance and Sexual Activity   Alcohol use: Yes    Alcohol/week: 1.0 standard drink of alcohol    Types: 1 Cans of beer per week    Comment: 05/14/2017 "6-10 beers/day"  06/11/22 - 2016 cut down to three beers a day, has not had any alcohol in the past 6 weeks   Drug use: No   Sexual activity: Not on file  Other Topics Concern   Not on file  Social History Narrative   Not on file   Social Drivers of Health   Financial Resource Strain: Low Risk  (06/17/2023)   Overall Financial Resource Strain (CARDIA)    Difficulty of Paying Living Expenses: Not hard at all  Food Insecurity: No Food  Insecurity (06/17/2023)   Hunger Vital Sign    Worried About Running Out of Food in the Last Year: Never true    Ran Out of Food in the Last Year: Never true  Transportation Needs: No Transportation Needs (06/17/2023)   PRAPARE - Administrator, Civil Service (Medical): No    Lack of Transportation (Non-Medical): No  Physical Activity: Sufficiently Active (06/17/2023)   Exercise Vital Sign    Days of Exercise per Week: 7 days    Minutes of Exercise per Session: 60 min  Recent Concern: Physical Activity - Inactive (04/07/2023)   Exercise Vital Sign    Days of Exercise per Week: 0 days    Minutes of Exercise per Session: 0 min  Stress: No Stress Concern Present (06/17/2023)   [employer] of Occupational Health - Occupational Stress Questionnaire    Feeling of Stress : Not at all  Social Connections: Moderately Integrated (04/07/2023)   Social Connection and Isolation Panel [NHANES]    Frequency of Communication with Friends and Family: More than three times a week    Frequency of Social Gatherings with Friends and Family: More than three times a week    Attends Religious Services: More than 4 times per year    Active Member of [employer] or Organizations: No    Attends Engineer, structural: Never    Marital Status: Married    Objective:  BP 130/82 (BP Location: Left Arm, Patient Position: Sitting)   Pulse (!) 59   Temp 97.8 F (36.6 C) (Temporal)   Ht 5\' 11"  (1.803 m)   Wt 238 lb (108 kg)   SpO2 99%   BMI 33.19 kg/m      09/04/2023   10:24 AM 08/04/2023    9:01 AM 07/28/2023    7:48 AM  BP/Weight  Systolic BP 130 122 138  Diastolic BP 82 78 62  Wt. (Lbs) 238 235 239  BMI 33.19 kg/m2 32.78 kg/m2 33.33 kg/m2    Physical Exam Constitutional:      General: He is not in acute distress.    Appearance: Normal appearance. He is not ill-appearing.  Eyes:     Conjunctiva/sclera: Conjunctivae normal.  Cardiovascular:     Rate and Rhythm: Normal rate and  regular rhythm.     Heart sounds: Normal heart sounds. No murmur heard. Pulmonary:     Effort: Pulmonary effort is normal.     Breath sounds: Normal breath sounds. No wheezing.  Musculoskeletal:        General: No swelling or tenderness. Normal range of motion.     Cervical back: Normal range of motion.     Right lower leg: No edema.     Left lower leg: No edema.  Skin:    General: Skin is warm.  Neurological:     Mental Status: He is alert. Mental status is at baseline.  Psychiatric:        Mood and Affect: Mood normal.        Behavior: Behavior normal.     Lab Results  Component Value Date   WBC 10.0 07/28/2023   HGB 11.9 (L) 07/28/2023   HCT 39.3 07/28/2023   PLT 308 07/28/2023   GLUCOSE 92 07/28/2023   CHOL 218 (H) 07/28/2023   TRIG 145 07/28/2023   HDL 44 07/28/2023   LDLCALC 148 (H) 07/28/2023   ALT 14 07/28/2023   AST 16 07/28/2023   NA 132 (L) 07/28/2023   K 5.0 07/28/2023   CL 95 (L) 07/28/2023   CREATININE 1.11 07/28/2023   BUN 12 07/28/2023   CO2 23 07/28/2023   TSH 1.480 06/25/2023   INR 1.11 05/20/2017   HGBA1C 6.1 (H) 07/28/2023      Assessment & Plan:    Idiopathic peripheral neuropathy Assessment & Plan: Tingling sensation in feet, currently on Gabapentin 300mg  TID. Discussed the difficulty of taking medication at 10pm and the desire to increase the dose due to persistent symptoms. -Increase Gabapentin to 600mg  daily in divided doses (300mg  BID).     No orders of the defined types were placed in this encounter.   No orders of the defined types were placed in this encounter.    Follow-up: Return in about 4 weeks (around 10/02/2023) for chronic - keep appointment w Dr. Sedalia Muta.   An After Visit Summary was printed and given to the patient.  Lajuana Matte, FNP [facility] [phone]

## 2023-09-04 NOTE — Assessment & Plan Note (Addendum)
Improved  Tingling sensation in feet, currently on Gabapentin 300mg  TID. Discussed the difficulty of taking medication at 10pm and the desire to increase the dose due to persistent symptoms. -Increase Gabapentin to 600mg  daily in divided doses (300mg  BID).

## 2023-09-23 ENCOUNTER — Telehealth: Payer: Self-pay | Admitting: Cardiology

## 2023-09-23 NOTE — Telephone Encounter (Signed)
Spoke with pt. Advised that we have not seen any paperwork from his insurance company to fill out. Sent message to front desk to make follow up appt.

## 2023-09-23 NOTE — Telephone Encounter (Signed)
 Patient coming in today because we are supposed to have received some kind of correspondence from his insurance company [employer] about his chronic heart condition so that he qualifies for the heartcare and diabetes plan. Patient would like to be made aware if this has been received or completed/ please CALL ONLY [phone]

## 2023-09-27 ENCOUNTER — Other Ambulatory Visit: Payer: Self-pay | Admitting: Cardiology

## 2023-10-06 ENCOUNTER — Other Ambulatory Visit: Payer: Self-pay | Admitting: Family Medicine

## 2023-10-07 ENCOUNTER — Ambulatory Visit (INDEPENDENT_AMBULATORY_CARE_PROVIDER_SITE_OTHER): Payer: PPO | Admitting: Family Medicine

## 2023-10-07 ENCOUNTER — Encounter: Payer: Self-pay | Admitting: Family Medicine

## 2023-10-07 ENCOUNTER — Other Ambulatory Visit: Payer: Self-pay | Admitting: Family Medicine

## 2023-10-07 VITALS — BP 138/62 | HR 72 | Temp 97.8°F | Ht 71.0 in | Wt 239.0 lb

## 2023-10-07 DIAGNOSIS — G609 Hereditary and idiopathic neuropathy, unspecified: Secondary | ICD-10-CM | POA: Diagnosis not present

## 2023-10-07 DIAGNOSIS — I119 Hypertensive heart disease without heart failure: Secondary | ICD-10-CM | POA: Diagnosis not present

## 2023-10-07 DIAGNOSIS — K219 Gastro-esophageal reflux disease without esophagitis: Secondary | ICD-10-CM

## 2023-10-07 DIAGNOSIS — N529 Male erectile dysfunction, unspecified: Secondary | ICD-10-CM | POA: Diagnosis not present

## 2023-10-07 DIAGNOSIS — R7303 Prediabetes: Secondary | ICD-10-CM | POA: Diagnosis not present

## 2023-10-07 DIAGNOSIS — D509 Iron deficiency anemia, unspecified: Secondary | ICD-10-CM | POA: Diagnosis not present

## 2023-10-07 DIAGNOSIS — E782 Mixed hyperlipidemia: Secondary | ICD-10-CM

## 2023-10-07 NOTE — Assessment & Plan Note (Addendum)
On Pravastatin and Zetia. No reported side effects. Lab Results  Component Value Date   LDLCALC 148 (H) 07/28/2023  -Continue Pravastatin and Zetia. -Labs drawn today

## 2023-10-07 NOTE — Assessment & Plan Note (Addendum)
A1C - 6.1% - Pre-diabetes -Patient wants to control this with diet and exercise rather than taking another medication.  -Continue to work on diet and exercise Labs drawn today, Await labs/testing for assessment and recommendations

## 2023-10-07 NOTE — Assessment & Plan Note (Signed)
Patient reports inability to have sex. Discussed potential side effects of Lisinopril. Patient is not interested in treatment. -No changes to current treatment plan.

## 2023-10-07 NOTE — Progress Notes (Signed)
Subjective:  Patient ID: Joseph Mercado, male    DOB: 10-18-1945  Age: 78 y.o. MRN: [id number]  Chief Complaint  Patient presents with   Medical Management of Chronic Issues   Discussed the use of AI scribe software for clinical note transcription with the patient, who gave verbal consent to proceed.   HPI Joseph Mercado is a 78 year old male who presents for a routine follow-up visit.  He is currently taking medication for neuropathy at a dose of 300 mg twice daily, once in the morning and once in the evening. This regimen has been effective in managing his symptoms without causing depression, a known side effect. No chest pain, shortness of breath, leg swelling, restless legs, sleep problems, urinary issues, gastrointestinal symptoms, or recent illnesses.  He has erectile dysfunction and has tried medications for it, but they have not been effective. He attributes this to age and possibly medication effects.  He maintains a consistent diet and uses a pedal machine at home while watching TV. He walks occasionally, especially when visiting his brother in the hospital. He tends to binge on oatmeal cookies, which he believes contributes to weight fluctuations, typically ranging between 235 and 239 pounds. He has a history of smoking but quit about 35 to 40 years ago. He drinks one beer a day, having significantly reduced his alcohol consumption from a case a day in the past.  He is on lisinopril 20 mg, amlodipine 5 mg, Imdur 60 mg, and a daily aspirin for blood pressure management. For cholesterol, he takes Pravastatin and Zetia. His last A1c was 6.1, indicating prediabetes, which he manages by adjusting his diet. He has not had a cold since starting COVID vaccinations and continues to take flu shots. He declines further COVID vaccination, citing no recent respiratory infections since initial vaccinations.   Hyperlipidemia.   On Pravastatin 20 mg daily, Zetia 10 mg daily without problems.  Eats  healthy and exercises.   GERD - pantoprazole 40 mg daily  Hypertensive heart disease.  Patient has a history of coronary artery disease with a myocardial infarction in 2018.  Currently on lisinopril 20 mg daily, amlodipine 5 mg daily, Imdur 60 mg daily, and aspirin 81 mg daily  Prediabetes - not currently on any medication. Patient wants to try to control it with diet and exercise.  CAD with angina: Taking Isosorbide 60 mg daily, Aspirin 81 mg daily. no CHF EF 55-60%  History of stroke in 04/2022. Sequelae is imbalance.  Idiopathic Peripheral Neuropathy: Gabapentin 300 mg by mouth TWICE A DAY      06/17/2023   10:06 AM 06/11/2022    9:23 AM 04/03/2022    8:01 AM 03/27/2021    8:00 AM 03/27/2020    9:23 AM  Depression screen PHQ 2/9  Decreased Interest 0 0 0 0 0  Down, Depressed, Hopeless 0 0 0 0 0  PHQ - 2 Score 0 0 0 0 0  Altered sleeping 0      Tired, decreased energy 0      Change in appetite 0      Feeling bad or failure about yourself  0      Trouble concentrating 0      Moving slowly or fidgety/restless 0      Suicidal thoughts 0      PHQ-9 Score 0      Difficult doing work/chores Not difficult at all            06/17/2023   10:07  AM  Fall Risk   Falls in the past year? 0  Number falls in past yr: 0  Injury with Fall? 0  Risk for fall due to : No Fall Risks  Follow up Falls evaluation completed    Patient Care Team: Blane Ohara, MD as PCP - General (Internal Medicine) Georgeanna Lea, MD as Consulting Physician (Cardiology) Victorino Sparrow, MD as Consulting Physician (Vascular Surgery) Cherly Beach, MD as Referring Physician (Family Medicine)   Review of Systems  Constitutional:  Negative for appetite change, fatigue and fever.  HENT:  Negative for congestion, ear pain, sinus pressure and sore throat.   Respiratory:  Negative for cough, chest tightness, shortness of breath and wheezing.   Cardiovascular:  Negative for chest pain and palpitations.   Gastrointestinal:  Negative for abdominal pain, constipation, diarrhea, nausea and vomiting.  Genitourinary:  Negative for dysuria and hematuria.       Erectile dysfunction  Musculoskeletal:  Negative for arthralgias, back pain, joint swelling and myalgias.  Skin:  Negative for rash.  Neurological:  Negative for dizziness, weakness and headaches.  Psychiatric/Behavioral:  Negative for dysphoric mood. The patient is not nervous/anxious.     Current Outpatient Medications on File Prior to Visit  Medication Sig Dispense Refill   allopurinol (ZYLOPRIM) 100 MG tablet Take 1 tablet (100 mg total) by mouth 2 (two) times daily as needed (pain). 180 tablet 1   amLODipine (NORVASC) 5 MG tablet TAKE 1 TABLET(5 MG) BY MOUTH DAILY 90 tablet 1   aspirin EC 81 MG tablet Take 1 tablet (81 mg total) by mouth daily. Resume on 05/20/17. 30 tablet 12   gabapentin (NEURONTIN) 300 MG capsule Take 1 capsule (300 mg total) by mouth 2 (two) times daily. 60 capsule 1   ibuprofen (ADVIL) 600 MG tablet Take 1 tablet (600 mg total) by mouth every 8 (eight) hours as needed. 90 tablet 1   Iron, Ferrous Sulfate, 325 (65 Fe) MG TABS Take 325 mg by mouth daily. 30 tablet 2   isosorbide mononitrate (IMDUR) 60 MG 24 hr tablet TAKE 1 TABLET(60 MG) BY MOUTH DAILY 90 tablet 3   lisinopril (ZESTRIL) 20 MG tablet Take 20 mg by mouth daily.     losartan (COZAAR) 50 MG tablet TAKE 1 TABLET(50 MG) BY MOUTH AT BEDTIME 90 tablet 0   nitroGLYCERIN (NITROSTAT) 0.4 MG SL tablet Place 1 tablet (0.4 mg total) under the tongue every 5 (five) minutes as needed for chest pain. 25 tablet 6   pantoprazole (PROTONIX) 40 MG tablet TAKE 1 TABLET(40 MG) BY MOUTH DAILY 90 tablet 1   pravastatin (PRAVACHOL) 20 MG tablet Take 1 tablet (20 mg total) by mouth daily. 90 tablet 1   No current facility-administered medications on file prior to visit.   Past Medical History:  Diagnosis Date   Angina pectoris (HCC)    CAD (coronary artery disease)    a.  02/2017: NSTEMI with cath showing 100% Prox Cx stenosis with 95% Mid Cx stenosis and 45% mid-LAD stenosis. Unsuccessful attempt at crossing an occluded mid nondominant circumflex --> plan for staged PCI in coming weeks   Cardiac/pericardial tamponade 05/14/2017   Chest pain 02/2017   Dyspnea    Encephalopathy acute 05/30/2022   GERD (gastroesophageal reflux disease)    High cholesterol    History of gout    Hypertension    Macular degeneration    NSTEMI (non-ST elevated myocardial infarction) (HCC) 02/2017   Pericardial effusion 04/2017   Pericardial tamponade  Past Surgical History:  Procedure Laterality Date   CATARACT EXTRACTION W/ INTRAOCULAR LENS  IMPLANT, BILATERAL Bilateral    CORONARY ANGIOPLASTY     CORONARY BALLOON ANGIOPLASTY N/A 03/10/2017   Procedure: Coronary Balloon Angioplasty;  Surgeon: Runell Gess, MD;  Location: [facility] INVASIVE CV LAB;  Service: Cardiovascular;  Laterality: N/A;  CFX   CORONARY CTO INTERVENTION N/A 05/14/2017   Procedure: CORONARY CTO INTERVENTION;  Surgeon: Swaziland, Peter M, MD;  Location: [facility] INVASIVE CV LAB;  Service: Cardiovascular;  Laterality: N/A;   LEFT HEART CATH AND CORONARY ANGIOGRAPHY N/A 03/10/2017   Procedure: Left Heart Cath and Coronary Angiography;  Surgeon: Runell Gess, MD;  Location: [facility] INVASIVE CV LAB;  Service: Cardiovascular;  Laterality: N/A;   PERICARDIOCENTESIS N/A 05/14/2017   Procedure: PERICARDIOCENTESIS;  Surgeon: Swaziland, Peter M, MD;  Location: [facility] INVASIVE CV LAB;  Service: Cardiovascular;  Laterality: N/A;   SUBXYPHOID PERICARDIAL WINDOW N/A 05/15/2017   Procedure: SUBXYPHOID PERICARDIAL WINDOW WITH DRAINAGE OF PERICARDIAL EFFUSION;  Surgeon: Alleen Borne, MD;  Location: [facility] OR;  Service: Thoracic;  Laterality: N/A;   ULTRASOUND GUIDANCE FOR VASCULAR ACCESS  05/14/2017   Procedure: Ultrasound Guidance For Vascular Access;  Surgeon: Swaziland, Peter M, MD;  Location: [facility] INVASIVE CV LAB;  Service: Cardiovascular;;     Family History  Problem Relation Age of Onset   CAD Brother        CABG in his 62's   Social History   Socioeconomic History   Marital status: Divorced    Spouse name: Not on file   Number of children: Not on file   Years of education: Not on file   Highest education level: Not on file  Occupational History   Occupation: Retired Proofreader  Tobacco Use   Smoking status: Former    Current packs/day: 0.00    Average packs/day: 3.0 packs/day for 20.0 years (60.0 ttl pk-yrs)    Types: Cigarettes    Quit date: 1985    Years since quitting: 40.1   Smokeless tobacco: Former    Types: Chew   Tobacco comments:    "quit smoking in the 1980s; chewed 4-5 years after I quit smoking"  Vaping Use   Vaping status: Never Used  Substance and Sexual Activity   Alcohol use: Yes    Alcohol/week: 1.0 standard drink of alcohol    Types: 1 Cans of beer per week    Comment: 05/14/2017 "6-10 beers/day"  06/11/22 - 2016 cut down to three beers a day, has not had any alcohol in the past 6 weeks   Drug use: No   Sexual activity: Not on file  Other Topics Concern   Not on file  Social History Narrative   Not on file   Social Drivers of Health   Financial Resource Strain: Low Risk  (06/17/2023)   Overall Financial Resource Strain (CARDIA)    Difficulty of Paying Living Expenses: Not hard at all  Food Insecurity: No Food Insecurity (06/17/2023)   Hunger Vital Sign    Worried About Running Out of Food in the Last Year: Never true    Ran Out of Food in the Last Year: Never true  Transportation Needs: No Transportation Needs (06/17/2023)   PRAPARE - Administrator, Civil Service (Medical): No    Lack of Transportation (Non-Medical): No  Physical Activity: Sufficiently Active (06/17/2023)   Exercise Vital Sign    Days of Exercise per Week: 7 days    Minutes of Exercise per Session:  60 min  Recent Concern: Physical Activity - Inactive (04/07/2023)   Exercise Vital Sign    Days of  Exercise per Week: 0 days    Minutes of Exercise per Session: 0 min  Stress: No Stress Concern Present (06/17/2023)   [employer] of Occupational Health - Occupational Stress Questionnaire    Feeling of Stress : Not at all  Social Connections: Moderately Integrated (04/07/2023)   Social Connection and Isolation Panel [NHANES]    Frequency of Communication with Friends and Family: More than three times a week    Frequency of Social Gatherings with Friends and Family: More than three times a week    Attends Religious Services: More than 4 times per year    Active Member of [employer] or Organizations: No    Attends Engineer, structural: Never    Marital Status: Married    Objective:  BP 138/62 (BP Location: Left Arm, Patient Position: Sitting)   Pulse 72   Temp 97.8 F (36.6 C) (Temporal)   Ht 5\' 11"  (1.803 m)   Wt 239 lb (108.4 kg)   SpO2 98%   BMI 33.33 kg/m      10/07/2023    8:33 AM 09/04/2023   10:24 AM 08/04/2023    9:01 AM  BP/Weight  Systolic BP 138 130 122  Diastolic BP 62 82 78  Wt. (Lbs) 239 238 235  BMI 33.33 kg/m2 33.19 kg/m2 32.78 kg/m2    Physical Exam Vitals reviewed.  Constitutional:      General: He is not in acute distress.    Appearance: Normal appearance.  Eyes:     Conjunctiva/sclera: Conjunctivae normal.  Neck:     Vascular: No carotid bruit.  Cardiovascular:     Rate and Rhythm: Normal rate and regular rhythm.     Heart sounds: Normal heart sounds. No murmur heard. Pulmonary:     Effort: Pulmonary effort is normal.     Breath sounds: Normal breath sounds. No wheezing.  Abdominal:     General: Bowel sounds are normal.     Palpations: Abdomen is soft.     Tenderness: There is no abdominal tenderness.  Musculoskeletal:        General: Normal range of motion.     Cervical back: Normal range of motion.  Skin:    General: Skin is warm.  Neurological:     Mental Status: He is alert. Mental status is at baseline.  Psychiatric:         Mood and Affect: Mood normal.        Behavior: Behavior normal.     Lab Results  Component Value Date   WBC 10.0 07/28/2023   HGB 11.9 (L) 07/28/2023   HCT 39.3 07/28/2023   PLT 308 07/28/2023   GLUCOSE 92 07/28/2023   CHOL 218 (H) 07/28/2023   TRIG 145 07/28/2023   HDL 44 07/28/2023   LDLCALC 148 (H) 07/28/2023   ALT 14 07/28/2023   AST 16 07/28/2023   NA 132 (L) 07/28/2023   K 5.0 07/28/2023   CL 95 (L) 07/28/2023   CREATININE 1.11 07/28/2023   BUN 12 07/28/2023   CO2 23 07/28/2023   TSH 1.480 06/25/2023   INR 1.11 05/20/2017   HGBA1C 6.1 (H) 07/28/2023      Assessment & Plan:    Hypertensive heart disease without heart failure Assessment & Plan: Well controlled with Lisinopril 20mg  daily and Amlodipine 5mg  daily. No reported side effects. BP Readings from Last 3 Encounters:  10/07/23 138/62  09/04/23 130/82  08/04/23 122/78    -Continue Lisinopril 20mg  daily and Amlodipine 5mg  daily. -labs drawn today   Orders: -     Comprehensive metabolic panel -     CBC with Differential/Platelet  Mixed hyperlipidemia Assessment & Plan: On Pravastatin and Zetia. No reported side effects. Lab Results  Component Value Date   LDLCALC 148 (H) 07/28/2023  -Continue Pravastatin and Zetia. -Labs drawn today  Orders: -     Lipid panel  Prediabetes Assessment & Plan: A1C - 6.1% - Pre-diabetes -Patient wants to control this with diet and exercise rather than taking another medication.  -Continue to work on diet and exercise Labs drawn today, Await labs/testing for assessment and recommendations   Orders: -     Hemoglobin A1c  Idiopathic peripheral neuropathy Assessment & Plan: Neuropathy Well controlled with Gabapentin 300mg  twice daily. No reported side effects. -Continue Gabapentin 300mg  twice daily.   Iron deficiency anemia, unspecified iron deficiency anemia type Assessment & Plan: Last Hemoglobin 11.9 -Ferrous sulfate - 325 mg once daily Take iron  pills when your stomach is empty. If you cannot handle this, take them with food. Do not drink milk or take antacids at the same time as your iron pills. Iron pills may turn your poop (stool)black. Labs drawn today   Gastroesophageal reflux disease without esophagitis Assessment & Plan: The current medical regimen is effective;  continue present plan and medications.  Pantoprazole 40 mg daily   Impotence Assessment & Plan: Patient reports inability to have sex. Discussed potential side effects of Lisinopril. Patient is not interested in treatment. -No changes to current treatment plan.      No orders of the defined types were placed in this encounter.   Orders Placed This Encounter  Procedures   Comprehensive metabolic panel   Lipid panel   CBC with Differential/Platelet   Hemoglobin A1c     Follow-up: Return in about 3 months (around 01/04/2024) for chronic.  An After Visit Summary was printed and given to the patient.  Total time spent on today's visit was 42 minutes, including both face-to-face time and nonface-to-face time personally spent on review of chart (labs and imaging), discussing labs and goals, discussing further work-up, treatment options, referrals to specialist if needed, reviewing outside records if pertinent, answering patient's questions, and coordinating care.    Lajuana Matte, FNP [facility] [phone]

## 2023-10-07 NOTE — Assessment & Plan Note (Signed)
Well controlled with Lisinopril 20mg  daily and Amlodipine 5mg  daily. No reported side effects. BP Readings from Last 3 Encounters:  10/07/23 138/62  09/04/23 130/82  08/04/23 122/78    -Continue Lisinopril 20mg  daily and Amlodipine 5mg  daily. -labs drawn today

## 2023-10-07 NOTE — Assessment & Plan Note (Signed)
Neuropathy Well controlled with Gabapentin 300mg  twice daily. No reported side effects. -Continue Gabapentin 300mg  twice daily.

## 2023-10-07 NOTE — Assessment & Plan Note (Signed)
Last Hemoglobin 11.9 -Ferrous sulfate - 325 mg once daily Take iron pills when your stomach is empty. If you cannot handle this, take them with food. Do not drink milk or take antacids at the same time as your iron pills. Iron pills may turn your poop (stool)black. Labs drawn today

## 2023-10-07 NOTE — Assessment & Plan Note (Signed)
The current medical regimen is effective;  continue present plan and medications. Pantoprazole 40 mg daily. 

## 2023-10-08 ENCOUNTER — Ambulatory Visit: Payer: Medicare Other | Admitting: Family Medicine

## 2023-10-08 LAB — CBC WITH DIFFERENTIAL/PLATELET
Basophils Absolute: 0.1 10*3/uL (ref 0.0–0.2)
Basos: 1 %
EOS (ABSOLUTE): 0.2 10*3/uL (ref 0.0–0.4)
Eos: 2 %
Hematocrit: 45.2 % (ref 37.5–51.0)
Hemoglobin: 13.9 g/dL (ref 13.0–17.7)
Immature Grans (Abs): 0 10*3/uL (ref 0.0–0.1)
Immature Granulocytes: 0 %
Lymphocytes Absolute: 2.1 10*3/uL (ref 0.7–3.1)
Lymphs: 24 %
MCH: 26 pg — ABNORMAL LOW (ref 26.6–33.0)
MCHC: 30.8 g/dL — ABNORMAL LOW (ref 31.5–35.7)
MCV: 85 fL (ref 79–97)
Monocytes Absolute: 0.7 10*3/uL (ref 0.1–0.9)
Monocytes: 8 %
Neutrophils Absolute: 5.8 10*3/uL (ref 1.4–7.0)
Neutrophils: 65 %
Platelets: 250 10*3/uL (ref 150–450)
RBC: 5.34 x10E6/uL (ref 4.14–5.80)
RDW: 16.5 % — ABNORMAL HIGH (ref 11.6–15.4)
WBC: 8.9 10*3/uL (ref 3.4–10.8)

## 2023-10-08 LAB — COMPREHENSIVE METABOLIC PANEL
ALT: 17 [IU]/L (ref 0–44)
AST: 22 [IU]/L (ref 0–40)
Albumin: 4.1 g/dL (ref 3.8–4.8)
Alkaline Phosphatase: 130 [IU]/L — ABNORMAL HIGH (ref 44–121)
BUN/Creatinine Ratio: 10 (ref 10–24)
BUN: 10 mg/dL (ref 8–27)
Bilirubin Total: 0.5 mg/dL (ref 0.0–1.2)
CO2: 22 mmol/L (ref 20–29)
Calcium: 9.3 mg/dL (ref 8.6–10.2)
Chloride: 94 mmol/L — ABNORMAL LOW (ref 96–106)
Creatinine, Ser: 0.96 mg/dL (ref 0.76–1.27)
Globulin, Total: 2.7 g/dL (ref 1.5–4.5)
Glucose: 91 mg/dL (ref 70–99)
Potassium: 5.3 mmol/L — ABNORMAL HIGH (ref 3.5–5.2)
Sodium: 132 mmol/L — ABNORMAL LOW (ref 134–144)
Total Protein: 6.8 g/dL (ref 6.0–8.5)
eGFR: 81 mL/min/{1.73_m2} (ref >59)

## 2023-10-08 LAB — HEMOGLOBIN A1C
Est. average glucose Bld gHb Est-mCnc: 120 mg/dL
Hgb A1c MFr Bld: 5.8 % — ABNORMAL HIGH (ref 4.8–5.6)

## 2023-10-08 LAB — LIPID PANEL
Chol/HDL Ratio: 3.2 {ratio} (ref 0.0–5.0)
Cholesterol, Total: 166 mg/dL (ref 100–199)
HDL: 52 mg/dL (ref >39)
LDL Chol Calc (NIH): 96 mg/dL (ref 0–99)
Triglycerides: 99 mg/dL (ref 0–149)
VLDL Cholesterol Cal: 18 mg/dL (ref 5–40)

## 2023-10-29 ENCOUNTER — Other Ambulatory Visit: Payer: Self-pay | Admitting: Family Medicine

## 2023-10-29 DIAGNOSIS — D509 Iron deficiency anemia, unspecified: Secondary | ICD-10-CM

## 2023-10-30 ENCOUNTER — Other Ambulatory Visit: Payer: Self-pay | Admitting: Family Medicine

## 2023-10-31 ENCOUNTER — Other Ambulatory Visit: Payer: Self-pay | Admitting: Cardiovascular Disease

## 2023-11-05 ENCOUNTER — Encounter: Payer: Self-pay | Admitting: Cardiology

## 2023-11-10 ENCOUNTER — Ambulatory Visit: Payer: PPO | Attending: Cardiology | Admitting: Cardiology

## 2023-11-10 ENCOUNTER — Encounter: Payer: Self-pay | Admitting: Cardiology

## 2023-11-10 VITALS — BP 142/84 | HR 94 | Ht 66.0 in | Wt 236.4 lb

## 2023-11-10 DIAGNOSIS — J4489 Other specified chronic obstructive pulmonary disease: Secondary | ICD-10-CM

## 2023-11-10 DIAGNOSIS — E782 Mixed hyperlipidemia: Secondary | ICD-10-CM

## 2023-11-10 DIAGNOSIS — I25118 Atherosclerotic heart disease of native coronary artery with other forms of angina pectoris: Secondary | ICD-10-CM

## 2023-11-10 DIAGNOSIS — I119 Hypertensive heart disease without heart failure: Secondary | ICD-10-CM

## 2023-11-10 DIAGNOSIS — I10 Essential (primary) hypertension: Secondary | ICD-10-CM

## 2023-11-10 DIAGNOSIS — R7303 Prediabetes: Secondary | ICD-10-CM | POA: Diagnosis not present

## 2023-11-10 NOTE — Progress Notes (Signed)
 Cardiology Office Note:    Date:  11/10/2023   ID:  Sultan Pargas, DOB 1945-10-06, MRN [id number]  PCP:  Blane Ohara, MD  Cardiologist:  Gypsy Balsam, MD    Referring MD: Blane Ohara, MD   No chief complaint on file.   History of Present Illness:    Joseph Mercado is a 78 y.o. male  with past medical history significant for essential hypertension, coronary disease in June 2018 he had cardiac catheterization done he was found to have complete occlusion of circumflex artery with collateralization for occluded large obtuse marginal. PCI was attempted however we unable to cross the lesion. Then a second attempt was made in September 2018 procedure was complicated by pericardial effusion that required pericardiocentesis more than 500 cc of blood has been removed. After that patient has been stabilized however shortly thereafter drain has been removed he end up having problems again and then eventually pericardial window has been done. Since that time he seems to be doing fine additional problem include essential hypertension, dyslipidemia.  Comes today to months for follow-up.  Overall doing great.  Asymptomatic, denies have any chest pain tightness squeezing pressure burning chest no palpitations dizziness swelling of lower extremities  Past Medical History:  Diagnosis Date   Anemia 05/20/2017   Angina pectoris (HCC)    CAD (coronary artery disease)    a. 02/2017: NSTEMI with cath showing 100% Prox Cx stenosis with 95% Mid Cx stenosis and 45% mid-LAD stenosis. Unsuccessful attempt at crossing an occluded mid nondominant circumflex --> plan for staged PCI in coming weeks   Cardiac/pericardial tamponade 05/14/2017   Chest pain 02/2017   Chronic gouty arthritis 10/10/2019   Cognitive deficit following cerebrovascular accident (CVA) 05/30/2022   Coronary artery disease of native artery of native heart with stable angina pectoris (HCC) 10/07/2022   Dyspnea    Dyspnea on exertion 02/05/2017    Dyspnea on exertion     Elevated glucose level 07/28/2023   Encephalopathy acute 05/30/2022   GERD (gastroesophageal reflux disease)    High cholesterol    History of gout    Hypertension    Hypertensive heart disease 03/11/2017   Idiopathic peripheral neuropathy 07/28/2023   Impaired balance as late effect of cerebrovascular accident (CVA) 10/07/2022   Impotence 10/10/2019   Late effect of cerebrovascular accident (CVA) 10/04/2022   Lumbar pain 04/08/2023   Macular degeneration    Medicare annual wellness visit, subsequent 06/17/2023   Mixed hyperlipidemia 03/11/2017   Morbid obesity (HCC) 03/27/2021   Myalgia, multiple sites 06/25/2023   Neuropathy 08/04/2023   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   NSTEMI (non-ST elevated myocardial infarction) (HCC) 02/2017   Numbness and tingling of both lower extremities 06/25/2023   Obstructive chronic bronchitis without exacerbation (HCC) 10/10/2019   Pain in both lower legs 07/28/2023   Pericardial effusion 04/2017   Pericardial tamponade    Prediabetes 08/04/2023   Right-sided extracranial carotid artery stenosis 05/30/2022   Screening for lung cancer 04/08/2023   Vision changes 06/25/2023    Past Surgical History:  Procedure Laterality Date   CATARACT EXTRACTION W/ INTRAOCULAR LENS  IMPLANT, BILATERAL Bilateral    CORONARY ANGIOPLASTY     CORONARY BALLOON ANGIOPLASTY N/A 03/10/2017   Procedure: Coronary Balloon Angioplasty;  Surgeon: Runell Gess, MD;  Location: [facility] INVASIVE CV LAB;  Service: Cardiovascular;  Laterality: N/A;  CFX   CORONARY CTO INTERVENTION N/A 05/14/2017   Procedure: CORONARY CTO INTERVENTION;  Surgeon: Swaziland, Peter M, MD;  Location:  [facility] INVASIVE CV LAB;  Service: Cardiovascular;  Laterality: N/A;   LEFT HEART CATH AND CORONARY ANGIOGRAPHY N/A 03/10/2017   Procedure: Left Heart Cath and Coronary Angiography;  Surgeon: Runell Gess, MD;  Location: [facility] INVASIVE CV LAB;   Service: Cardiovascular;  Laterality: N/A;   PERICARDIOCENTESIS N/A 05/14/2017   Procedure: PERICARDIOCENTESIS;  Surgeon: Swaziland, Peter M, MD;  Location: [facility] INVASIVE CV LAB;  Service: Cardiovascular;  Laterality: N/A;   SUBXYPHOID PERICARDIAL WINDOW N/A 05/15/2017   Procedure: SUBXYPHOID PERICARDIAL WINDOW WITH DRAINAGE OF PERICARDIAL EFFUSION;  Surgeon: Alleen Borne, MD;  Location: [facility] OR;  Service: Thoracic;  Laterality: N/A;   ULTRASOUND GUIDANCE FOR VASCULAR ACCESS  05/14/2017   Procedure: Ultrasound Guidance For Vascular Access;  Surgeon: Swaziland, Peter M, MD;  Location: [facility] INVASIVE CV LAB;  Service: Cardiovascular;;    Current Medications: Current Meds  Medication Sig   allopurinol (ZYLOPRIM) 100 MG tablet TAKE 1 TABLET(100 MG) BY MOUTH TWICE DAILY AS NEEDED FOR PAIN   amLODipine (NORVASC) 5 MG tablet TAKE 1 TABLET(5 MG) BY MOUTH DAILY   aspirin EC 81 MG tablet Take 1 tablet (81 mg total) by mouth daily. Resume on 05/20/17.   FEROSUL 325 (65 Fe) MG tablet TAKE 1 TABLET BY MOUTH DAILY   gabapentin (NEURONTIN) 300 MG capsule Take 1 capsule (300 mg total) by mouth 2 (two) times daily.   ibuprofen (ADVIL) 600 MG tablet Take 1 tablet (600 mg total) by mouth every 8 (eight) hours as needed.   isosorbide mononitrate (IMDUR) 60 MG 24 hr tablet TAKE 1 TABLET(60 MG) BY MOUTH DAILY   lisinopril (ZESTRIL) 20 MG tablet TAKE 1 TABLET(20 MG) BY MOUTH DAILY   losartan (COZAAR) 50 MG tablet TAKE 1 TABLET(50 MG) BY MOUTH AT BEDTIME   nitroGLYCERIN (NITROSTAT) 0.4 MG SL tablet Place 1 tablet (0.4 mg total) under the tongue every 5 (five) minutes as needed for chest pain.   pantoprazole (PROTONIX) 40 MG tablet TAKE 1 TABLET(40 MG) BY MOUTH DAILY   pravastatin (PRAVACHOL) 20 MG tablet Take 1 tablet (20 mg total) by mouth daily.     Allergies:   Nexlizet [bempedoic acid-ezetimibe] and Prednisone   Social History   Socioeconomic History   Marital status: Divorced    Spouse name: Not on file   Number of  children: Not on file   Years of education: Not on file   Highest education level: Not on file  Occupational History   Occupation: Retired Proofreader  Tobacco Use   Smoking status: Former    Current packs/day: 0.00    Average packs/day: 3.0 packs/day for 20.0 years (60.0 ttl pk-yrs)    Types: Cigarettes    Quit date: 1985    Years since quitting: 40.2   Smokeless tobacco: Former    Types: Chew   Tobacco comments:    "quit smoking in the 1980s; chewed 4-5 years after I quit smoking"  Vaping Use   Vaping status: Never Used  Substance and Sexual Activity   Alcohol use: Yes    Alcohol/week: 1.0 standard drink of alcohol    Types: 1 Cans of beer per week    Comment: 05/14/2017 "6-10 beers/day"  06/11/22 - 2016 cut down to three beers a day, has not had any alcohol in the past 6 weeks   Drug use: No   Sexual activity: Not on file  Other Topics Concern   Not on file  Social History Narrative   Not on file   Social Drivers of  Health   Financial Resource Strain: Low Risk  (06/17/2023)   Overall Financial Resource Strain (CARDIA)    Difficulty of Paying Living Expenses: Not hard at all  Food Insecurity: No Food Insecurity (06/17/2023)   Hunger Vital Sign    Worried About Running Out of Food in the Last Year: Never true    Ran Out of Food in the Last Year: Never true  Transportation Needs: No Transportation Needs (06/17/2023)   PRAPARE - Administrator, Civil Service (Medical): No    Lack of Transportation (Non-Medical): No  Physical Activity: Sufficiently Active (06/17/2023)   Exercise Vital Sign    Days of Exercise per Week: 7 days    Minutes of Exercise per Session: 60 min  Recent Concern: Physical Activity - Inactive (04/07/2023)   Exercise Vital Sign    Days of Exercise per Week: 0 days    Minutes of Exercise per Session: 0 min  Stress: No Stress Concern Present (06/17/2023)   [employer] of Occupational Health - Occupational Stress Questionnaire    Feeling  of Stress : Not at all  Social Connections: Moderately Integrated (04/07/2023)   Social Connection and Isolation Panel [NHANES]    Frequency of Communication with Friends and Family: More than three times a week    Frequency of Social Gatherings with Friends and Family: More than three times a week    Attends Religious Services: More than 4 times per year    Active Member of [employer] or Organizations: No    Attends Banker Meetings: Never    Marital Status: Married     Family History: The patient's family history includes CAD in his brother. ROS:   Please see the history of present illness.    All 14 point review of systems negative except as described per history of present illness  EKGs/Labs/Other Studies Reviewed:    EKG Interpretation Date/Time:  Monday November 10 2023 11:03:44 EDT Ventricular Rate:  94 PR Interval:  216 QRS Duration:  94 QT Interval:  332 QTC Calculation: 415 R Axis:   270  Text Interpretation: Sinus rhythm with 1st degree A-V block Low voltage QRS Incomplete right bundle branch block Possible Anterolateral infarct , age undetermined Abnormal ECG When compared with ECG of 20-May-2017 00:10, PREVIOUS ECG IS PRESENT Confirmed by Gypsy Balsam [phone]) on 11/10/2023 11:18:32 AM    Recent Labs: 06/25/2023: Magnesium 2.3; TSH 1.480 10/07/2023: ALT 17; BUN 10; Creatinine, Ser 0.96; Hemoglobin 13.9; Platelets 250; Potassium 5.3; Sodium 132  Recent Lipid Panel    Component Value Date/Time   CHOL 166 10/07/2023 0855   TRIG 99 10/07/2023 0855   HDL 52 10/07/2023 0855   CHOLHDL 3.2 10/07/2023 0855   CHOLHDL 3.3 03/11/2017 0214   VLDL 14 03/11/2017 0214   LDLCALC 96 10/07/2023 0855    Physical Exam:    VS:  BP (!) 142/84   Pulse 94   Ht 5\' 6"  (1.676 m)   Wt 236 lb 6.4 oz (107.2 kg)   SpO2 96%   BMI 38.16 kg/m     Wt Readings from Last 3 Encounters:  11/10/23 236 lb 6.4 oz (107.2 kg)  10/07/23 239 lb (108.4 kg)  09/04/23 238 lb (108 kg)      GEN:  Well nourished, well developed in no acute distress HEENT: Normal NECK: No JVD; No carotid bruits LYMPHATICS: No lymphadenopathy CARDIAC: RRR, no murmurs, no rubs, no gallops RESPIRATORY:  Clear to auscultation without rales, wheezing or rhonchi  ABDOMEN:  Soft, non-tender, non-distended MUSCULOSKELETAL:  No edema; No deformity  SKIN: Warm and dry LOWER EXTREMITIES: no swelling NEUROLOGIC:  Alert and oriented x 3 PSYCHIATRIC:  Normal affect   ASSESSMENT:    1. Essential hypertension   2. Coronary artery disease of native artery of native heart with stable angina pectoris (HCC)   3. Hypertensive heart disease without heart failure   4. Obstructive chronic bronchitis without exacerbation (HCC)   5. Mixed hyperlipidemia   6. Prediabetes    PLAN:    In order of problems listed above:  Coronary disease.  Stable from that point review denies have any signs and symptoms that would indicate reactivation of the problem. Essential hypertension blood pressure always slightly on the high side while in the office but he check it at home always good.  I initiated conversation about potentially augmenting his medications he does not want to do it he said he feels great he does not want to change any of medications. Dyslipidemia he takes pravastatin 20 twice daily seems to be tolerating well but C [place] is still not where it supposed to be.  LDL 96 HDL 52 we try Zetia he could not tolerate this.  Will continue present management I initiated conversation about potentially starting some extra medication he absolutely does not want to add any thing to his medications.   Medication Adjustments/Labs and Tests Ordered: Current medicines are reviewed at length with the patient today.  Concerns regarding medicines are outlined above.  Orders Placed This Encounter  Procedures   EKG 12-Lead   Medication changes: No orders of the defined types were placed in this  encounter.   Signed, Georgeanna Lea, MD, [facility] 11/10/2023 11:50 AM    [facility]

## 2023-11-10 NOTE — Patient Instructions (Signed)

## 2023-11-11 DIAGNOSIS — L578 Other skin changes due to chronic exposure to nonionizing radiation: Secondary | ICD-10-CM | POA: Diagnosis not present

## 2023-11-11 DIAGNOSIS — L820 Inflamed seborrheic keratosis: Secondary | ICD-10-CM | POA: Diagnosis not present

## 2023-11-11 DIAGNOSIS — L821 Other seborrheic keratosis: Secondary | ICD-10-CM | POA: Diagnosis not present

## 2023-11-11 DIAGNOSIS — L570 Actinic keratosis: Secondary | ICD-10-CM | POA: Diagnosis not present

## 2023-11-26 ENCOUNTER — Other Ambulatory Visit: Payer: Self-pay | Admitting: Family Medicine

## 2023-11-26 DIAGNOSIS — I10 Essential (primary) hypertension: Secondary | ICD-10-CM

## 2023-12-29 ENCOUNTER — Telehealth: Payer: Self-pay

## 2023-12-29 NOTE — Telephone Encounter (Signed)
 Spoke with Antony Baumgartner from [facility] and made her aware that in the note from February it stated that the patient has Hypertensive heart disease without heart failure.   Copied from CRM [id number]. Topic: Clinical - Medical Advice >> Dec 29, 2023  2:24 PM Rennis Case wrote: Reason for CRM: Antony Baumgartner, Nurse Case Manager w/ [employer] is calling to confirm patient has a diagnosis of congestive heart failure.   Received records back in February however in their chart records they are not able to confirm the diagnosis. Requesting call back to discuss, [phone]

## 2024-01-07 ENCOUNTER — Other Ambulatory Visit: Payer: Self-pay | Admitting: Family Medicine

## 2024-01-24 ENCOUNTER — Other Ambulatory Visit: Payer: Self-pay | Admitting: Family Medicine

## 2024-01-24 DIAGNOSIS — D509 Iron deficiency anemia, unspecified: Secondary | ICD-10-CM

## 2024-01-25 ENCOUNTER — Other Ambulatory Visit: Payer: Self-pay | Admitting: Family Medicine

## 2024-01-25 DIAGNOSIS — G609 Hereditary and idiopathic neuropathy, unspecified: Secondary | ICD-10-CM

## 2024-02-25 ENCOUNTER — Other Ambulatory Visit: Payer: Self-pay | Admitting: Family Medicine

## 2024-02-25 DIAGNOSIS — G629 Polyneuropathy, unspecified: Secondary | ICD-10-CM

## 2024-03-03 ENCOUNTER — Encounter: Payer: Self-pay | Admitting: Family Medicine

## 2024-03-03 ENCOUNTER — Ambulatory Visit (INDEPENDENT_AMBULATORY_CARE_PROVIDER_SITE_OTHER): Admitting: Family Medicine

## 2024-03-03 VITALS — BP 112/60 | HR 79 | Temp 98.3°F | Resp 16 | Ht 66.0 in | Wt 237.0 lb

## 2024-03-03 DIAGNOSIS — L959 Vasculitis limited to the skin, unspecified: Secondary | ICD-10-CM | POA: Diagnosis not present

## 2024-03-03 DIAGNOSIS — R21 Rash and other nonspecific skin eruption: Secondary | ICD-10-CM | POA: Diagnosis not present

## 2024-03-03 HISTORY — DX: Vasculitis limited to the skin, unspecified: L95.9

## 2024-03-03 MED ORDER — PIMECROLIMUS 1 % EX CREA: TOPICAL_CREAM | Freq: Two times a day (BID) | CUTANEOUS | 0 refills | Status: AC

## 2024-03-03 NOTE — Assessment & Plan Note (Signed)
 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 Occurs after prolonged activity in hot, humid conditions. Not itchy or painful. Elidel  cream chosen due to lack of systemic side effects. - Prescribe Elidel  cream for topical application. - Advise application of cool washcloths to affected area. - Instruct to maintain hydration and elevate legs. - Advise against wearing compression socks during walking. - Send prescription to [facility] pharmacy in [place].

## 2024-03-03 NOTE — Progress Notes (Signed)
 Acute Office Visit  Subjective:    Patient ID: Joseph Mercado, male    DOB: 1945/08/24, 78 y.o.   MRN: [id number]  Chief Complaint  Patient presents with   Rash    Discussed the use of AI scribe software for clinical note transcription with the patient, who gave verbal consent to proceed.  History of Present Illness   The patient is an 78 year old who presents with a rash on both legs.  The rash began on Monday and is located on both legs without itching or burning. Initially, they sought care at an urgent care facility but were advised to see their regular doctor. They have been using over-the-counter Benadryl and cortisone cream.  They report wearing compression socks during prolonged walking, which they increased to two and a half miles a day starting last Saturday. They have since switched to wearing different socks. They walk early in the morning and change into shorts and lighter socks after showering post-walk. Denies itching or burning associated with the rash. No stings or exposure to known irritants. No similar rashes in the past.   They have not experienced a rash like this before and have not been diagnosed with stasis dermatitis. They recall a previous rash on their neck for which they were prescribed steroids, but they experienced hearing loss as a side effect and are unable to take oral steroids now.  They are compliant with their medications and report that their feet, which previously caused pain, are now improved. Denies any fever.   Past Medical History:  Diagnosis Date   Anemia 05/20/2017   Angina pectoris (HCC)    CAD (coronary artery disease)    a. 02/2017: NSTEMI with cath showing 100% Prox Cx stenosis with 95% Mid Cx stenosis and 45% mid-LAD stenosis. Unsuccessful attempt at crossing an occluded mid nondominant circumflex --> plan for staged PCI in coming weeks   Cardiac/pericardial tamponade 05/14/2017   Chest pain 02/2017   Chronic gouty arthritis 10/10/2019    Cognitive deficit following cerebrovascular accident (CVA) 05/30/2022   Coronary artery disease of native artery of native heart with stable angina pectoris (HCC) 10/07/2022   Dyspnea    Dyspnea on exertion 02/05/2017   Dyspnea on exertion     Elevated glucose level 07/28/2023   Encephalopathy acute 05/30/2022   GERD (gastroesophageal reflux disease)    High cholesterol    History of gout    Hypertension    Hypertensive heart disease 03/11/2017   Idiopathic peripheral neuropathy 07/28/2023   Impaired balance as late effect of cerebrovascular accident (CVA) 10/07/2022   Impotence 10/10/2019   Late effect of cerebrovascular accident (CVA) 10/04/2022   Lumbar pain 04/08/2023   Macular degeneration    Medicare annual wellness visit, subsequent 06/17/2023   Mixed hyperlipidemia 03/11/2017   Morbid obesity (HCC) 03/27/2021   Myalgia, multiple sites 06/25/2023   Neuropathy 08/04/2023   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   NSTEMI (non-ST elevated myocardial infarction) (HCC) 02/2017   Numbness and tingling of both lower extremities 06/25/2023   Obstructive chronic bronchitis without exacerbation (HCC) 10/10/2019   Pain in both lower legs 07/28/2023   Pericardial effusion 04/2017   Pericardial tamponade    Prediabetes 08/04/2023   Right-sided extracranial carotid artery stenosis 05/30/2022   Screening for lung cancer 04/08/2023   Vision changes 06/25/2023    Past Surgical History:  Procedure Laterality Date   CATARACT EXTRACTION W/ INTRAOCULAR LENS  IMPLANT, BILATERAL Bilateral    CORONARY ANGIOPLASTY  CORONARY BALLOON ANGIOPLASTY N/A 03/10/2017   Procedure: Coronary Balloon Angioplasty;  Surgeon: Court Dorn PARAS, MD;  Location: [facility] INVASIVE CV LAB;  Service: Cardiovascular;  Laterality: N/A;  CFX   CORONARY CTO INTERVENTION N/A 05/14/2017   Procedure: CORONARY CTO INTERVENTION;  Surgeon: Swaziland, Peter M, MD;  Location: [facility] INVASIVE CV LAB;   Service: Cardiovascular;  Laterality: N/A;   LEFT HEART CATH AND CORONARY ANGIOGRAPHY N/A 03/10/2017   Procedure: Left Heart Cath and Coronary Angiography;  Surgeon: Court Dorn PARAS, MD;  Location: [facility] INVASIVE CV LAB;  Service: Cardiovascular;  Laterality: N/A;   PERICARDIOCENTESIS N/A 05/14/2017   Procedure: PERICARDIOCENTESIS;  Surgeon: Swaziland, Peter M, MD;  Location: [facility] INVASIVE CV LAB;  Service: Cardiovascular;  Laterality: N/A;   SUBXYPHOID PERICARDIAL WINDOW N/A 05/15/2017   Procedure: SUBXYPHOID PERICARDIAL WINDOW WITH DRAINAGE OF PERICARDIAL EFFUSION;  Surgeon: Lucas Dorise POUR, MD;  Location: [facility] OR;  Service: Thoracic;  Laterality: N/A;   ULTRASOUND GUIDANCE FOR VASCULAR ACCESS  05/14/2017   Procedure: Ultrasound Guidance For Vascular Access;  Surgeon: Swaziland, Peter M, MD;  Location: [facility] INVASIVE CV LAB;  Service: Cardiovascular;;    Family History  Problem Relation Age of Onset   CAD Brother        CABG in his 46's    Social History   Socioeconomic History   Marital status: Divorced    Spouse name: Not on file   Number of children: Not on file   Years of education: Not on file   Highest education level: Not on file  Occupational History   Occupation: Retired Proofreader  Tobacco Use   Smoking status: Former    Current packs/day: 0.00    Average packs/day: 3.0 packs/day for 20.0 years (60.0 ttl pk-yrs)    Types: Cigarettes    Quit date: 1985    Years since quitting: 40.5   Smokeless tobacco: Former    Types: Chew   Tobacco comments:    quit smoking in the 1980s; chewed 4-5 years after I quit smoking  Vaping Use   Vaping status: Never Used  Substance and Sexual Activity   Alcohol use: Yes    Alcohol/week: 1.0 standard drink of alcohol    Types: 1 Cans of beer per week    Comment: 05/14/2017 6-10 beers/day  06/11/22 - 2016 cut down to three beers a day, has not had any alcohol in the past 6 weeks   Drug use: No   Sexual activity: Not on file  Other Topics Concern    Not on file  Social History Narrative   Not on file   Social Drivers of Health   Financial Resource Strain: Low Risk  (06/17/2023)   Overall Financial Resource Strain (CARDIA)    Difficulty of Paying Living Expenses: Not hard at all  Food Insecurity: No Food Insecurity (06/17/2023)   Hunger Vital Sign    Worried About Running Out of Food in the Last Year: Never true    Ran Out of Food in the Last Year: Never true  Transportation Needs: No Transportation Needs (06/17/2023)   PRAPARE - Administrator, Civil Service (Medical): No    Lack of Transportation (Non-Medical): No  Physical Activity: Sufficiently Active (06/17/2023)   Exercise Vital Sign    Days of Exercise per Week: 7 days    Minutes of Exercise per Session: 60 min  Recent Concern: Physical Activity - Inactive (04/07/2023)   Exercise Vital Sign    Days of Exercise per Week: 0 days  Minutes of Exercise per Session: 0 min  Stress: No Stress Concern Present (06/17/2023)   [employer] of Occupational Health - Occupational Stress Questionnaire    Feeling of Stress : Not at all  Social Connections: Moderately Integrated (04/07/2023)   Social Connection and Isolation Panel    Frequency of Communication with Friends and Family: More than three times a week    Frequency of Social Gatherings with Friends and Family: More than three times a week    Attends Religious Services: More than 4 times per year    Active Member of [employer] or Organizations: No    Attends Banker Meetings: Never    Marital Status: Married  Catering manager Violence: Not At Risk (06/17/2023)   Humiliation, Afraid, Rape, and Kick questionnaire    Fear of Current or Ex-Partner: No    Emotionally Abused: No    Physically Abused: No    Sexually Abused: No    Outpatient Medications Prior to Visit  Medication Sig Dispense Refill   allopurinol (ZYLOPRIM) 100 MG tablet TAKE 1 TABLET(100 MG) BY MOUTH TWICE DAILY AS NEEDED FOR PAIN 180  tablet 1   amLODipine  (NORVASC ) 5 MG tablet TAKE 1 TABLET(5 MG) BY MOUTH DAILY 90 tablet 1   aspirin  EC 81 MG tablet Take 1 tablet (81 mg total) by mouth daily. Resume on 05/20/17. 30 tablet 12   ezetimibe  (ZETIA ) 10 MG tablet Take 10 mg by mouth daily.     FEROSUL 325 (65 Fe) MG tablet TAKE 1 TABLET BY MOUTH DAILY 30 tablet 2   gabapentin  (NEURONTIN ) 300 MG capsule TAKE 1 CAPSULE(300 MG) BY MOUTH TWICE DAILY 60 capsule 1   ibuprofen  (ADVIL ) 600 MG tablet Take 1 tablet (600 mg total) by mouth every 8 (eight) hours as needed. 90 tablet 1   isosorbide  mononitrate (IMDUR ) 60 MG 24 hr tablet TAKE 1 TABLET(60 MG) BY MOUTH DAILY 90 tablet 3   lisinopril  (ZESTRIL ) 20 MG tablet TAKE 1 TABLET(20 MG) BY MOUTH DAILY 90 tablet 1   losartan  (COZAAR ) 50 MG tablet TAKE 1 TABLET(50 MG) BY MOUTH AT BEDTIME 90 tablet 0   nitroGLYCERIN  (NITROSTAT ) 0.4 MG SL tablet Place 1 tablet (0.4 mg total) under the tongue every 5 (five) minutes as needed for chest pain. 25 tablet 6   pantoprazole  (PROTONIX ) 40 MG tablet TAKE 1 TABLET(40 MG) BY MOUTH DAILY 90 tablet 1   pravastatin (PRAVACHOL) 20 MG tablet TAKE 1 TABLET(20 MG) BY MOUTH DAILY 90 tablet 1   No facility-administered medications prior to visit.    Allergies  Allergen Reactions   Nexlizet  [Bempedoic Acid-Ezetimibe ] Other (See Comments)    Pain in legs from toes to knees   Prednisone      Hearing loss that did resolve    Review of Systems  Constitutional:  Negative for appetite change, fatigue and fever.  HENT:  Negative for congestion, ear pain, sinus pressure and sore throat.   Eyes: Negative.   Respiratory:  Negative for cough, chest tightness, shortness of breath and wheezing.   Cardiovascular:  Negative for chest pain and palpitations.  Gastrointestinal:  Negative for abdominal pain, constipation, diarrhea, nausea and vomiting.  Endocrine: Negative.   Genitourinary:  Negative for dysuria and hematuria.  Musculoskeletal:  Negative for arthralgias,  back pain, joint swelling and myalgias.  Skin:  Negative for rash.  Allergic/Immunologic: Negative.   Neurological:  Negative for dizziness, weakness and headaches.  Hematological: Negative.   Psychiatric/Behavioral:  Negative for dysphoric mood. The patient is  not nervous/anxious.        Objective:        03/03/2024    2:38 PM 11/10/2023   11:00 AM 10/07/2023    8:33 AM  Vitals with BMI  Height 5' 6 5' 6 5' 11  Weight 237 lbs 236 lbs 6 oz 239 lbs  BMI 38.27 38.17 33.35  Systolic 112 142 861  Diastolic 60 84 62  Pulse 79 94 72    No data found.   Physical Exam Vitals reviewed.  Constitutional:      General: He is not in acute distress.    Appearance: Normal appearance. He is not ill-appearing.  Eyes:     Conjunctiva/sclera: Conjunctivae normal.  Cardiovascular:     Rate and Rhythm: Normal rate and regular rhythm.     Heart sounds: Normal heart sounds. No murmur heard. Pulmonary:     Effort: Pulmonary effort is normal.     Breath sounds: Normal breath sounds. No wheezing.  Abdominal:     General: Bowel sounds are normal.     Palpations: Abdomen is soft.  Musculoskeletal:        General: No swelling or tenderness.  Skin:    General: Skin is warm.     Findings: Rash (non blanchable) present. Rash is papular. Rash is not crusting, nodular, scaling or vesicular.     Comments: See photos  Neurological:     Mental Status: He is alert. Mental status is at baseline.  Psychiatric:        Mood and Affect: Mood normal.        Behavior: Behavior normal.        Health Maintenance Due  Topic Date Due   COVID-19 Vaccine (5 - 2024-25 season) 04/20/2023    There are no preventive care reminders to display for this patient.   Lab Results  Component Value Date   TSH 1.480 06/25/2023   Lab Results  Component Value Date   WBC 8.9 10/07/2023   HGB 13.9 10/07/2023   HCT 45.2 10/07/2023   MCV 85 10/07/2023   PLT 250 10/07/2023   Lab Results  Component Value  Date   NA 132 (L) 10/07/2023   K 5.3 (H) 10/07/2023   CO2 22 10/07/2023   GLUCOSE 91 10/07/2023   BUN 10 10/07/2023   CREATININE 0.96 10/07/2023   BILITOT 0.5 10/07/2023   ALKPHOS 130 (H) 10/07/2023   AST 22 10/07/2023   ALT 17 10/07/2023   PROT 6.8 10/07/2023   ALBUMIN  4.1 10/07/2023   CALCIUM  9.3 10/07/2023   ANIONGAP 8 05/21/2017   EGFR 81 10/07/2023   Lab Results  Component Value Date   CHOL 166 10/07/2023   Lab Results  Component Value Date   HDL 52 10/07/2023   Lab Results  Component Value Date   LDLCALC 96 10/07/2023   Lab Results  Component Value Date   TRIG 99 10/07/2023   Lab Results  Component Value Date   CHOLHDL 3.2 10/07/2023   Lab Results  Component Value Date   HGBA1C 5.8 (H) 10/07/2023       Assessment & Plan:  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 Occurs after prolonged activity in hot, humid conditions. Not itchy or painful. Elidel  cream chosen due to lack of systemic side effects. - Prescribe Elidel  cream for topical application. - Advise application of cool washcloths to affected area. - Instruct to maintain hydration and elevate legs. -  Advise against wearing compression socks during walking. - Send prescription to [facility] pharmacy in [place].   Orders: -     Pimecrolimus ; Apply topically 2 (two) times daily.  Dispense: 30 g; Refill: 0    Meds ordered this encounter  Medications   pimecrolimus  (ELIDEL ) 1 % cream    Sig: Apply topically 2 (two) times daily.    Dispense:  30 g    Refill:  0    No orders of the defined types were placed in this encounter.    Follow-up: Return if symptoms worsen or fail to improve.  An After Visit Summary was printed and given to the patient.  Harrie Cedar, FNP [facility] [phone]

## 2024-03-25 ENCOUNTER — Other Ambulatory Visit: Payer: Self-pay | Admitting: Family Medicine

## 2024-03-25 DIAGNOSIS — G609 Hereditary and idiopathic neuropathy, unspecified: Secondary | ICD-10-CM

## 2024-03-30 ENCOUNTER — Other Ambulatory Visit: Payer: Self-pay

## 2024-03-30 ENCOUNTER — Telehealth: Payer: Self-pay

## 2024-03-30 DIAGNOSIS — I119 Hypertensive heart disease without heart failure: Secondary | ICD-10-CM

## 2024-03-30 DIAGNOSIS — R7303 Prediabetes: Secondary | ICD-10-CM

## 2024-03-30 DIAGNOSIS — D649 Anemia, unspecified: Secondary | ICD-10-CM

## 2024-03-30 DIAGNOSIS — E782 Mixed hyperlipidemia: Secondary | ICD-10-CM

## 2024-03-30 NOTE — Telephone Encounter (Signed)
 Appointment was made on 04/02/24 and orders are in.

## 2024-03-30 NOTE — Telephone Encounter (Signed)
 Copied from CRM [id number]. Topic: Clinical - Request for Lab/Test Order >> Mar 30, 2024 12:33 PM Montie POUR wrote: Reason for CRM:  Harrell wants to come in on 04/01/24 or 04/02/24 to complete labs before his appointment on 04/06/24. He would like to discuss lab results in his appointment. Please call him at [phone].

## 2024-04-02 ENCOUNTER — Other Ambulatory Visit

## 2024-04-02 DIAGNOSIS — D649 Anemia, unspecified: Secondary | ICD-10-CM

## 2024-04-02 DIAGNOSIS — E782 Mixed hyperlipidemia: Secondary | ICD-10-CM | POA: Diagnosis not present

## 2024-04-02 DIAGNOSIS — R7303 Prediabetes: Secondary | ICD-10-CM | POA: Diagnosis not present

## 2024-04-02 DIAGNOSIS — I119 Hypertensive heart disease without heart failure: Secondary | ICD-10-CM

## 2024-04-03 ENCOUNTER — Ambulatory Visit: Payer: Self-pay | Admitting: Family Medicine

## 2024-04-03 LAB — CBC WITH DIFFERENTIAL/PLATELET
Basophils Absolute: 0.1 x10E3/uL (ref 0.0–0.2)
Basos: 1 %
EOS (ABSOLUTE): 0.2 x10E3/uL (ref 0.0–0.4)
Eos: 2 %
Hematocrit: 47.3 % (ref 37.5–51.0)
Hemoglobin: 15.3 g/dL (ref 13.0–17.7)
Immature Grans (Abs): 0 x10E3/uL (ref 0.0–0.1)
Immature Granulocytes: 0 %
Lymphocytes Absolute: 2.1 x10E3/uL (ref 0.7–3.1)
Lymphs: 24 %
MCH: 31 pg (ref 26.6–33.0)
MCHC: 32.3 g/dL (ref 31.5–35.7)
MCV: 96 fL (ref 79–97)
Monocytes Absolute: 0.6 x10E3/uL (ref 0.1–0.9)
Monocytes: 7 %
Neutrophils Absolute: 5.7 x10E3/uL (ref 1.4–7.0)
Neutrophils: 66 %
Platelets: 215 x10E3/uL (ref 150–450)
RBC: 4.94 x10E6/uL (ref 4.14–5.80)
RDW: 13.1 % (ref 11.6–15.4)
WBC: 8.7 x10E3/uL (ref 3.4–10.8)

## 2024-04-03 LAB — COMPREHENSIVE METABOLIC PANEL WITH GFR
ALT: 22 IU/L (ref 0–44)
AST: 26 IU/L (ref 0–40)
Albumin: 4.2 g/dL (ref 3.8–4.8)
Alkaline Phosphatase: 103 IU/L (ref 44–121)
BUN/Creatinine Ratio: 9 — ABNORMAL LOW (ref 10–24)
BUN: 9 mg/dL (ref 8–27)
Bilirubin Total: 0.8 mg/dL (ref 0.0–1.2)
CO2: 22 mmol/L (ref 20–29)
Calcium: 9.1 mg/dL (ref 8.6–10.2)
Chloride: 97 mmol/L (ref 96–106)
Creatinine, Ser: 1.04 mg/dL (ref 0.76–1.27)
Globulin, Total: 2.4 g/dL (ref 1.5–4.5)
Glucose: 92 mg/dL (ref 70–99)
Potassium: 4.7 mmol/L (ref 3.5–5.2)
Sodium: 134 mmol/L (ref 134–144)
Total Protein: 6.6 g/dL (ref 6.0–8.5)
eGFR: 73 mL/min/1.73 (ref >59)

## 2024-04-03 LAB — IRON,TIBC AND FERRITIN PANEL
Ferritin: 106 ng/mL (ref 30–400)
Iron Saturation: 68 % — ABNORMAL HIGH (ref 15–55)
Iron: 158 ug/dL (ref 38–169)
Total Iron Binding Capacity: 231 ug/dL — ABNORMAL LOW (ref 250–450)
UIBC: 73 ug/dL — ABNORMAL LOW (ref 111–343)

## 2024-04-03 LAB — HEMOGLOBIN A1C
Est. average glucose Bld gHb Est-mCnc: 114 mg/dL
Hgb A1c MFr Bld: 5.6 % (ref 4.8–5.6)

## 2024-04-03 LAB — LIPID PANEL
Chol/HDL Ratio: 3.6 ratio (ref 0.0–5.0)
Cholesterol, Total: 171 mg/dL (ref 100–199)
HDL: 47 mg/dL (ref >39)
LDL Chol Calc (NIH): 103 mg/dL — ABNORMAL HIGH (ref 0–99)
Triglycerides: 116 mg/dL (ref 0–149)
VLDL Cholesterol Cal: 21 mg/dL (ref 5–40)

## 2024-04-05 NOTE — Progress Notes (Signed)
 Subjective:  Patient ID: Joseph Mercado, male    DOB: [birth year]  Age: 78 y.o. MRN: [id number]  Chief Complaint  Patient presents with   Medical Management of Chronic Issues    HPI: Hyperlipidemia.   On Pravastatin 20 mg daily, Zetia  10 mg daily without problems.  Eats healthy and exercises.  - LDL cholesterol is 103 mg/dL on recent laboratory testing - Attributes elevated cholesterol to consumption of [employer] and milkshakes - Experiences joint pain with pravastatin use   GERD - pantoprazole  40 mg daily   Hypertensive heart disease.  Patient has a history of coronary artery disease with a myocardial infarction in 2018.  Currently on lisinopril  20 mg daily and Losartan  50 mg daily, amlodipine  5 mg daily, Imdur  60 mg daily, and aspirin  81 mg daily. He should not be on both lisinopril  and losartan . He came back in the day after his appointment to confirm he was actually taking both.    Prediabetes - A1C 5.6 not currently on any medication. Patient wants to try to control it with diet and exercise.   CAD with angina: Taking Isosorbide  60 mg daily, Aspirin  81 mg daily. no CHF EF 55-60%   History of stroke in 04/2022. Sequelae is imbalance.   Idiopathic Peripheral Neuropathy: Gabapentin  300 mg by mouth TWICE A DAY   Physical activity - Walks one to two miles daily, occasionally up to eight or nine miles  GERD: on pantoprazole  40 mg daily.   Neuropathy: - Takes gabapentin .  Gout - Takes allopurinol.        06/17/2023   10:06 AM 06/11/2022    9:23 AM 04/03/2022    8:01 AM 03/27/2021    8:00 AM 03/27/2020    9:23 AM  Depression screen PHQ 2/9  Decreased Interest 0 0 0 0 0  Down, Depressed, Hopeless 0 0 0 0 0  PHQ - 2 Score 0 0 0 0 0  Altered sleeping 0      Tired, decreased energy 0      Change in appetite 0      Feeling bad or failure about yourself  0      Trouble concentrating 0      Moving slowly or fidgety/restless 0      Suicidal thoughts 0      PHQ-9 Score 0       Difficult doing work/chores Not difficult at all            06/17/2023   10:07 AM  Fall Risk   Falls in the past year? 0  Number falls in past yr: 0  Injury with Fall? 0  Risk for fall due to : No Fall Risks  Follow up Falls evaluation completed    Patient Care Team: Sherre Clapper, MD as PCP - General (Family Medicine) Bernie Lamar PARAS, MD as Consulting Physician (Cardiology) Lanis Fonda BRAVO, MD as Consulting Physician (Vascular Surgery) Kendell Marcey ORN, MD as Referring Physician (Family Medicine)   Review of Systems  Constitutional:  Negative for chills, diaphoresis, fatigue and fever.  HENT:  Negative for congestion, ear pain and sore throat.   Respiratory:  Negative for cough and shortness of breath.   Cardiovascular:  Negative for chest pain and leg swelling.  Gastrointestinal:  Negative for abdominal pain, constipation, diarrhea, nausea and vomiting.  Genitourinary:  Negative for dysuria and urgency.  Musculoskeletal:  Negative for arthralgias and myalgias.  Neurological:  Negative for dizziness and headaches.  Psychiatric/Behavioral:  Negative for dysphoric  mood.     Current Outpatient Medications on File Prior to Visit  Medication Sig Dispense Refill   allopurinol (ZYLOPRIM) 100 MG tablet TAKE 1 TABLET(100 MG) BY MOUTH TWICE DAILY AS NEEDED FOR PAIN 180 tablet 1   amLODipine  (NORVASC ) 5 MG tablet TAKE 1 TABLET(5 MG) BY MOUTH DAILY 90 tablet 1   aspirin  EC 81 MG tablet Take 1 tablet (81 mg total) by mouth daily. Resume on 05/20/17. 30 tablet 12   ezetimibe  (ZETIA ) 10 MG tablet Take 10 mg by mouth daily.     gabapentin  (NEURONTIN ) 300 MG capsule TAKE 1 CAPSULE(300 MG) BY MOUTH TWICE DAILY 60 capsule 1   ibuprofen  (ADVIL ) 600 MG tablet Take 1 tablet (600 mg total) by mouth every 8 (eight) hours as needed. 90 tablet 1   isosorbide  mononitrate (IMDUR ) 60 MG 24 hr tablet TAKE 1 TABLET(60 MG) BY MOUTH DAILY 90 tablet 3   nitroGLYCERIN  (NITROSTAT ) 0.4 MG SL tablet Place  1 tablet (0.4 mg total) under the tongue every 5 (five) minutes as needed for chest pain. 25 tablet 6   pantoprazole  (PROTONIX ) 40 MG tablet TAKE 1 TABLET(40 MG) BY MOUTH DAILY 90 tablet 1   pimecrolimus  (ELIDEL ) 1 % cream Apply topically 2 (two) times daily. 30 g 0   pravastatin (PRAVACHOL) 20 MG tablet TAKE 1 TABLET(20 MG) BY MOUTH DAILY 90 tablet 1   No current facility-administered medications on file prior to visit.   Past Medical History:  Diagnosis Date   Anemia 05/20/2017   Angina pectoris (HCC)    CAD (coronary artery disease)    a. 02/2017: NSTEMI with cath showing 100% Prox Cx stenosis with 95% Mid Cx stenosis and 45% mid-LAD stenosis. Unsuccessful attempt at crossing an occluded mid nondominant circumflex --> plan for staged PCI in coming weeks   Cardiac/pericardial tamponade 05/14/2017   Chest pain 02/2017   Chronic gouty arthritis 10/10/2019   Cognitive deficit following cerebrovascular accident (CVA) 05/30/2022   Coronary artery disease of native artery of native heart with stable angina pectoris (HCC) 10/07/2022   Dyspnea    Dyspnea on exertion 02/05/2017   Dyspnea on exertion     Elevated glucose level 07/28/2023   Encephalopathy acute 05/30/2022   GERD (gastroesophageal reflux disease)    High cholesterol    History of gout    Hypertension    Hypertensive heart disease 03/11/2017   Idiopathic peripheral neuropathy 07/28/2023   Impaired balance as late effect of cerebrovascular accident (CVA) 10/07/2022   Impotence 10/10/2019   Late effect of cerebrovascular accident (CVA) 10/04/2022   Lumbar pain 04/08/2023   Macular degeneration    Medicare annual wellness visit, subsequent 06/17/2023   Mixed hyperlipidemia 03/11/2017   Morbid obesity (HCC) 03/27/2021   Myalgia, multiple sites 06/25/2023   Neuropathy 08/04/2023   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   NSTEMI (non-ST elevated myocardial infarction) (HCC) 02/2017   Numbness  and tingling of both lower extremities 06/25/2023   Obstructive chronic bronchitis without exacerbation (HCC) 10/10/2019   Pain in both lower legs 07/28/2023   Pericardial effusion 04/2017   Pericardial tamponade    Prediabetes 08/04/2023   Right-sided extracranial carotid artery stenosis 05/30/2022   Screening for lung cancer 04/08/2023   Vision changes 06/25/2023   Past Surgical History:  Procedure Laterality Date   CATARACT EXTRACTION W/ INTRAOCULAR LENS  IMPLANT, BILATERAL Bilateral    CORONARY ANGIOPLASTY     CORONARY BALLOON ANGIOPLASTY N/A 03/10/2017   Procedure: Coronary Balloon Angioplasty;  Surgeon: Court Dorn PARAS, MD;  Location[facility] INVASIVE CV LAB;  Service: Cardiovascular;  Laterality: N/A;  CFX   CORONARY CTO INTERVENTION N/A 05/14/2017   Procedure: CORONARY CTO INTERVENTION;  Surgeon: Swaziland, Peter M, MD;  Location: [facility] INVASIVE CV LAB;  Service: Cardiovascular;  Laterality: N/A;   LEFT HEART CATH AND CORONARY ANGIOGRAPHY N/A 03/10/2017   Procedure: Left Heart Cath and Coronary Angiography;  Surgeon: Court Dorn PARAS, MD;  Location: [facility] INVASIVE CV LAB;  Service: Cardiovascular;  Laterality: N/A;   PERICARDIOCENTESIS N/A 05/14/2017   Procedure: PERICARDIOCENTESIS;  Surgeon: Swaziland, Peter M, MD;  Location: [facility] INVASIVE CV LAB;  Service: Cardiovascular;  Laterality: N/A;   SUBXYPHOID PERICARDIAL WINDOW N/A 05/15/2017   Procedure: SUBXYPHOID PERICARDIAL WINDOW WITH DRAINAGE OF PERICARDIAL EFFUSION;  Surgeon: Lucas Dorise POUR, MD;  Location: [facility] OR;  Service: Thoracic;  Laterality: N/A;   ULTRASOUND GUIDANCE FOR VASCULAR ACCESS  05/14/2017   Procedure: Ultrasound Guidance For Vascular Access;  Surgeon: Swaziland, Peter M, MD;  Location: [facility] INVASIVE CV LAB;  Service: Cardiovascular;;    Family History  Problem Relation Age of Onset   CAD Brother        CABG in his 85's   Social History   Socioeconomic History   Marital status: Divorced    Spouse name: Not on file   Number of  children: Not on file   Years of education: Not on file   Highest education level: Not on file  Occupational History   Occupation: Retired Proofreader  Tobacco Use   Smoking status: Former    Current packs/day: 0.00    Average packs/day: 3.0 packs/day for 20.0 years (60.0 ttl pk-yrs)    Types: Cigarettes    Quit date: 1985    Years since quitting: 40.6   Smokeless tobacco: Former    Types: Chew   Tobacco comments:    quit smoking in the 1980s; chewed 4-5 years after I quit smoking  Vaping Use   Vaping status: Never Used  Substance and Sexual Activity   Alcohol use: Yes    Alcohol/week: 1.0 standard drink of alcohol    Types: 1 Cans of beer per week    Comment: 05/14/2017 6-10 beers/day  06/11/22 - 2016 cut down to three beers a day, has not had any alcohol in the past 6 weeks   Drug use: No   Sexual activity: Not on file  Other Topics Concern   Not on file  Social History Narrative   Not on file   Social Drivers of Health   Financial Resource Strain: Low Risk  (06/17/2023)   Overall Financial Resource Strain (CARDIA)    Difficulty of Paying Living Expenses: Not hard at all  Food Insecurity: No Food Insecurity (04/06/2024)   Hunger Vital Sign    Worried About Running Out of Food in the Last Year: Never true    Ran Out of Food in the Last Year: Never true  Transportation Needs: No Transportation Needs (04/06/2024)   PRAPARE - Administrator, Civil Service (Medical): No    Lack of Transportation (Non-Medical): No  Physical Activity: Sufficiently Active (06/17/2023)   Exercise Vital Sign    Days of Exercise per Week: 7 days    Minutes of Exercise per Session: 60 min  Recent Concern: Physical Activity - Inactive (04/07/2023)   Exercise Vital Sign    Days of Exercise per Week: 0 days    Minutes of Exercise per Session: 0 min  Stress: No Stress  Concern Present (06/17/2023)   [employer] of Occupational Health - Occupational Stress Questionnaire    Feeling of  Stress : Not at all  Social Connections: Moderately Integrated (04/07/2023)   Social Connection and Isolation Panel    Frequency of Communication with Friends and Family: More than three times a week    Frequency of Social Gatherings with Friends and Family: More than three times a week    Attends Religious Services: More than 4 times per year    Active Member of [employer] or Organizations: No    Attends Banker Meetings: Never    Marital Status: Married    Objective:  BP 116/62   Pulse 80   Temp 98 F (36.7 C)   Ht 5' 6 (1.676 m)   Wt 233 lb (105.7 kg)   SpO2 98%   BMI 37.61 kg/m      04/06/2024    7:50 AM 03/03/2024    2:38 PM 11/10/2023   11:00 AM  BP/Weight  Systolic BP 116 112 142  Diastolic BP 62 60 84  Wt. (Lbs) 233 237 236.4  BMI 37.61 kg/m2 38.25 kg/m2 38.16 kg/m2    Physical Exam Vitals reviewed.  Constitutional:      Appearance: Normal appearance. He is obese.  Neck:     Vascular: No carotid bruit.  Cardiovascular:     Rate and Rhythm: Normal rate and regular rhythm.     Heart sounds: Normal heart sounds.  Pulmonary:     Effort: Pulmonary effort is normal.     Breath sounds: Normal breath sounds. No wheezing, rhonchi or rales.  Abdominal:     General: Bowel sounds are normal.     Palpations: Abdomen is soft.     Tenderness: There is no abdominal tenderness.  Neurological:     Mental Status: He is alert.  Psychiatric:        Mood and Affect: Mood normal.        Behavior: Behavior normal.      Diabetic foot exam was performed with the following findings:   No deformities, ulcerations, or other skin breakdown Normal sensation of 10g monofilament Intact posterior tibialis and dorsalis pedis pulses      Lab Results  Component Value Date   WBC 8.7 04/02/2024   HGB 15.3 04/02/2024   HCT 47.3 04/02/2024   PLT 215 04/02/2024   GLUCOSE 92 04/02/2024   CHOL 171 04/02/2024   TRIG 116 04/02/2024   HDL 47 04/02/2024   LDLCALC 103 (H)  04/02/2024   ALT 22 04/02/2024   AST 26 04/02/2024   NA 134 04/02/2024   K 4.7 04/02/2024   CL 97 04/02/2024   CREATININE 1.04 04/02/2024   BUN 9 04/02/2024   CO2 22 04/02/2024   TSH 1.480 06/25/2023   INR 1.11 05/20/2017   HGBA1C 5.6 04/02/2024      Assessment & Plan:  Hypertensive heart disease without heart failure Assessment & Plan: Blood pressure well-controlled. Currently on both lisinopril  and losartan , which is unnecessary. - Discontinued lisinopril . Increase losartan  50 mg twice daily. after confirming which one he is taking. Continue amlodipine  5 mg daily, Imdur  60 mg daily, and aspirin  81 mg daily. Labs reviewed.   Mixed hyperlipidemia Assessment & Plan: LDL at 103, above target. Statin-associated myalgia with pravastatin. Reluctant to use injectable cholesterol medication.  - Discontinue pravastatin. Continue zetia  10 mg daily.  - Encourage dietary changes to reduce cholesterol intake, including cutting back on milk, ice cream, and milkshakes.  Patient to consider repatha.   Prediabetes Assessment & Plan: A1c indicates well-controlled prediabetes. - Encourage dietary changes, including reducing sugar intake by drinking unsweetened or half-and-half tea. - Continue regular exercise, such as walking one to two miles daily.     Anemia, unspecified type Assessment & Plan: Resolved.  May discontinue iron  supplement.   Idiopathic peripheral neuropathy Assessment & Plan: Takes gabapentin  300 mg twice daily regularly. - Continue gabapentin  300 mg twice daily.   Gastroesophageal reflux disease without esophagitis Assessment & Plan: The current medical regimen is effective;  continue present plan and medications.  Pantoprazole  40 mg daily 40 mg daily    Class 2 severe obesity due to excess calories with serious comorbidity and body mass index (BMI) of 37.0 to 37.9 in adult [facility]) Assessment & Plan: Recommend continue to work on eating healthy diet and  exercise. Comorbidities: hyperlipidemia.    No orders of the defined types were placed in this encounter.   No orders of the defined types were placed in this encounter.    Follow-up: Return in about 3 months (around 07/07/2024) for chronic follow up.   I,Katherina A Bramblett,acting as a scribe for Abigail Free, MD.,have documented all relevant documentation on the behalf of Abigail Free, MD,as directed by  Abigail Free, MD while in the presence of Abigail Free, MD.   An After Visit Summary was printed and given to the patient.  I attest that I have reviewed this visit and agree with the plan scribed by my staff.   Abigail Free, MD [facility] [phone]

## 2024-04-06 ENCOUNTER — Ambulatory Visit (INDEPENDENT_AMBULATORY_CARE_PROVIDER_SITE_OTHER): Payer: PPO | Admitting: Family Medicine

## 2024-04-06 ENCOUNTER — Encounter: Payer: Self-pay | Admitting: Family Medicine

## 2024-04-06 VITALS — BP 116/62 | HR 80 | Temp 98.0°F | Ht 66.0 in | Wt 233.0 lb

## 2024-04-06 DIAGNOSIS — E782 Mixed hyperlipidemia: Secondary | ICD-10-CM | POA: Diagnosis not present

## 2024-04-06 DIAGNOSIS — D649 Anemia, unspecified: Secondary | ICD-10-CM

## 2024-04-06 DIAGNOSIS — K219 Gastro-esophageal reflux disease without esophagitis: Secondary | ICD-10-CM

## 2024-04-06 DIAGNOSIS — R7303 Prediabetes: Secondary | ICD-10-CM

## 2024-04-06 DIAGNOSIS — G609 Hereditary and idiopathic neuropathy, unspecified: Secondary | ICD-10-CM

## 2024-04-06 DIAGNOSIS — I119 Hypertensive heart disease without heart failure: Secondary | ICD-10-CM

## 2024-04-06 DIAGNOSIS — E66812 Obesity, class 2: Secondary | ICD-10-CM

## 2024-04-06 DIAGNOSIS — Z6837 Body mass index (BMI) 37.0-37.9, adult: Secondary | ICD-10-CM

## 2024-04-06 NOTE — Assessment & Plan Note (Addendum)
 The current medical regimen is effective;  continue present plan and medications.  Pantoprazole  40 mg daily 40 mg daily

## 2024-04-06 NOTE — Assessment & Plan Note (Signed)
 A1c indicates well-controlled prediabetes. - Encourage dietary changes, including reducing sugar intake by drinking unsweetened or half-and-half tea. - Continue regular exercise, such as walking one to two miles daily.

## 2024-04-06 NOTE — Assessment & Plan Note (Signed)
 Takes gabapentin  300 mg twice daily regularly. - Continue gabapentin  300 mg twice daily.

## 2024-04-06 NOTE — Assessment & Plan Note (Addendum)
 LDL at 103, above target. Statin-associated myalgia with pravastatin. Reluctant to use injectable cholesterol medication.  - Discontinue pravastatin. Continue zetia  10 mg daily.  - Encourage dietary changes to reduce cholesterol intake, including cutting back on milk, ice cream, and milkshakes. Patient to consider repatha.

## 2024-04-06 NOTE — Assessment & Plan Note (Addendum)
 Blood pressure well-controlled. Currently on both lisinopril  and losartan , which is unnecessary. - Discontinued lisinopril . Increase losartan  50 mg twice daily. after confirming which one he is taking. Continue amlodipine  5 mg daily, Imdur  60 mg daily, and aspirin  81 mg daily. Labs reviewed.

## 2024-04-06 NOTE — Patient Instructions (Signed)
 STOP IRON  SUPPLEMENT.   CHECK MEDICINES: PLEASE CHECK IF YOU ARE TAKING LISINOPRIL  OR LOSARTAN  OR BOTH.

## 2024-04-09 ENCOUNTER — Other Ambulatory Visit: Payer: Self-pay

## 2024-04-09 MED ORDER — LOSARTAN POTASSIUM 50 MG PO TABS: 50.0000 mg | ORAL_TABLET | Freq: Two times a day (BID) | ORAL | 0 refills | Status: DC

## 2024-04-10 ENCOUNTER — Other Ambulatory Visit: Payer: Self-pay | Admitting: Family Medicine

## 2024-04-10 DIAGNOSIS — E66812 Obesity, class 2: Secondary | ICD-10-CM | POA: Insufficient documentation

## 2024-04-10 HISTORY — DX: Morbid (severe) obesity due to excess calories: E66.01

## 2024-04-10 NOTE — Assessment & Plan Note (Signed)
 Recommend continue to work on eating healthy diet and exercise. Comorbidities: hyperlipidemia.

## 2024-04-10 NOTE — Assessment & Plan Note (Signed)
 Resolved.  May discontinue iron  supplement.

## 2024-04-24 ENCOUNTER — Other Ambulatory Visit: Payer: Self-pay | Admitting: Family Medicine

## 2024-04-24 DIAGNOSIS — D509 Iron deficiency anemia, unspecified: Secondary | ICD-10-CM

## 2024-05-11 ENCOUNTER — Ambulatory Visit (INDEPENDENT_AMBULATORY_CARE_PROVIDER_SITE_OTHER)

## 2024-05-11 DIAGNOSIS — Z23 Encounter for immunization: Secondary | ICD-10-CM

## 2024-05-14 DIAGNOSIS — I2510 Atherosclerotic heart disease of native coronary artery without angina pectoris: Secondary | ICD-10-CM | POA: Insufficient documentation

## 2024-05-14 DIAGNOSIS — K219 Gastro-esophageal reflux disease without esophagitis: Secondary | ICD-10-CM | POA: Insufficient documentation

## 2024-05-14 DIAGNOSIS — R0600 Dyspnea, unspecified: Secondary | ICD-10-CM | POA: Insufficient documentation

## 2024-05-14 DIAGNOSIS — E7800 Pure hypercholesterolemia, unspecified: Secondary | ICD-10-CM | POA: Insufficient documentation

## 2024-05-14 DIAGNOSIS — I10 Essential (primary) hypertension: Secondary | ICD-10-CM | POA: Insufficient documentation

## 2024-05-18 ENCOUNTER — Ambulatory Visit: Attending: Cardiology | Admitting: Cardiology

## 2024-05-18 ENCOUNTER — Encounter: Payer: Self-pay | Admitting: Cardiology

## 2024-05-18 ENCOUNTER — Other Ambulatory Visit: Payer: Self-pay | Admitting: Family Medicine

## 2024-05-18 VITALS — BP 138/84 | HR 76 | Ht 66.0 in | Wt 232.0 lb

## 2024-05-18 DIAGNOSIS — I25118 Atherosclerotic heart disease of native coronary artery with other forms of angina pectoris: Secondary | ICD-10-CM | POA: Diagnosis not present

## 2024-05-18 DIAGNOSIS — R0609 Other forms of dyspnea: Secondary | ICD-10-CM

## 2024-05-18 DIAGNOSIS — I119 Hypertensive heart disease without heart failure: Secondary | ICD-10-CM

## 2024-05-18 DIAGNOSIS — E7800 Pure hypercholesterolemia, unspecified: Secondary | ICD-10-CM | POA: Diagnosis not present

## 2024-05-18 DIAGNOSIS — I10 Essential (primary) hypertension: Secondary | ICD-10-CM

## 2024-05-18 NOTE — Progress Notes (Signed)
 Cardiology Office Note:    Date:  05/18/2024   ID:  Joseph Mercado, DOB Dec 14, 1945, MRN [id number]  PCP:  Sherre Clapper, MD  Cardiologist:  Lamar Fitch, MD    Referring MD: Sherre Clapper, MD   No chief complaint on file.   History of Present Illness:    Joseph Mercado is a 78 y.o. male   with past medical history significant for essential hypertension, coronary disease in June 2018 he had cardiac catheterization done he was found to have complete occlusion of circumflex artery with collateralization for occluded large obtuse marginal. PCI was attempted however we unable to cross the lesion. Then a second attempt was made in September 2018 procedure was complicated by pericardial effusion that required pericardiocentesis more than 500 cc of blood has been removed. After that patient has been stabilized however shortly thereafter drain has been removed he end up having problems again and then eventually pericardial window has been done. Since that time he seems to be doing fine additional problem include essential hypertension, dyslipidemia.  Comes today to months for follow-up overall doing great.  Still exercise on a regular basis walking 2 to 4 miles every single day does have some exertional shortness of breath but denies having any chest pain tightness squeezing pressure burning chest no palpitation dizziness swelling of lower extremities, overall looking good s  Past Medical History:  Diagnosis Date   Anemia 05/20/2017   CAD (coronary artery disease)    a. 02/2017: NSTEMI with cath showing 100% Prox Cx stenosis with 95% Mid Cx stenosis and 45% mid-LAD stenosis. Unsuccessful attempt at crossing an occluded mid nondominant circumflex --> plan for staged PCI in coming weeks   Chest pain 02/2017   Chronic gouty arthritis 10/10/2019   Class 2 severe obesity due to excess calories with serious comorbidity and body mass index (BMI) of 37.0 to 37.9 in adult 04/10/2024   Cognitive deficit following  cerebrovascular accident (CVA) 05/30/2022   Coronary artery disease of native artery of native heart with stable angina pectoris 10/07/2022   Dyspnea    Dyspnea on exertion 02/05/2017   Dyspnea on exertion     Elevated glucose level 07/28/2023   Gastroesophageal reflux disease without esophagitis 08/27/2021   GERD (gastroesophageal reflux disease)    High cholesterol    Hypertension    Hypertensive heart disease 03/11/2017   Idiopathic peripheral neuropathy 07/28/2023   Impaired balance as late effect of cerebrovascular accident (CVA) 10/07/2022   Impotence 10/10/2019   Late effect of cerebrovascular accident (CVA) 10/04/2022   Lumbar pain 04/08/2023   Macular degeneration    Medicare annual wellness visit, subsequent 06/17/2023   Mixed hyperlipidemia 03/11/2017   Morbid obesity (HCC) 03/27/2021   Myalgia, multiple sites 06/25/2023   Neuropathy 08/04/2023   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   Numbness and tingling of both lower extremities 06/25/2023   Obstructive chronic bronchitis without exacerbation (HCC) 10/10/2019   Pain in both lower legs 07/28/2023   Prediabetes 08/04/2023   Right-sided extracranial carotid artery stenosis 05/30/2022   Screening for lung cancer 04/08/2023   Senile purpura 03/27/2021   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   Vision changes 06/25/2023    Past Surgical History:  Procedure Laterality Date   CATARACT EXTRACTION W/ INTRAOCULAR LENS  IMPLANT, BILATERAL Bilateral    CORONARY ANGIOPLASTY     CORONARY BALLOON ANGIOPLASTY N/A 03/10/2017   Procedure: Coronary Balloon Angioplasty;  Surgeon: Court Dorn PARAS, MD;  Location: [facility] INVASIVE CV LAB;  Service:  Cardiovascular;  Laterality: N/A;  CFX   CORONARY CTO INTERVENTION N/A 05/14/2017   Procedure: CORONARY CTO INTERVENTION;  Surgeon: Swaziland, Peter M, MD;  Location: [facility] INVASIVE CV LAB;  Service: Cardiovascular;  Laterality: N/A;   LEFT HEART CATH AND CORONARY ANGIOGRAPHY  N/A 03/10/2017   Procedure: Left Heart Cath and Coronary Angiography;  Surgeon: Court Dorn PARAS, MD;  Location: [facility] INVASIVE CV LAB;  Service: Cardiovascular;  Laterality: N/A;   PERICARDIOCENTESIS N/A 05/14/2017   Procedure: PERICARDIOCENTESIS;  Surgeon: Swaziland, Peter M, MD;  Location: [facility] INVASIVE CV LAB;  Service: Cardiovascular;  Laterality: N/A;   SUBXYPHOID PERICARDIAL WINDOW N/A 05/15/2017   Procedure: SUBXYPHOID PERICARDIAL WINDOW WITH DRAINAGE OF PERICARDIAL EFFUSION;  Surgeon: Lucas Dorise POUR, MD;  Location: [facility] OR;  Service: Thoracic;  Laterality: N/A;   ULTRASOUND GUIDANCE FOR VASCULAR ACCESS  05/14/2017   Procedure: Ultrasound Guidance For Vascular Access;  Surgeon: Swaziland, Peter M, MD;  Location[facility] INVASIVE CV LAB;  Service: Cardiovascular;;    Current Medications: Current Meds  Medication Sig   allopurinol (ZYLOPRIM) 100 MG tablet TAKE 1 TABLET(100 MG) BY MOUTH TWICE DAILY AS NEEDED FOR PAIN   aspirin  EC 81 MG tablet Take 1 tablet (81 mg total) by mouth daily. Resume on 05/20/17.   ezetimibe  (ZETIA ) 10 MG tablet Take 10 mg by mouth daily.   gabapentin  (NEURONTIN ) 300 MG capsule TAKE 1 CAPSULE(300 MG) BY MOUTH TWICE DAILY   ibuprofen  (ADVIL ) 600 MG tablet Take 1 tablet (600 mg total) by mouth every 8 (eight) hours as needed.   isosorbide  mononitrate (IMDUR ) 60 MG 24 hr tablet TAKE 1 TABLET(60 MG) BY MOUTH DAILY   losartan  (COZAAR ) 50 MG tablet TAKE 1 TABLET(50 MG) BY MOUTH TWICE DAILY   nitroGLYCERIN  (NITROSTAT ) 0.4 MG SL tablet Place 1 tablet (0.4 mg total) under the tongue every 5 (five) minutes as needed for chest pain.   pantoprazole  (PROTONIX ) 40 MG tablet TAKE 1 TABLET(40 MG) BY MOUTH DAILY   pimecrolimus  (ELIDEL ) 1 % cream Apply topically 2 (two) times daily.   pravastatin (PRAVACHOL) 20 MG tablet TAKE 1 TABLET(20 MG) BY MOUTH DAILY   [DISCONTINUED] amLODipine  (NORVASC ) 5 MG tablet TAKE 1 TABLET(5 MG) BY MOUTH DAILY     Allergies:   Nexlizet  [bempedoic acid-ezetimibe ] and  Prednisone    Social History   Socioeconomic History   Marital status: Divorced    Spouse name: Not on file   Number of children: Not on file   Years of education: Not on file   Highest education level: Not on file  Occupational History   Occupation: Retired Proofreader  Tobacco Use   Smoking status: Former    Current packs/day: 0.00    Average packs/day: 3.0 packs/day for 20.0 years (60.0 ttl pk-yrs)    Types: Cigarettes    Quit date: 1985    Years since quitting: 40.7   Smokeless tobacco: Former    Types: Chew   Tobacco comments:    quit smoking in the 1980s; chewed 4-5 years after I quit smoking  Vaping Use   Vaping status: Never Used  Substance and Sexual Activity   Alcohol use: Yes    Alcohol/week: 1.0 standard drink of alcohol    Types: 1 Cans of beer per week    Comment: 05/14/2017 6-10 beers/day  06/11/22 - 2016 cut down to three beers a day, has not had any alcohol in the past 6 weeks   Drug use: No   Sexual activity: Not on file  Other Topics  Concern   Not on file  Social History Narrative   Not on file   Social Drivers of Health   Financial Resource Strain: Low Risk  (06/17/2023)   Overall Financial Resource Strain (CARDIA)    Difficulty of Paying Living Expenses: Not hard at all  Food Insecurity: No Food Insecurity (04/06/2024)   Hunger Vital Sign    Worried About Running Out of Food in the Last Year: Never true    Ran Out of Food in the Last Year: Never true  Transportation Needs: No Transportation Needs (04/06/2024)   PRAPARE - Administrator, Civil Service (Medical): No    Lack of Transportation (Non-Medical): No  Physical Activity: Sufficiently Active (06/17/2023)   Exercise Vital Sign    Days of Exercise per Week: 7 days    Minutes of Exercise per Session: 60 min  Recent Concern: Physical Activity - Inactive (04/07/2023)   Exercise Vital Sign    Days of Exercise per Week: 0 days    Minutes of Exercise per Session: 0 min  Stress: No  Stress Concern Present (06/17/2023)   [employer] of Occupational Health - Occupational Stress Questionnaire    Feeling of Stress : Not at all  Social Connections: Moderately Integrated (04/07/2023)   Social Connection and Isolation Panel    Frequency of Communication with Friends and Family: More than three times a week    Frequency of Social Gatherings with Friends and Family: More than three times a week    Attends Religious Services: More than 4 times per year    Active Member of [employer] or Organizations: No    Attends Banker Meetings: Never    Marital Status: Married     Family History: The patient's family history includes CAD in his brother. ROS:   Please see the history of present illness.    All 14 point review of systems negative except as described per history of present illness  EKGs/Labs/Other Studies Reviewed:         Recent Labs: 06/25/2023: Magnesium  2.3; TSH 1.480 04/02/2024: ALT 22; BUN 9; Creatinine, Ser 1.04; Hemoglobin 15.3; Platelets 215; Potassium 4.7; Sodium 134  Recent Lipid Panel    Component Value Date/Time   CHOL 171 04/02/2024 0821   TRIG 116 04/02/2024 0821   HDL 47 04/02/2024 0821   CHOLHDL 3.6 04/02/2024 0821   CHOLHDL 3.3 03/11/2017 0214   VLDL 14 03/11/2017 0214   LDLCALC 103 (H) 04/02/2024 0821    Physical Exam:    VS:  BP 138/84   Pulse 76   Ht 5' 6 (1.676 m)   Wt 232 lb (105.2 kg)   SpO2 96%   BMI 37.45 kg/m     Wt Readings from Last 3 Encounters:  05/18/24 232 lb (105.2 kg)  04/06/24 233 lb (105.7 kg)  03/03/24 237 lb (107.5 kg)     GEN:  Well nourished, well developed in no acute distress HEENT: Normal NECK: No JVD; No carotid bruits LYMPHATICS: No lymphadenopathy CARDIAC: RRR, no murmurs, no rubs, no gallops RESPIRATORY:  Clear to auscultation without rales, wheezing or rhonchi  ABDOMEN: Soft, non-tender, non-distended MUSCULOSKELETAL:  No edema; No deformity  SKIN: Warm and dry LOWER EXTREMITIES:  no swelling NEUROLOGIC:  Alert and oriented x 3 PSYCHIATRIC:  Normal affect   ASSESSMENT:    1. Coronary artery disease of native artery of native heart with stable angina pectoris   2. Hypertensive heart disease without heart failure   3. Dyspnea on  exertion   4. High cholesterol    PLAN:    In order of problems listed above:  Coronary disease stable on high-dose directed medical therapy, asymptomatic, continue with antiplatelet therapy and antianginal therapy and statin. Dyslipidemia I did review K[facility] LDL of 103 HDL 47, again we had a discussion about potentially taking more medication for his cholesterol he does not want to do anything about it he admits that he drinks a lot of dairy products he is trying to cut it down and recheck cholesterol.  Will continue this conversation. Dyspnea on exertion stable doing well. Hypertension blood pressure well-controlled   Medication Adjustments/Labs and Tests Ordered: Current medicines are reviewed at length with the patient today.  Concerns regarding medicines are outlined above.  No orders of the defined types were placed in this encounter.  Medication changes: No orders of the defined types were placed in this encounter.   Signed, Lamar DOROTHA Fitch, MD, [facility] 05/18/2024 8:17 AM    [facility]

## 2024-05-18 NOTE — Patient Instructions (Signed)

## 2024-05-24 ENCOUNTER — Other Ambulatory Visit: Payer: Self-pay | Admitting: Family Medicine

## 2024-05-24 DIAGNOSIS — G609 Hereditary and idiopathic neuropathy, unspecified: Secondary | ICD-10-CM

## 2024-06-02 NOTE — Progress Notes (Signed)
   06/02/2024  Patient ID: Joseph Mercado, male   DOB: 1946-04-11, 78 y.o.   MRN: [id number]  Pharmacy Quality Measure Review  This patient is appearing on a report for being at risk of failing the adherence measure for hypertension (ACEi/ARB) medications this calendar year.   Medication: losartan  Last fill date: 04/12/24 for 90 day supply  Insurance report was not up to date. No action needed at this time.   Lang Sieve, PharmD, BCGP Clinical Pharmacist  [phone]

## 2024-06-17 ENCOUNTER — Ambulatory Visit (INDEPENDENT_AMBULATORY_CARE_PROVIDER_SITE_OTHER): Admitting: Family Medicine

## 2024-06-17 ENCOUNTER — Encounter: Payer: Self-pay | Admitting: Family Medicine

## 2024-06-17 VITALS — BP 120/64 | HR 99 | Temp 97.2°F | Resp 16 | Ht 66.0 in | Wt 233.0 lb

## 2024-06-17 DIAGNOSIS — G609 Hereditary and idiopathic neuropathy, unspecified: Secondary | ICD-10-CM | POA: Diagnosis not present

## 2024-06-17 DIAGNOSIS — Z0000 Encounter for general adult medical examination without abnormal findings: Secondary | ICD-10-CM | POA: Diagnosis not present

## 2024-06-17 MED ORDER — GABAPENTIN 100 MG PO CAPS: 100.0000 mg | ORAL_CAPSULE | Freq: Three times a day (TID) | ORAL | 3 refills | Status: AC

## 2024-06-17 NOTE — Assessment & Plan Note (Signed)
 Up to date on screening and vaccines  Things to do to keep yourself healthy  - Exercise at least 30-45 minutes a day, 3-4 days a week.  - Eat a low-fat diet with lots of fruits and vegetables, up to 7-9 servings per day.  - Seatbelts can save your life. Wear them always.  - Smoke detectors on every level of your home, check batteries every year.  - Eye Doctor - have an eye exam every 1-2 years  - Safe sex - if you may be exposed to STDs, use a condom.  - Alcohol -  If you drink, do it moderately, less than 2 drinks per day.  - Health Care Power of Attorney. Choose someone to speak for you if you are not able.  - Depression is common in our stressful world.If you're feeling down or losing interest in things you normally enjoy, please come in for a visit.  - Violence - If anyone is threatening or hurting you, please call immediately.

## 2024-06-17 NOTE — Assessment & Plan Note (Signed)
 Asked for lower dose to wean to lower dose on some days

## 2024-06-17 NOTE — Progress Notes (Signed)
 Subjective:   Joseph Mercado is a 78 y.o. male who presents for Medicare Annual/Subsequent preventive examination.  Visit Complete: In person  Patient Medicare AWV questionnaire was completed by the patient on 06/17/24; I have confirmed that all information answered by patient is correct and no changes since this date.  Cardiac Risk Factors include: advanced age (>67men, >47 women);male gender     Objective:    Today's Vitals   06/17/24 1338 06/17/24 1349  BP: 120/64   Pulse: 99   Resp: 16   Temp: (!) 97.2 F (36.2 C)   SpO2: 97%   Weight: 233 lb (105.7 kg)   Height: 5' 6 (1.676 m)   PainSc: 0-No pain 0-No pain   Body mass index is 37.61 kg/m.     06/17/2024    1:52 PM 06/11/2022    9:26 AM 05/20/2017   12:25 AM 05/14/2017    7:06 AM 03/10/2017    9:36 AM  Advanced Directives  Does Patient Have a Medical Advance Directive? No No No  No  No   Would patient like information on creating a medical advance directive? Yes (ED - Information included in AVS) No - Patient declined No - Patient declined  No - Patient declined  No - Patient declined      Data saved with a previous flowsheet row definition    Current Medications (verified) Outpatient Encounter Medications as of 06/17/2024  Medication Sig   allopurinol (ZYLOPRIM) 100 MG tablet TAKE 1 TABLET(100 MG) BY MOUTH TWICE DAILY AS NEEDED FOR PAIN   amLODipine  (NORVASC ) 5 MG tablet TAKE 1 TABLET(5 MG) BY MOUTH DAILY   aspirin  EC 81 MG tablet Take 1 tablet (81 mg total) by mouth daily. Resume on 05/20/17.   ezetimibe  (ZETIA ) 10 MG tablet Take 10 mg by mouth daily.   gabapentin  (NEURONTIN ) 300 MG capsule TAKE 1 CAPSULE(300 MG) BY MOUTH TWICE DAILY   ibuprofen  (ADVIL ) 600 MG tablet Take 1 tablet (600 mg total) by mouth every 8 (eight) hours as needed.   isosorbide  mononitrate (IMDUR ) 60 MG 24 hr tablet TAKE 1 TABLET(60 MG) BY MOUTH DAILY   losartan  (COZAAR ) 50 MG tablet TAKE 1 TABLET(50 MG) BY MOUTH TWICE DAILY    nitroGLYCERIN  (NITROSTAT ) 0.4 MG SL tablet Place 1 tablet (0.4 mg total) under the tongue every 5 (five) minutes as needed for chest pain.   pantoprazole  (PROTONIX ) 40 MG tablet TAKE 1 TABLET(40 MG) BY MOUTH DAILY   pimecrolimus  (ELIDEL ) 1 % cream Apply topically 2 (two) times daily.   pravastatin (PRAVACHOL) 20 MG tablet TAKE 1 TABLET(20 MG) BY MOUTH DAILY   No facility-administered encounter medications on file as of 06/17/2024.    Allergies (verified) Nexlizet  [bempedoic acid-ezetimibe ] and Prednisone    History: Past Medical History:  Diagnosis Date   Anemia 05/20/2017   CAD (coronary artery disease)    a. 02/2017: NSTEMI with cath showing 100% Prox Cx stenosis with 95% Mid Cx stenosis and 45% mid-LAD stenosis. Unsuccessful attempt at crossing an occluded mid nondominant circumflex --> plan for staged PCI in coming weeks   Chest pain 02/2017   Chronic gouty arthritis 10/10/2019   Class 2 severe obesity due to excess calories with serious comorbidity and body mass index (BMI) of 37.0 to 37.9 in adult 04/10/2024   Cognitive deficit following cerebrovascular accident (CVA) 05/30/2022   Coronary artery disease of native artery of native heart with stable angina pectoris 10/07/2022   Dyspnea    Dyspnea on exertion 02/05/2017  Dyspnea on exertion     Elevated glucose level 07/28/2023   Gastroesophageal reflux disease without esophagitis 08/27/2021   GERD (gastroesophageal reflux disease)    High cholesterol    Hypertension    Hypertensive heart disease 03/11/2017   Idiopathic peripheral neuropathy 07/28/2023   Impaired balance as late effect of cerebrovascular accident (CVA) 10/07/2022   Impotence 10/10/2019   Late effect of cerebrovascular accident (CVA) 10/04/2022   Lumbar pain 04/08/2023   Macular degeneration    Medicare annual wellness visit, subsequent 06/17/2023   Mixed hyperlipidemia 03/11/2017   Morbid obesity (HCC) 03/27/2021   Myalgia, multiple sites 06/25/2023    Neuropathy 08/04/2023   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   Numbness and tingling of both lower extremities 06/25/2023   Obstructive chronic bronchitis without exacerbation (HCC) 10/10/2019   Pain in both lower legs 07/28/2023   Prediabetes 08/04/2023   Right-sided extracranial carotid artery stenosis 05/30/2022   Screening for lung cancer 04/08/2023   Senile purpura 03/27/2021   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   Vision changes 06/25/2023   Past Surgical History:  Procedure Laterality Date   CATARACT EXTRACTION W/ INTRAOCULAR LENS  IMPLANT, BILATERAL Bilateral    CORONARY ANGIOPLASTY     CORONARY BALLOON ANGIOPLASTY N/A 03/10/2017   Procedure: Coronary Balloon Angioplasty;  Surgeon: Court Dorn PARAS, MD;  Location: [facility] INVASIVE CV LAB;  Service: Cardiovascular;  Laterality: N/A;  CFX   CORONARY CTO INTERVENTION N/A 05/14/2017   Procedure: CORONARY CTO INTERVENTION;  Surgeon: Jordan, Peter M, MD;  Location: [facility] INVASIVE CV LAB;  Service: Cardiovascular;  Laterality: N/A;   LEFT HEART CATH AND CORONARY ANGIOGRAPHY N/A 03/10/2017   Procedure: Left Heart Cath and Coronary Angiography;  Surgeon: Court Dorn PARAS, MD;  Location: [facility] INVASIVE CV LAB;  Service: Cardiovascular;  Laterality: N/A;   PERICARDIOCENTESIS N/A 05/14/2017   Procedure: PERICARDIOCENTESIS;  Surgeon: Jordan, Peter M, MD;  Location: [facility] INVASIVE CV LAB;  Service: Cardiovascular;  Laterality: N/A;   SUBXYPHOID PERICARDIAL WINDOW N/A 05/15/2017   Procedure: SUBXYPHOID PERICARDIAL WINDOW WITH DRAINAGE OF PERICARDIAL EFFUSION;  Surgeon: Lucas Dorise POUR, MD;  Location: [facility] OR;  Service: Thoracic;  Laterality: N/A;   ULTRASOUND GUIDANCE FOR VASCULAR ACCESS  05/14/2017   Procedure: Ultrasound Guidance For Vascular Access;  Surgeon: Jordan, Peter M, MD;  Location: [facility] INVASIVE CV LAB;  Service: Cardiovascular;;   Family History  Problem Relation Age of Onset   CAD Brother        CABG in his 38's   Social  History   Socioeconomic History   Marital status: Divorced    Spouse name: Not on file   Number of children: Not on file   Years of education: Not on file   Highest education level: Not on file  Occupational History   Occupation: Retired Proofreader  Tobacco Use   Smoking status: Former    Current packs/day: 0.00    Average packs/day: 3.0 packs/day for 20.0 years (60.0 ttl pk-yrs)    Types: Cigarettes    Quit date: 1985    Years since quitting: 40.8   Smokeless tobacco: Former    Types: Chew   Tobacco comments:    quit smoking in the 1980s; chewed 4-5 years after I quit smoking  Vaping Use   Vaping status: Never Used  Substance and Sexual Activity   Alcohol use: Yes    Alcohol/week: 1.0 standard drink of alcohol    Types: 1 Cans of beer per week    Comment:  05/14/2017 6-10 beers/day  06/11/22 - 2016 cut down to three beers a day, has not had any alcohol in the past 6 weeks   Drug use: No   Sexual activity: Not on file  Other Topics Concern   Not on file  Social History Narrative   Not on file   Social Drivers of Health   Financial Resource Strain: Low Risk  (06/17/2024)   Overall Financial Resource Strain (CARDIA)    Difficulty of Paying Living Expenses: Not hard at all  Food Insecurity: No Food Insecurity (06/17/2024)   Hunger Vital Sign    Worried About Running Out of Food in the Last Year: Never true    Ran Out of Food in the Last Year: Never true  Transportation Needs: No Transportation Needs (04/06/2024)   PRAPARE - Administrator, Civil Service (Medical): No    Lack of Transportation (Non-Medical): No  Physical Activity: Sufficiently Active (06/17/2024)   Exercise Vital Sign    Days of Exercise per Week: 7 days    Minutes of Exercise per Session: 60 min  Stress: No Stress Concern Present (06/17/2024)   [employer] of Occupational Health - Occupational Stress Questionnaire    Feeling of Stress: Not at all  Social Connections: Moderately  Integrated (04/07/2023)   Social Connection and Isolation Panel    Frequency of Communication with Friends and Family: More than three times a week    Frequency of Social Gatherings with Friends and Family: More than three times a week    Attends Religious Services: More than 4 times per year    Active Member of [employer] or Organizations: No    Attends Engineer, Structural: Never    Marital Status: Married    Tobacco Counseling Counseling given: Not Answered Tobacco comments: quit smoking in the 1980s; chewed 4-5 years after I quit smoking   Clinical Intake:  Pre-visit preparation completed: Yes  Pain : No/denies pain Pain Score: 0-No pain     BMI - recorded: 37.61 Nutritional Status: BMI > 30  Obese Nutritional Risks: None Diabetes: No  How often do you need to have someone help you when you read instructions, pamphlets, or other written materials from your doctor or pharmacy?: 1 - Never What is the last grade level you completed in school?: 11 th grade  Interpreter Needed?: No      Activities of Daily Living    06/17/2024    1:54 PM  In your present state of health, do you have any difficulty performing the following activities:  Hearing? 0  Vision? 1  Comment driving eyeglasses  Difficulty concentrating or making decisions? 0  Walking or climbing stairs? 0  Dressing or bathing? 0  Doing errands, shopping? 0  Preparing Food and eating ? N  Using the Toilet? N  In the past six months, have you accidently leaked urine? N  Do you have problems with loss of bowel control? N  Managing your Medications? N  Managing your Finances? N  Housekeeping or managing your Housekeeping? N    Patient Care Team: Sherre Clapper, MD as PCP - General (Family Medicine) Bernie Lamar PARAS, MD as Consulting Physician (Cardiology) Lanis Fonda BRAVO, MD as Consulting Physician (Vascular Surgery) Kendell Marcey ORN, MD as Referring Physician (Family Medicine)  Indicate any  recent Medical Services you may have received from other than [facility] providers in the past year (date may be approximate).     Assessment:   This is a routine wellness  examination for Joseph Mercado.  Hearing/Vision screen No results found.   Goals Addressed               This Visit's Progress     Patient Stated (pt-stated)   On track     To stay independent as much as he can and not pee in my breeches.        Depression Screen    06/17/2024    1:53 PM 06/17/2023   10:06 AM 06/11/2022    9:23 AM 04/03/2022    8:01 AM 03/27/2021    8:00 AM 03/27/2020    9:23 AM  PHQ 2/9 Scores  PHQ - 2 Score 0 0 0 0 0 0  PHQ- 9 Score  0        Fall Risk    06/17/2024    1:52 PM 06/17/2023   10:07 AM 06/11/2022    9:26 AM 04/03/2022    8:01 AM 10/02/2021    7:46 AM  Fall Risk   Falls in the past year? 0 0 0 0 0  Number falls in past yr: 0 0 0 0 0  Injury with Fall? 0 0 0 0 0  Risk for fall due to : Impaired balance/gait No Fall Risks No Fall Risks No Fall Risks No Fall Risks  Follow up Education provided;Falls evaluation completed Falls evaluation completed Falls evaluation completed;Falls prevention discussed  Falls evaluation completed  Falls evaluation completed      Data saved with a previous flowsheet row definition    MEDICARE RISK AT HOME: Medicare Risk at Home Any stairs in or around the home?: No If so, are there any without handrails?: No Home free of loose throw rugs in walkways, pet beds, electrical cords, etc?: Yes Adequate lighting in your home to reduce risk of falls?: Yes Life alert?: No Use of a cane, walker or w/c?: No Grab bars in the bathroom?: Yes Shower chair or bench in shower?: Yes Elevated toilet seat or a handicapped toilet?: Yes  TIMED UP AND GO:  Was the test performed?  Yes  Length of time to ambulate 10 feet: 6 sec Gait steady and fast without use of assistive device    Cognitive Function:        06/17/2024    1:55 PM 06/17/2023   10:11 AM  06/11/2022    9:27 AM  6CIT Screen  What Year? 0 points 0 points 0 points  What month? 0 points 0 points 0 points  What time? 0 points 0 points 0 points  Count back from 20 0 points 0 points 0 points  Months in reverse 0 points 0 points 0 points  Repeat phrase 0 points 0 points 0 points  Total Score 0 points 0 points 0 points    Immunizations Immunization History  Administered Date(s) Administered   Fluad Quad(high Dose 65+) 05/22/2020, 05/31/2021, 05/22/2022   Fluad Trivalent(High Dose 65+) 06/17/2023   INFLUENZA, HIGH DOSE SEASONAL PF 05/15/2017, 05/11/2024   Moderna Covid-19 Vaccine Bivalent Booster 82yrs & up 05/31/2021   Moderna Sars-Covid-2 Vaccination 10/02/2019, 10/30/2019, 06/13/2020   Pneumococcal Conjugate-13 03/16/2014   Pneumococcal Polysaccharide-23 07/12/2015   Tdap 07/02/2022   Zoster Recombinant(Shingrix) 04/03/2022, 06/12/2022    TDAP status: Up to date  Flu Vaccine status: Up to date  Pneumococcal vaccine status: Up to date  Covid-19 vaccine status: Completed vaccines  Qualifies for Shingles Vaccine? Yes   Zostavax completed Yes   Shingrix Completed?: Yes  Screening Tests Health Maintenance  Topic Date  Due   COVID-19 Vaccine (5 - 2025-26 season) 04/19/2024   Medicare Annual Wellness (AWV)  06/17/2025   DTaP/Tdap/Td (2 - Td or Tdap) 07/02/2032   Pneumococcal Vaccine: 50+ Years  Completed   Influenza Vaccine  Completed   Zoster Vaccines- Shingrix  Completed   Meningococcal B Vaccine  Aged Out   Colonoscopy  Discontinued   Hepatitis C Screening  Discontinued    Health Maintenance  Health Maintenance Due  Topic Date Due   COVID-19 Vaccine (5 - 2025-26 season) 04/19/2024    Colorectal cancer screening: Type of screening: Colonoscopy. Completed 01/01/2012. Repeat every 10 years Discontinued  Lung Cancer Screening: (Low Dose CT Chest recommended if Age 75-80 years, 20 pack-year currently smoking OR have quit w/in 15years.) does not qualify.    Lung Cancer Screening Referral: N/A  Additional Screening:  Hepatitis C Screening: does not qualify; Completed n/a  Vision Screening: Recommended annual ophthalmology exams for early detection of glaucoma and other disorders of the eye. Is the patient up to date with their annual eye exam?  Yes  Who is the provider or what is the name of the office in which the patient attends annual eye exams? [facility]   Dental Screening: Recommended annual dental exams for proper oral hygiene  Diabetic Foot Exam: N/A  Community Resource Referral / Chronic Care Management: CRR required this visit?  No   CCM required this visit?  No     Plan:    Encounter for Medicare annual wellness exam Assessment & Plan: Up to date on screening and vaccines  Things to do to keep yourself healthy  - Exercise at least 30-45 minutes a day, 3-4 days a week.  - Eat a low-fat diet with lots of fruits and vegetables, up to 7-9 servings per day.  - Seatbelts can save your life. Wear them always.  - Smoke detectors on every level of your home, check batteries every year.  - Eye Doctor - have an eye exam every 1-2 years  - Safe sex - if you may be exposed to STDs, use a condom.  - Alcohol -  If you drink, do it moderately, less than 2 drinks per day.  - Health Care Power of Attorney. Choose someone to speak for you if you are not able.  - Depression is common in our stressful world.If you're feeling down or losing interest in things you normally enjoy, please come in for a visit.  - Violence - If anyone is threatening or hurting you, please call immediately.    Idiopathic peripheral neuropathy Assessment & Plan: Asked for lower dose to wean to lower dose on some days  Orders: -     Gabapentin ; Take 1 capsule (100 mg total) by mouth 3 (three) times daily.  Dispense: 90 capsule; Refill: 3    I have personally reviewed and noted the following in the patient's chart:   Medical and social  history Use of alcohol, tobacco or illicit drugs  Current medications and supplements including opioid prescriptions. Patient is not currently taking opioid prescriptions. Functional ability and status Nutritional status Physical activity Advanced directives List of other physicians Hospitalizations, surgeries, and ER visits in previous 12 months Vitals Screenings to include cognitive, depression, and falls Referrals and appointments  In addition, I have reviewed and discussed with patient certain preventive protocols, quality metrics, and best practice recommendations. A written personalized care plan for preventive services as well as general preventive health recommendations were provided to patient.  Harrie Cedar, FNP [facility] [phone]      06/17/2024   After Visit Summary: (In Person-Printed) AVS printed and given to the patient

## 2024-06-17 NOTE — Patient Instructions (Signed)
 Health Maintenance, Male  Adopting a healthy lifestyle and getting preventive care are important in promoting health and wellness. Ask your health care provider about:  The right schedule for you to have regular tests and exams.  Things you can do on your own to prevent diseases and keep yourself healthy.  What should I know about diet, weight, and exercise?  Eat a healthy diet    Eat a diet that includes plenty of vegetables, fruits, low-fat dairy products, and lean protein.  Do not eat a lot of foods that are high in solid fats, added sugars, or sodium.  Maintain a healthy weight  Body mass index (BMI) is a measurement that can be used to identify possible weight problems. It estimates body fat based on height and weight. Your health care provider can help determine your BMI and help you achieve or maintain a healthy weight.  Get regular exercise  Get regular exercise. This is one of the most important things you can do for your health. Most adults should:  Exercise for at least 150 minutes each week. The exercise should increase your heart rate and make you sweat (moderate-intensity exercise).  Do strengthening exercises at least twice a week. This is in addition to the moderate-intensity exercise.  Spend less time sitting. Even light physical activity can be beneficial.  Watch cholesterol and blood lipids  Have your blood tested for lipids and cholesterol at 78 years of age, then have this test every 5 years.  You may need to have your cholesterol levels checked more often if:  Your lipid or cholesterol levels are high.  You are older than 78 years of age.  You are at high risk for heart disease.  What should I know about cancer screening?  Many types of cancers can be detected early and may often be prevented. Depending on your health history and family history, you may need to have cancer screening at various ages. This may include screening for:  Colorectal cancer.  Prostate cancer.  Skin cancer.  Lung  cancer.  What should I know about heart disease, diabetes, and high blood pressure?  Blood pressure and heart disease  High blood pressure causes heart disease and increases the risk of stroke. This is more likely to develop in people who have high blood pressure readings or are overweight.  Talk with your health care provider about your target blood pressure readings.  Have your blood pressure checked:  Every 3-5 years if you are 24-52 years of age.  Every year if you are 3 years old or older.  If you are between the ages of 60 and 72 and are a current or former smoker, ask your health care provider if you should have a one-time screening for abdominal aortic aneurysm (AAA).  Diabetes  Have regular diabetes screenings. This checks your fasting blood sugar level. Have the screening done:  Once every three years after age 66 if you are at a normal weight and have a low risk for diabetes.  More often and at a younger age if you are overweight or have a high risk for diabetes.  What should I know about preventing infection?  Hepatitis B  If you have a higher risk for hepatitis B, you should be screened for this virus. Talk with your health care provider to find out if you are at risk for hepatitis B infection.  Hepatitis C  Blood testing is recommended for:  Everyone born from [birth year] through 1965.  Anyone  with known risk factors for hepatitis C.  Sexually transmitted infections (STIs)  You should be screened each year for STIs, including gonorrhea and chlamydia, if:  You are sexually active and are younger than 78 years of age.  You are older than 78 years of age and your health care provider tells you that you are at risk for this type of infection.  Your sexual activity has changed since you were last screened, and you are at increased risk for chlamydia or gonorrhea. Ask your health care provider if you are at risk.  Ask your health care provider about whether you are at high risk for HIV. Your health care provider  may recommend a prescription medicine to help prevent HIV infection. If you choose to take medicine to prevent HIV, you should first get tested for HIV. You should then be tested every 3 months for as long as you are taking the medicine.  Follow these instructions at home:  Alcohol use  Do not drink alcohol if your health care provider tells you not to drink.  If you drink alcohol:  Limit how much you have to 0-2 drinks a day.  Know how much alcohol is in your drink. In the [place] one drink equals one 12 oz bottle of beer (355 mL), one 5 oz glass of wine (148 mL), or one 1 oz glass of hard liquor (44 mL).  Lifestyle  Do not use any products that contain nicotine or tobacco. These products include cigarettes, chewing tobacco, and vaping devices, such as e-cigarettes. If you need help quitting, ask your health care provider.  Do not use street drugs.  Do not share needles.  Ask your health care provider for help if you need support or information about quitting drugs.  General instructions  Schedule regular health, dental, and eye exams.  Stay current with your vaccines.  Tell your health care provider if:  You often feel depressed.  You have ever been abused or do not feel safe at home.  Summary  Adopting a healthy lifestyle and getting preventive care are important in promoting health and wellness.  Follow your health care provider's instructions about healthy diet, exercising, and getting tested or screened for diseases.  Follow your health care provider's instructions on monitoring your cholesterol and blood pressure.  This information is not intended to replace advice given to you by your health care provider. Make sure you discuss any questions you have with your health care provider.  Document Revised: 12/25/2020 Document Reviewed: 12/25/2020  Elsevier Patient Education  2024 [employer].

## 2024-07-01 ENCOUNTER — Other Ambulatory Visit: Payer: Self-pay | Admitting: Family Medicine

## 2024-07-23 ENCOUNTER — Other Ambulatory Visit: Payer: Self-pay | Admitting: Family Medicine

## 2024-07-23 DIAGNOSIS — G609 Hereditary and idiopathic neuropathy, unspecified: Secondary | ICD-10-CM

## 2024-07-27 ENCOUNTER — Other Ambulatory Visit: Payer: Self-pay | Admitting: Family Medicine

## 2024-08-06 ENCOUNTER — Ambulatory Visit: Admitting: Family Medicine

## 2024-08-06 ENCOUNTER — Encounter: Payer: Self-pay | Admitting: Family Medicine

## 2024-08-06 VITALS — BP 124/76 | HR 80 | Temp 98.2°F | Ht 71.0 in | Wt 238.0 lb

## 2024-08-06 DIAGNOSIS — G609 Hereditary and idiopathic neuropathy, unspecified: Secondary | ICD-10-CM | POA: Diagnosis not present

## 2024-08-06 DIAGNOSIS — R7303 Prediabetes: Secondary | ICD-10-CM

## 2024-08-06 DIAGNOSIS — E782 Mixed hyperlipidemia: Secondary | ICD-10-CM

## 2024-08-06 DIAGNOSIS — I10 Essential (primary) hypertension: Secondary | ICD-10-CM

## 2024-08-06 LAB — POCT LIPID PANEL
HDL: 46
LDL: 91
Non-HDL: 116
TC: 162
TRG: 124

## 2024-08-06 LAB — POCT GLYCOSYLATED HEMOGLOBIN (HGB A1C): HbA1c POC (<> result, manual entry): 5.6 %

## 2024-08-06 MED ORDER — GABAPENTIN 100 MG PO CAPS: 100.0000 mg | ORAL_CAPSULE | Freq: Every day | ORAL | Status: AC | PRN

## 2024-08-06 NOTE — Assessment & Plan Note (Signed)
 Continue gabapentin  300 mg twice daily and uses gabapentin  100 mg once daily as needed.   Orders:   gabapentin  (NEURONTIN ) 100 MG capsule; Take 1 capsule (100 mg total) by mouth daily as needed.

## 2024-08-06 NOTE — Assessment & Plan Note (Addendum)
 Blood pressure well-controlled. - Continue amlodipine  5 mg daily. - Continue losartan  50 mg twice daily.

## 2024-08-06 NOTE — Assessment & Plan Note (Signed)
 A1c indicates well-controlled prediabetes. - Recommend continue to work on eating healthy diet and exercise. - Continue regular exercise, such as walking one to two miles daily.   Orders:   POCT glycosylated hemoglobin (Hb A1C)

## 2024-08-06 NOTE — Progress Notes (Signed)
 "  Subjective:  Patient ID: Joseph Mercado, male    DOB: 23-May-1946  Age: 78 y.o. MRN: [id number]  Chief Complaint  Patient presents with   Medical Management of Chronic Issues    HPI: Discussed the use of AI scribe software for clinical note transcription with the patient, who gave verbal consent to proceed.  History of Present Illness Joseph Mercado is a 78 year old male who presents for a routine follow-up visit.  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 - Gabapentin  300 mg twice daily at 5 AM and 5 PM for pain control - Gabapentin  100 mg as needed for breakthrough pain, typically once daily or less frequently  Hypertension and cardiovascular disease management - Amlodipine  5 mg once daily, isosorbide  mononitrate 60 mg once daily, and losartan  50 mg twice daily for blood pressure control - Pravastatin 50 mg every morning and Zetia  for cholesterol management  Gout management - Allopurinol for gout prophylaxis - No recent gout flare-ups  Gastrointestinal symptoms - Protonix  40 mg once daily for acid reflux - Senna daily for bowel regularity - Bowel movements are regular with current regimen - No abdominal pain  General review of systems - No fevers, chills, sweats, earaches, sore throat, stuffy nose, joint pain, back pain, muscle pain, bladder problems, headaches, dizziness, or depression       06/17/2024    1:53 PM 06/17/2023   10:06 AM 06/11/2022    9:23 AM 04/03/2022    8:01 AM 03/27/2021    8:00 AM  Depression screen PHQ 2/9  Decreased Interest 0 0 0 0 0  Down, Depressed, Hopeless 0 0 0 0 0  PHQ - 2 Score 0 0 0 0 0  Altered sleeping  0     Tired, decreased energy  0     Change in appetite  0     Feeling bad or failure about  yourself   0     Trouble concentrating  0     Moving slowly or fidgety/restless  0     Suicidal thoughts  0     PHQ-9 Score  0      Difficult doing work/chores  Not difficult at all        Data saved with a previous flowsheet row definition        06/17/2024    1:52 PM  Fall Risk   Falls in the past year? 0  Number falls in past yr: 0  Injury with Fall? 0   Risk for fall due to : Impaired balance/gait  Follow up Education provided;Falls evaluation completed     Data saved with a previous flowsheet row definition    Patient Care Team: Sherre Clapper, MD as PCP - General (Family Medicine) Bernie Lamar PARAS, MD as Consulting Physician (Cardiology) Lanis Fonda BRAVO, MD as Consulting Physician (Vascular Surgery) Kendell Marcey ORN, MD as Referring Physician (Family Medicine)   Review of Systems  Constitutional:  Negative for chills, fatigue and fever.  HENT:  Negative for congestion, ear pain and sore throat.   Respiratory:  Negative for cough and shortness of breath.   Cardiovascular:  Negative for chest pain.  Gastrointestinal:  Negative for abdominal pain, constipation, diarrhea, nausea and vomiting.  Genitourinary:  Negative for dysuria and frequency.  Musculoskeletal:  Negative for arthralgias and myalgias.  Neurological:  Negative for dizziness and headaches.  Psychiatric/Behavioral:  Positive for sleep disturbance. Negative for dysphoric mood.        No dysphoria    Medications Ordered Prior to Encounter[1] Past Medical History:  Diagnosis Date   Anemia 05/20/2017   CAD (coronary artery disease)    a. 02/2017: NSTEMI with cath showing 100% Prox Cx stenosis with 95% Mid Cx stenosis and 45% mid-LAD stenosis. Unsuccessful attempt at crossing an occluded mid nondominant circumflex --> plan for staged PCI in coming weeks   Chest pain 02/2017   Chronic gouty arthritis 10/10/2019   Class 2 severe obesity due to excess calories with serious comorbidity and body mass index  (BMI) of 37.0 to 37.9 in adult 04/10/2024   Cognitive deficit following cerebrovascular accident (CVA) 05/30/2022   Coronary artery disease of native artery of native heart with stable angina pectoris 10/07/2022   Dyspnea    Dyspnea on exertion 02/05/2017   Dyspnea on exertion     Elevated glucose level 07/28/2023   Gastroesophageal reflux disease without esophagitis 08/27/2021   GERD (gastroesophageal reflux disease)    High cholesterol    Hypertension    Hypertensive heart disease 03/11/2017   Idiopathic peripheral neuropathy 07/28/2023   Impaired balance as late effect of cerebrovascular accident (CVA) 10/07/2022   Impotence 10/10/2019   Late effect of cerebrovascular accident (CVA) 10/04/2022   Lumbar pain 04/08/2023   Macular degeneration    Medicare annual wellness visit, subsequent 06/17/2023   Mixed hyperlipidemia 03/11/2017   Morbid obesity (HCC) 03/27/2021   Myalgia, multiple sites 06/25/2023   Neuropathy 08/04/2023   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   Numbness and tingling of both lower extremities 06/25/2023   Obstructive chronic bronchitis without exacerbation (HCC) 10/10/2019   Pain in both lower legs 07/28/2023   Prediabetes 08/04/2023   Right-sided extracranial carotid artery stenosis 05/30/2022   Screening for lung cancer 04/08/2023   Senile purpura 03/27/2021   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   Vision changes 06/25/2023   Past Surgical History:  Procedure Laterality Date   CATARACT EXTRACTION W/ INTRAOCULAR LENS  IMPLANT, BILATERAL Bilateral    CORONARY ANGIOPLASTY     CORONARY BALLOON ANGIOPLASTY N/A 03/10/2017   Procedure: Coronary Balloon Angioplasty;  Surgeon: Court Dorn PARAS, MD;  Location: [facility] INVASIVE CV LAB;  Service: Cardiovascular;  Laterality: N/A;  CFX   CORONARY CTO INTERVENTION N/A 05/14/2017   Procedure: CORONARY CTO INTERVENTION;  Surgeon: Jordan, Peter M, MD;  Location: [facility] INVASIVE CV LAB;  Service:  Cardiovascular;  Laterality: N/A;   LEFT HEART CATH AND CORONARY ANGIOGRAPHY N/A 03/10/2017   Procedure: Left Heart Cath and Coronary Angiography;  Surgeon: Court Dorn PARAS, MD;  Location: [facility] INVASIVE CV LAB;  Service: Cardiovascular;  Laterality: N/A;   PERICARDIOCENTESIS N/A 05/14/2017   Procedure: PERICARDIOCENTESIS;  Surgeon: Jordan, Peter M, MD;  Location: [facility] INVASIVE CV LAB;  Service: Cardiovascular;  Laterality: N/A;   SUBXYPHOID PERICARDIAL WINDOW N/A 05/15/2017   Procedure: SUBXYPHOID PERICARDIAL WINDOW WITH DRAINAGE OF PERICARDIAL EFFUSION;  Surgeon: Lucas Dorise POUR, MD;  Location: [facility] OR;  Service: Thoracic;  Laterality: N/A;   ULTRASOUND GUIDANCE FOR VASCULAR ACCESS  05/14/2017   Procedure: Ultrasound Guidance For Vascular Access;  Surgeon: Jordan, Peter M, MD;  Location: [facility] INVASIVE CV LAB;  Service: Cardiovascular;;    Family History  Problem Relation Age of Onset   CAD Brother        CABG in his  91's   Social History   Socioeconomic History   Marital status: Divorced    Spouse name: Not on file   Number of children: Not on file   Years of education: Not on file   Highest education level: Not on file  Occupational History   Occupation: Retired Proofreader  Tobacco Use   Smoking status: Former    Current packs/day: 0.00    Average packs/day: 3.0 packs/day for 20.0 years (60.0 ttl pk-yrs)    Types: Cigarettes    Quit date: 1985    Years since quitting: 40.9   Smokeless tobacco: Former    Types: Chew   Tobacco comments:    quit smoking in the 1980s; chewed 4-5 years after I quit smoking  Vaping Use   Vaping status: Never Used  Substance and Sexual Activity   Alcohol use: Yes    Alcohol/week: 1.0 standard drink of alcohol    Types: 1 Cans of beer per week    Comment: 05/14/2017 6-10 beers/day  06/11/22 - 2016 cut down to three beers a day, has not had any alcohol in the past 6 weeks   Drug use: No   Sexual activity: Not on file  Other Topics Concern   Not on file   Social History Narrative   Not on file   Social Drivers of Health   Tobacco Use: Medium Risk (08/06/2024)   Patient History    Smoking Tobacco Use: Former    Smokeless Tobacco Use: Former    Passive Exposure: Not on Actuary Strain: Low Risk (06/17/2024)   Overall Financial Resource Strain (CARDIA)    Difficulty of Paying Living Expenses: Not hard at all  Food Insecurity: No Food Insecurity (06/17/2024)   Epic    Worried About Programme Researcher, Broadcasting/film/video in the Last Year: Never true    Ran Out of Food in the Last Year: Never true  Transportation Needs: No Transportation Needs (04/06/2024)   Epic    Lack of Transportation (Medical): No    Lack of Transportation (Non-Medical): No  Physical Activity: Sufficiently Active (06/17/2024)   Exercise Vital Sign    Days of Exercise per Week: 7 days    Minutes of Exercise per Session: 60 min  Stress: No Stress Concern Present (06/17/2024)   [employer] of Occupational Health - Occupational Stress Questionnaire    Feeling of Stress: Not at all  Social Connections: Moderately Integrated (04/07/2023)   Social Connection and Isolation Panel    Frequency of Communication with Friends and Family: More than three times a week    Frequency of Social Gatherings with Friends and Family: More than three times a week    Attends Religious Services: More than 4 times per year    Active Member of Clubs or Organizations: No    Attends Banker Meetings: Never    Marital Status: Married  Depression (PHQ2-9): Low Risk (06/17/2024)   Depression (PHQ2-9)    PHQ-2 Score: 0  Alcohol Screen: Low Risk (06/17/2024)   Alcohol Screen    Last Alcohol Screening Score (AUDIT): 0  Housing: Low Risk (06/17/2024)   Epic    Unable to Pay for Housing in the Last Year: No    Number of Times Moved in the Last Year: 0    Homeless in the Last Year: No  Utilities: Not At Risk (04/06/2024)   Epic    Threatened with loss of utilities: No  Health  Literacy: Adequate Health Literacy (06/17/2024)  B1300 Health Literacy    Frequency of need for help with medical instructions: Never    Objective:  BP 124/76   Pulse 80   Temp 98.2 F (36.8 C)   Ht 5' 11 (1.803 m)   Wt 238 lb (108 kg)   SpO2 95%   BMI 33.19 kg/m      08/06/2024    7:42 AM 06/17/2024    1:38 PM 05/18/2024    7:46 AM  BP/Weight  Systolic BP 124 120 138  Diastolic BP 76 64 84  Wt. (Lbs) 238 233 232  BMI 33.19 kg/m2 37.61 kg/m2 37.45 kg/m2    Physical Exam Vitals reviewed.  Constitutional:      Appearance: Normal appearance.  Neck:     Vascular: No carotid bruit.  Cardiovascular:     Rate and Rhythm: Normal rate and regular rhythm.     Heart sounds: Normal heart sounds.  Pulmonary:     Effort: Pulmonary effort is normal.     Breath sounds: Normal breath sounds. No wheezing, rhonchi or rales.  Abdominal:     General: Bowel sounds are normal.     Palpations: Abdomen is soft.     Tenderness: There is no abdominal tenderness.  Neurological:     Mental Status: He is alert and oriented to person, place, and time.  Psychiatric:        Mood and Affect: Mood normal.        Behavior: Behavior normal.         Lab Results  Component Value Date   WBC 8.7 04/02/2024   HGB 15.3 04/02/2024   HCT 47.3 04/02/2024   PLT 215 04/02/2024   GLUCOSE 92 04/02/2024   CHOL 171 04/02/2024   TRIG 116 04/02/2024   HDL 47 04/02/2024   LDLCALC 103 (H) 04/02/2024   ALT 22 04/02/2024   AST 26 04/02/2024   NA 134 04/02/2024   K 4.7 04/02/2024   CL 97 04/02/2024   CREATININE 1.04 04/02/2024   BUN 9 04/02/2024   CO2 22 04/02/2024   TSH 1.480 06/25/2023   INR 1.11 05/20/2017   HGBA1C 5.6 08/06/2024    Results for orders placed or performed in visit on 08/06/24  POCT glycosylated hemoglobin (Hb A1C)   Collection Time: 08/06/24  7:54 AM  Result Value Ref Range   Hemoglobin A1C     HbA1c POC (<> result, manual entry) 5.6 4.0 - 5.6 %   HbA1c, POC (prediabetic  range)     HbA1c, POC (controlled diabetic range)    POCT Lipid Panel   Collection Time: 08/06/24  7:59 AM  Result Value Ref Range   TC 162    HDL 46    TRG 124    LDL 91    Non-HDL 116    TC/HDL    .  Assessment & Plan:   Assessment & Plan Prediabetes A1c indicates well-controlled prediabetes. - Recommend continue to work on eating healthy diet and exercise. - Continue regular exercise, such as walking one to two miles daily.   Orders:   POCT glycosylated hemoglobin (Hb A1C)   Mixed hyperlipidemia Improved.  Continue pravastatin 20 mg before bed and zetia  10 mg daily.  Recommend continue to work on eating healthy diet and exercise.  Orders:   POCT Lipid Panel   CBC with Differential/Platelet   Comprehensive metabolic panel with GFR   Idiopathic peripheral neuropathy Continue gabapentin  300 mg twice daily and uses gabapentin  100 mg once daily as needed.  Orders:   gabapentin  (NEURONTIN ) 100 MG capsule; Take 1 capsule (100 mg total) by mouth daily as needed.   Primary hypertension Blood pressure well-controlled. - Continue amlodipine  5 mg daily. - Continue losartan  50 mg twice daily.       Body mass index is 33.19 kg/m.     Meds ordered this encounter  Medications   gabapentin  (NEURONTIN ) 100 MG capsule    Sig: Take 1 capsule (100 mg total) by mouth daily as needed.    Orders Placed This Encounter  Procedures   CBC with Differential/Platelet   Comprehensive metabolic panel with GFR   POCT glycosylated hemoglobin (Hb A1C)   POCT Lipid Panel       Follow-up: Return in about 6 months (around 02/04/2025) for chronic follow up.  An After Visit Summary was printed and given to the patient.  Abigail Free, MD [facility] [phone]    [1]  Current Outpatient Medications on File Prior to Visit  Medication Sig Dispense Refill   allopurinol (ZYLOPRIM) 100 MG tablet TAKE 1 TABLET(100 MG) BY MOUTH TWICE DAILY AS NEEDED FOR PAIN 180 tablet  1   amLODipine  (NORVASC ) 5 MG tablet TAKE 1 TABLET(5 MG) BY MOUTH DAILY 90 tablet 1   aspirin  EC 81 MG tablet Take 1 tablet (81 mg total) by mouth daily. Resume on 05/20/17. 30 tablet 12   ezetimibe  (ZETIA ) 10 MG tablet Take 10 mg by mouth daily.     gabapentin  (NEURONTIN ) 300 MG capsule TAKE 1 CAPSULE(300 MG) BY MOUTH TWICE DAILY 60 capsule 1   ibuprofen  (ADVIL ) 600 MG tablet Take 1 tablet (600 mg total) by mouth every 8 (eight) hours as needed. 90 tablet 1   isosorbide  mononitrate (IMDUR ) 60 MG 24 hr tablet TAKE 1 TABLET(60 MG) BY MOUTH DAILY 90 tablet 3   losartan  (COZAAR ) 50 MG tablet TAKE 1 TABLET(50 MG) BY MOUTH TWICE DAILY 180 tablet 1   nitroGLYCERIN  (NITROSTAT ) 0.4 MG SL tablet Place 1 tablet (0.4 mg total) under the tongue every 5 (five) minutes as needed for chest pain. 25 tablet 6   pantoprazole  (PROTONIX ) 40 MG tablet TAKE 1 TABLET(40 MG) BY MOUTH DAILY 90 tablet 1   pimecrolimus  (ELIDEL ) 1 % cream Apply topically 2 (two) times daily. 30 g 0   pravastatin (PRAVACHOL) 20 MG tablet TAKE 1 TABLET(20 MG) BY MOUTH DAILY 90 tablet 1   No current facility-administered medications on file prior to visit.   "

## 2024-08-06 NOTE — Assessment & Plan Note (Signed)
 Improved.  Continue pravastatin 20 mg before bed and zetia  10 mg daily.  Recommend continue to work on eating healthy diet and exercise.  Orders:   POCT Lipid Panel   CBC with Differential/Platelet   Comprehensive metabolic panel with GFR

## 2024-08-07 ENCOUNTER — Ambulatory Visit: Payer: Self-pay | Admitting: Family Medicine

## 2024-08-07 LAB — CBC WITH DIFFERENTIAL/PLATELET
Basophils Absolute: 0 x10E3/uL (ref 0.0–0.2)
Basos: 1 %
EOS (ABSOLUTE): 0.2 x10E3/uL (ref 0.0–0.4)
Eos: 2 %
Hematocrit: 45.8 % (ref 37.5–51.0)
Hemoglobin: 15.2 g/dL (ref 13.0–17.7)
Immature Grans (Abs): 0 x10E3/uL (ref 0.0–0.1)
Immature Granulocytes: 0 %
Lymphocytes Absolute: 2 x10E3/uL (ref 0.7–3.1)
Lymphs: 24 %
MCH: 31.3 pg (ref 26.6–33.0)
MCHC: 33.2 g/dL (ref 31.5–35.7)
MCV: 94 fL (ref 79–97)
Monocytes Absolute: 0.7 x10E3/uL (ref 0.1–0.9)
Monocytes: 9 %
Neutrophils Absolute: 5.3 x10E3/uL (ref 1.4–7.0)
Neutrophils: 64 %
Platelets: 233 x10E3/uL (ref 150–450)
RBC: 4.86 x10E6/uL (ref 4.14–5.80)
RDW: 13.1 % (ref 11.6–15.4)
WBC: 8.2 x10E3/uL (ref 3.4–10.8)

## 2024-08-07 LAB — COMPREHENSIVE METABOLIC PANEL WITH GFR
ALT: 22 IU/L (ref 0–44)
AST: 25 IU/L (ref 0–40)
Albumin: 4.1 g/dL (ref 3.8–4.8)
Alkaline Phosphatase: 99 IU/L (ref 47–123)
BUN/Creatinine Ratio: 9 — ABNORMAL LOW (ref 10–24)
BUN: 10 mg/dL (ref 8–27)
Bilirubin Total: 0.8 mg/dL (ref 0.0–1.2)
CO2: 22 mmol/L (ref 20–29)
Calcium: 9.3 mg/dL (ref 8.6–10.2)
Chloride: 94 mmol/L — ABNORMAL LOW (ref 96–106)
Creatinine, Ser: 1.12 mg/dL (ref 0.76–1.27)
Globulin, Total: 2.6 g/dL (ref 1.5–4.5)
Glucose: 88 mg/dL (ref 70–99)
Potassium: 5.1 mmol/L (ref 3.5–5.2)
Sodium: 131 mmol/L — ABNORMAL LOW (ref 134–144)
Total Protein: 6.7 g/dL (ref 6.0–8.5)
eGFR: 67 mL/min/1.73

## 2024-09-14 ENCOUNTER — Telehealth: Payer: Self-pay | Admitting: *Deleted

## 2024-09-14 MED ORDER — ISOSORBIDE MONONITRATE ER 60 MG PO TB24: 60.0000 mg | ORAL_TABLET | Freq: Every day | ORAL | 2 refills | Status: AC

## 2024-09-14 NOTE — Telephone Encounter (Signed)
 Rx refill sent to pharmacy.

## 2025-02-04 ENCOUNTER — Ambulatory Visit: Admitting: Family Medicine
